# Patient Record
Sex: Female | Born: 1945
Health system: Southern US, Community
[De-identification: ages and names within clinical notes are randomized; demographics above are authoritative.]

## PROBLEM LIST (undated history)

## (undated) DIAGNOSIS — G43709 Chronic migraine without aura, not intractable, without status migrainosus: Secondary | ICD-10-CM

## (undated) DIAGNOSIS — F32A Depression, unspecified: Secondary | ICD-10-CM

## (undated) DIAGNOSIS — G629 Polyneuropathy, unspecified: Secondary | ICD-10-CM

## (undated) DIAGNOSIS — E079 Disorder of thyroid, unspecified: Secondary | ICD-10-CM

## (undated) DIAGNOSIS — M1990 Unspecified osteoarthritis, unspecified site: Secondary | ICD-10-CM

## (undated) DIAGNOSIS — M797 Fibromyalgia: Secondary | ICD-10-CM

## (undated) DIAGNOSIS — I10 Essential (primary) hypertension: Secondary | ICD-10-CM

## (undated) HISTORY — DX: Depression, unspecified: F32.A

## (undated) HISTORY — DX: Chronic migraine without aura, not intractable, without status migrainosus: G43.709

## (undated) HISTORY — DX: Disorder of thyroid, unspecified: E07.9

## (undated) HISTORY — PX: ROBOTIC ASSISTED LAP VAGINAL HYSTERECTOMY: SHX2362

## (undated) HISTORY — PX: APPENDECTOMY: SHX54

## (undated) HISTORY — DX: Unspecified osteoarthritis, unspecified site: M19.90

## (undated) HISTORY — PX: FRACTURE SURGERY: SHX138

## (undated) HISTORY — PX: KNEE SURGERY: SHX244

## (undated) HISTORY — PX: CHOLECYSTECTOMY: SHX55

## (undated) HISTORY — DX: Fibromyalgia: M79.7

## (undated) HISTORY — DX: Polyneuropathy, unspecified: G62.9

## (undated) HISTORY — PX: CERVICAL DISC SURGERY: SHX588

## (undated) HISTORY — DX: Essential (primary) hypertension: I10

---

## 2009-01-16 ENCOUNTER — Ambulatory Visit: Payer: Self-pay | Admitting: Endocrinology

## 2009-01-16 DIAGNOSIS — M5137 Other intervertebral disc degeneration, lumbosacral region: Secondary | ICD-10-CM

## 2009-01-16 DIAGNOSIS — R61 Generalized hyperhidrosis: Secondary | ICD-10-CM | POA: Insufficient documentation

## 2009-01-16 DIAGNOSIS — I10 Essential (primary) hypertension: Secondary | ICD-10-CM | POA: Insufficient documentation

## 2009-01-16 DIAGNOSIS — K219 Gastro-esophageal reflux disease without esophagitis: Secondary | ICD-10-CM | POA: Insufficient documentation

## 2009-01-16 DIAGNOSIS — F329 Major depressive disorder, single episode, unspecified: Secondary | ICD-10-CM

## 2009-01-16 DIAGNOSIS — F3289 Other specified depressive episodes: Secondary | ICD-10-CM

## 2009-01-16 DIAGNOSIS — F32A Depression, unspecified: Secondary | ICD-10-CM | POA: Insufficient documentation

## 2009-01-16 DIAGNOSIS — J309 Allergic rhinitis, unspecified: Secondary | ICD-10-CM

## 2009-01-16 DIAGNOSIS — R609 Edema, unspecified: Secondary | ICD-10-CM | POA: Insufficient documentation

## 2009-01-16 DIAGNOSIS — E876 Hypokalemia: Secondary | ICD-10-CM | POA: Insufficient documentation

## 2009-01-16 DIAGNOSIS — E039 Hypothyroidism, unspecified: Secondary | ICD-10-CM | POA: Insufficient documentation

## 2009-01-16 DIAGNOSIS — N959 Unspecified menopausal and perimenopausal disorder: Secondary | ICD-10-CM

## 2009-01-16 DIAGNOSIS — R413 Other amnesia: Secondary | ICD-10-CM | POA: Insufficient documentation

## 2009-01-16 DIAGNOSIS — M51379 Other intervertebral disc degeneration, lumbosacral region without mention of lumbar back pain or lower extremity pain: Secondary | ICD-10-CM

## 2009-01-16 HISTORY — DX: Other specified depressive episodes: F32.89

## 2009-01-16 HISTORY — DX: Other intervertebral disc degeneration, lumbosacral region without mention of lumbar back pain or lower extremity pain: M51.379

## 2009-01-16 HISTORY — DX: Generalized hyperhidrosis: R61

## 2009-01-16 HISTORY — DX: Edema, unspecified: R60.9

## 2009-01-16 HISTORY — DX: Hypothyroidism, unspecified: E03.9

## 2009-01-16 HISTORY — DX: Allergic rhinitis, unspecified: J30.9

## 2009-01-16 HISTORY — DX: Gastro-esophageal reflux disease without esophagitis: K21.9

## 2009-01-16 HISTORY — DX: Other intervertebral disc degeneration, lumbosacral region: M51.37

## 2009-01-16 HISTORY — DX: Hypokalemia: E87.6

## 2009-01-16 HISTORY — DX: Essential (primary) hypertension: I10

## 2009-01-16 HISTORY — DX: Other amnesia: R41.3

## 2009-01-16 HISTORY — DX: Unspecified menopausal and perimenopausal disorder: N95.9

## 2009-01-16 HISTORY — DX: Major depressive disorder, single episode, unspecified: F32.9

## 2009-01-16 LAB — CONVERTED CEMR LAB: TSH: 2.37 microintl units/mL (ref 0.35–5.50)

## 2009-01-21 ENCOUNTER — Encounter: Payer: Self-pay | Admitting: Endocrinology

## 2009-01-27 LAB — CONVERTED CEMR LAB
Metaneph Total, Ur: 248 ug/24hr (ref 224–832)
Normetanephrine, 24H Ur: 222 (ref 122–676)

## 2010-08-25 DIAGNOSIS — Z79899 Other long term (current) drug therapy: Secondary | ICD-10-CM

## 2010-08-25 DIAGNOSIS — G2581 Restless legs syndrome: Secondary | ICD-10-CM

## 2010-08-25 DIAGNOSIS — Z5181 Encounter for therapeutic drug level monitoring: Secondary | ICD-10-CM

## 2010-08-25 DIAGNOSIS — M797 Fibromyalgia: Secondary | ICD-10-CM | POA: Insufficient documentation

## 2010-08-25 HISTORY — DX: Restless legs syndrome: G25.81

## 2010-08-25 HISTORY — DX: Other long term (current) drug therapy: Z79.899

## 2010-08-25 HISTORY — DX: Encounter for therapeutic drug level monitoring: Z51.81

## 2010-10-09 DIAGNOSIS — M47817 Spondylosis without myelopathy or radiculopathy, lumbosacral region: Secondary | ICD-10-CM

## 2010-10-09 HISTORY — DX: Spondylosis without myelopathy or radiculopathy, lumbosacral region: M47.817

## 2010-11-20 DIAGNOSIS — M47814 Spondylosis without myelopathy or radiculopathy, thoracic region: Secondary | ICD-10-CM

## 2010-11-20 HISTORY — DX: Spondylosis without myelopathy or radiculopathy, thoracic region: M47.814

## 2012-02-23 DIAGNOSIS — Z79899 Other long term (current) drug therapy: Secondary | ICD-10-CM | POA: Diagnosis not present

## 2012-02-23 DIAGNOSIS — E039 Hypothyroidism, unspecified: Secondary | ICD-10-CM | POA: Diagnosis not present

## 2012-02-23 DIAGNOSIS — I10 Essential (primary) hypertension: Secondary | ICD-10-CM | POA: Diagnosis not present

## 2012-02-23 DIAGNOSIS — J309 Allergic rhinitis, unspecified: Secondary | ICD-10-CM | POA: Diagnosis not present

## 2012-02-23 DIAGNOSIS — E785 Hyperlipidemia, unspecified: Secondary | ICD-10-CM | POA: Diagnosis not present

## 2012-02-23 DIAGNOSIS — Z6830 Body mass index (BMI) 30.0-30.9, adult: Secondary | ICD-10-CM | POA: Diagnosis not present

## 2012-02-23 DIAGNOSIS — Z23 Encounter for immunization: Secondary | ICD-10-CM | POA: Diagnosis not present

## 2012-02-23 DIAGNOSIS — M1710 Unilateral primary osteoarthritis, unspecified knee: Secondary | ICD-10-CM | POA: Diagnosis not present

## 2012-03-08 DIAGNOSIS — IMO0001 Reserved for inherently not codable concepts without codable children: Secondary | ICD-10-CM | POA: Diagnosis not present

## 2012-03-08 DIAGNOSIS — M961 Postlaminectomy syndrome, not elsewhere classified: Secondary | ICD-10-CM | POA: Diagnosis not present

## 2012-03-08 DIAGNOSIS — M5137 Other intervertebral disc degeneration, lumbosacral region: Secondary | ICD-10-CM | POA: Diagnosis not present

## 2012-03-08 DIAGNOSIS — IMO0002 Reserved for concepts with insufficient information to code with codable children: Secondary | ICD-10-CM | POA: Diagnosis not present

## 2012-03-21 DIAGNOSIS — Z6830 Body mass index (BMI) 30.0-30.9, adult: Secondary | ICD-10-CM | POA: Diagnosis not present

## 2012-03-21 DIAGNOSIS — J019 Acute sinusitis, unspecified: Secondary | ICD-10-CM | POA: Diagnosis not present

## 2012-03-21 DIAGNOSIS — E871 Hypo-osmolality and hyponatremia: Secondary | ICD-10-CM | POA: Diagnosis not present

## 2012-03-21 DIAGNOSIS — J029 Acute pharyngitis, unspecified: Secondary | ICD-10-CM | POA: Diagnosis not present

## 2012-04-03 DIAGNOSIS — M1710 Unilateral primary osteoarthritis, unspecified knee: Secondary | ICD-10-CM | POA: Diagnosis not present

## 2012-04-03 DIAGNOSIS — IMO0002 Reserved for concepts with insufficient information to code with codable children: Secondary | ICD-10-CM | POA: Diagnosis not present

## 2012-04-03 DIAGNOSIS — J309 Allergic rhinitis, unspecified: Secondary | ICD-10-CM | POA: Diagnosis not present

## 2012-04-03 DIAGNOSIS — R609 Edema, unspecified: Secondary | ICD-10-CM | POA: Diagnosis not present

## 2012-04-03 DIAGNOSIS — Z6830 Body mass index (BMI) 30.0-30.9, adult: Secondary | ICD-10-CM | POA: Diagnosis not present

## 2012-04-20 DIAGNOSIS — M1710 Unilateral primary osteoarthritis, unspecified knee: Secondary | ICD-10-CM | POA: Diagnosis not present

## 2012-04-20 DIAGNOSIS — F411 Generalized anxiety disorder: Secondary | ICD-10-CM | POA: Diagnosis not present

## 2012-04-20 DIAGNOSIS — Z6831 Body mass index (BMI) 31.0-31.9, adult: Secondary | ICD-10-CM | POA: Diagnosis not present

## 2012-04-26 DIAGNOSIS — M2540 Effusion, unspecified joint: Secondary | ICD-10-CM | POA: Diagnosis not present

## 2012-04-26 DIAGNOSIS — S83289A Other tear of lateral meniscus, current injury, unspecified knee, initial encounter: Secondary | ICD-10-CM | POA: Diagnosis not present

## 2012-04-26 DIAGNOSIS — M1710 Unilateral primary osteoarthritis, unspecified knee: Secondary | ICD-10-CM | POA: Diagnosis not present

## 2012-04-26 DIAGNOSIS — IMO0002 Reserved for concepts with insufficient information to code with codable children: Secondary | ICD-10-CM | POA: Diagnosis not present

## 2012-05-10 DIAGNOSIS — J019 Acute sinusitis, unspecified: Secondary | ICD-10-CM | POA: Diagnosis not present

## 2012-05-10 DIAGNOSIS — F39 Unspecified mood [affective] disorder: Secondary | ICD-10-CM | POA: Diagnosis not present

## 2012-05-10 DIAGNOSIS — Z6831 Body mass index (BMI) 31.0-31.9, adult: Secondary | ICD-10-CM | POA: Diagnosis not present

## 2012-05-10 DIAGNOSIS — M1710 Unilateral primary osteoarthritis, unspecified knee: Secondary | ICD-10-CM | POA: Diagnosis not present

## 2012-05-10 DIAGNOSIS — M2550 Pain in unspecified joint: Secondary | ICD-10-CM | POA: Diagnosis not present

## 2012-05-24 DIAGNOSIS — Z01810 Encounter for preprocedural cardiovascular examination: Secondary | ICD-10-CM | POA: Diagnosis not present

## 2012-06-06 DIAGNOSIS — M25469 Effusion, unspecified knee: Secondary | ICD-10-CM | POA: Diagnosis not present

## 2012-06-06 DIAGNOSIS — R609 Edema, unspecified: Secondary | ICD-10-CM | POA: Diagnosis not present

## 2012-06-06 DIAGNOSIS — Z6831 Body mass index (BMI) 31.0-31.9, adult: Secondary | ICD-10-CM | POA: Diagnosis not present

## 2012-06-06 DIAGNOSIS — M1710 Unilateral primary osteoarthritis, unspecified knee: Secondary | ICD-10-CM | POA: Diagnosis not present

## 2012-06-06 DIAGNOSIS — M25569 Pain in unspecified knee: Secondary | ICD-10-CM | POA: Diagnosis not present

## 2012-06-06 DIAGNOSIS — IMO0002 Reserved for concepts with insufficient information to code with codable children: Secondary | ICD-10-CM | POA: Diagnosis not present

## 2012-06-13 DIAGNOSIS — M1710 Unilateral primary osteoarthritis, unspecified knee: Secondary | ICD-10-CM | POA: Diagnosis not present

## 2012-06-19 DIAGNOSIS — M25469 Effusion, unspecified knee: Secondary | ICD-10-CM | POA: Diagnosis not present

## 2012-06-19 DIAGNOSIS — IMO0002 Reserved for concepts with insufficient information to code with codable children: Secondary | ICD-10-CM | POA: Diagnosis not present

## 2012-06-19 DIAGNOSIS — M1710 Unilateral primary osteoarthritis, unspecified knee: Secondary | ICD-10-CM | POA: Diagnosis not present

## 2012-06-19 DIAGNOSIS — M25569 Pain in unspecified knee: Secondary | ICD-10-CM | POA: Diagnosis not present

## 2012-06-19 DIAGNOSIS — M7120 Synovial cyst of popliteal space [Baker], unspecified knee: Secondary | ICD-10-CM | POA: Diagnosis not present

## 2012-06-19 DIAGNOSIS — S83289A Other tear of lateral meniscus, current injury, unspecified knee, initial encounter: Secondary | ICD-10-CM | POA: Diagnosis not present

## 2012-06-20 DIAGNOSIS — IMO0001 Reserved for inherently not codable concepts without codable children: Secondary | ICD-10-CM | POA: Diagnosis not present

## 2012-06-20 DIAGNOSIS — M1710 Unilateral primary osteoarthritis, unspecified knee: Secondary | ICD-10-CM | POA: Diagnosis not present

## 2012-06-20 DIAGNOSIS — M79609 Pain in unspecified limb: Secondary | ICD-10-CM

## 2012-06-20 DIAGNOSIS — M51379 Other intervertebral disc degeneration, lumbosacral region without mention of lumbar back pain or lower extremity pain: Secondary | ICD-10-CM | POA: Diagnosis not present

## 2012-06-20 DIAGNOSIS — IMO0002 Reserved for concepts with insufficient information to code with codable children: Secondary | ICD-10-CM | POA: Diagnosis not present

## 2012-06-20 DIAGNOSIS — M5137 Other intervertebral disc degeneration, lumbosacral region: Secondary | ICD-10-CM | POA: Diagnosis not present

## 2012-06-20 HISTORY — DX: Pain in unspecified limb: M79.609

## 2012-07-10 DIAGNOSIS — Z01810 Encounter for preprocedural cardiovascular examination: Secondary | ICD-10-CM | POA: Diagnosis not present

## 2012-07-11 DIAGNOSIS — Z01818 Encounter for other preprocedural examination: Secondary | ICD-10-CM | POA: Diagnosis not present

## 2012-07-11 DIAGNOSIS — R238 Other skin changes: Secondary | ICD-10-CM | POA: Diagnosis not present

## 2012-07-21 DIAGNOSIS — E871 Hypo-osmolality and hyponatremia: Secondary | ICD-10-CM | POA: Diagnosis not present

## 2012-08-01 DIAGNOSIS — J209 Acute bronchitis, unspecified: Secondary | ICD-10-CM | POA: Diagnosis not present

## 2012-08-01 DIAGNOSIS — Z6833 Body mass index (BMI) 33.0-33.9, adult: Secondary | ICD-10-CM | POA: Diagnosis not present

## 2012-08-01 DIAGNOSIS — J029 Acute pharyngitis, unspecified: Secondary | ICD-10-CM | POA: Diagnosis not present

## 2012-08-01 DIAGNOSIS — J019 Acute sinusitis, unspecified: Secondary | ICD-10-CM | POA: Diagnosis not present

## 2012-08-01 DIAGNOSIS — R7989 Other specified abnormal findings of blood chemistry: Secondary | ICD-10-CM | POA: Diagnosis not present

## 2012-08-14 DIAGNOSIS — M1710 Unilateral primary osteoarthritis, unspecified knee: Secondary | ICD-10-CM | POA: Diagnosis not present

## 2012-08-14 DIAGNOSIS — Z01818 Encounter for other preprocedural examination: Secondary | ICD-10-CM | POA: Diagnosis not present

## 2012-08-18 DIAGNOSIS — M5137 Other intervertebral disc degeneration, lumbosacral region: Secondary | ICD-10-CM | POA: Diagnosis not present

## 2012-08-18 DIAGNOSIS — IMO0001 Reserved for inherently not codable concepts without codable children: Secondary | ICD-10-CM | POA: Diagnosis not present

## 2012-08-18 DIAGNOSIS — I10 Essential (primary) hypertension: Secondary | ICD-10-CM | POA: Diagnosis not present

## 2012-08-18 DIAGNOSIS — IMO0002 Reserved for concepts with insufficient information to code with codable children: Secondary | ICD-10-CM | POA: Diagnosis not present

## 2012-08-18 DIAGNOSIS — M961 Postlaminectomy syndrome, not elsewhere classified: Secondary | ICD-10-CM | POA: Diagnosis not present

## 2012-08-22 DIAGNOSIS — R799 Abnormal finding of blood chemistry, unspecified: Secondary | ICD-10-CM | POA: Diagnosis not present

## 2012-08-28 DIAGNOSIS — E039 Hypothyroidism, unspecified: Secondary | ICD-10-CM | POA: Diagnosis present

## 2012-08-28 DIAGNOSIS — Z90710 Acquired absence of both cervix and uterus: Secondary | ICD-10-CM | POA: Diagnosis not present

## 2012-08-28 DIAGNOSIS — G8929 Other chronic pain: Secondary | ICD-10-CM | POA: Diagnosis present

## 2012-08-28 DIAGNOSIS — M1710 Unilateral primary osteoarthritis, unspecified knee: Secondary | ICD-10-CM | POA: Diagnosis not present

## 2012-08-28 DIAGNOSIS — E663 Overweight: Secondary | ICD-10-CM | POA: Diagnosis present

## 2012-08-28 DIAGNOSIS — Z87891 Personal history of nicotine dependence: Secondary | ICD-10-CM | POA: Diagnosis not present

## 2012-08-28 DIAGNOSIS — Z8544 Personal history of malignant neoplasm of other female genital organs: Secondary | ICD-10-CM | POA: Diagnosis not present

## 2012-08-28 DIAGNOSIS — Z885 Allergy status to narcotic agent status: Secondary | ICD-10-CM | POA: Diagnosis not present

## 2012-08-28 DIAGNOSIS — IMO0002 Reserved for concepts with insufficient information to code with codable children: Secondary | ICD-10-CM | POA: Diagnosis not present

## 2012-08-28 DIAGNOSIS — Z8042 Family history of malignant neoplasm of prostate: Secondary | ICD-10-CM | POA: Diagnosis not present

## 2012-08-28 DIAGNOSIS — Z981 Arthrodesis status: Secondary | ICD-10-CM | POA: Diagnosis not present

## 2012-08-28 DIAGNOSIS — Z791 Long term (current) use of non-steroidal anti-inflammatories (NSAID): Secondary | ICD-10-CM | POA: Diagnosis not present

## 2012-08-28 DIAGNOSIS — Z23 Encounter for immunization: Secondary | ICD-10-CM | POA: Diagnosis not present

## 2012-08-28 DIAGNOSIS — G2581 Restless legs syndrome: Secondary | ICD-10-CM | POA: Diagnosis present

## 2012-08-28 DIAGNOSIS — Z6833 Body mass index (BMI) 33.0-33.9, adult: Secondary | ICD-10-CM | POA: Diagnosis not present

## 2012-08-28 DIAGNOSIS — Z86010 Personal history of colonic polyps: Secondary | ICD-10-CM | POA: Diagnosis not present

## 2012-08-28 DIAGNOSIS — I10 Essential (primary) hypertension: Secondary | ICD-10-CM | POA: Diagnosis present

## 2012-08-28 DIAGNOSIS — IMO0001 Reserved for inherently not codable concepts without codable children: Secondary | ICD-10-CM | POA: Diagnosis present

## 2012-08-31 DIAGNOSIS — Z86010 Personal history of colonic polyps: Secondary | ICD-10-CM | POA: Diagnosis not present

## 2012-08-31 DIAGNOSIS — G2581 Restless legs syndrome: Secondary | ICD-10-CM | POA: Diagnosis not present

## 2012-08-31 DIAGNOSIS — Z981 Arthrodesis status: Secondary | ICD-10-CM | POA: Diagnosis not present

## 2012-08-31 DIAGNOSIS — Z23 Encounter for immunization: Secondary | ICD-10-CM | POA: Diagnosis not present

## 2012-08-31 DIAGNOSIS — Z885 Allergy status to narcotic agent status: Secondary | ICD-10-CM | POA: Diagnosis not present

## 2012-08-31 DIAGNOSIS — Z8544 Personal history of malignant neoplasm of other female genital organs: Secondary | ICD-10-CM | POA: Diagnosis not present

## 2012-08-31 DIAGNOSIS — Z8042 Family history of malignant neoplasm of prostate: Secondary | ICD-10-CM | POA: Diagnosis not present

## 2012-08-31 DIAGNOSIS — IMO0001 Reserved for inherently not codable concepts without codable children: Secondary | ICD-10-CM | POA: Diagnosis not present

## 2012-08-31 DIAGNOSIS — E663 Overweight: Secondary | ICD-10-CM | POA: Diagnosis not present

## 2012-08-31 DIAGNOSIS — Z90710 Acquired absence of both cervix and uterus: Secondary | ICD-10-CM | POA: Diagnosis not present

## 2012-08-31 DIAGNOSIS — Z791 Long term (current) use of non-steroidal anti-inflammatories (NSAID): Secondary | ICD-10-CM | POA: Diagnosis not present

## 2012-08-31 DIAGNOSIS — Z87891 Personal history of nicotine dependence: Secondary | ICD-10-CM | POA: Diagnosis not present

## 2012-08-31 DIAGNOSIS — I10 Essential (primary) hypertension: Secondary | ICD-10-CM | POA: Diagnosis not present

## 2012-08-31 DIAGNOSIS — IMO0002 Reserved for concepts with insufficient information to code with codable children: Secondary | ICD-10-CM | POA: Diagnosis not present

## 2012-08-31 DIAGNOSIS — E039 Hypothyroidism, unspecified: Secondary | ICD-10-CM | POA: Diagnosis not present

## 2012-08-31 DIAGNOSIS — Z6833 Body mass index (BMI) 33.0-33.9, adult: Secondary | ICD-10-CM | POA: Diagnosis not present

## 2012-08-31 DIAGNOSIS — G8929 Other chronic pain: Secondary | ICD-10-CM | POA: Diagnosis not present

## 2012-09-01 DIAGNOSIS — IMO0001 Reserved for inherently not codable concepts without codable children: Secondary | ICD-10-CM | POA: Diagnosis not present

## 2012-09-01 DIAGNOSIS — Z7901 Long term (current) use of anticoagulants: Secondary | ICD-10-CM | POA: Diagnosis not present

## 2012-09-01 DIAGNOSIS — Z96659 Presence of unspecified artificial knee joint: Secondary | ICD-10-CM | POA: Diagnosis not present

## 2012-09-01 DIAGNOSIS — Z471 Aftercare following joint replacement surgery: Secondary | ICD-10-CM | POA: Diagnosis not present

## 2012-09-01 DIAGNOSIS — Z5181 Encounter for therapeutic drug level monitoring: Secondary | ICD-10-CM | POA: Diagnosis not present

## 2012-09-01 DIAGNOSIS — I10 Essential (primary) hypertension: Secondary | ICD-10-CM | POA: Diagnosis not present

## 2012-09-02 DIAGNOSIS — Z96659 Presence of unspecified artificial knee joint: Secondary | ICD-10-CM | POA: Diagnosis not present

## 2012-09-02 DIAGNOSIS — I10 Essential (primary) hypertension: Secondary | ICD-10-CM | POA: Diagnosis not present

## 2012-09-02 DIAGNOSIS — Z7901 Long term (current) use of anticoagulants: Secondary | ICD-10-CM | POA: Diagnosis not present

## 2012-09-02 DIAGNOSIS — Z5181 Encounter for therapeutic drug level monitoring: Secondary | ICD-10-CM | POA: Diagnosis not present

## 2012-09-02 DIAGNOSIS — Z471 Aftercare following joint replacement surgery: Secondary | ICD-10-CM | POA: Diagnosis not present

## 2012-09-02 DIAGNOSIS — IMO0001 Reserved for inherently not codable concepts without codable children: Secondary | ICD-10-CM | POA: Diagnosis not present

## 2012-09-03 DIAGNOSIS — Z96659 Presence of unspecified artificial knee joint: Secondary | ICD-10-CM | POA: Diagnosis not present

## 2012-09-03 DIAGNOSIS — I10 Essential (primary) hypertension: Secondary | ICD-10-CM | POA: Diagnosis not present

## 2012-09-03 DIAGNOSIS — Z5181 Encounter for therapeutic drug level monitoring: Secondary | ICD-10-CM | POA: Diagnosis not present

## 2012-09-03 DIAGNOSIS — IMO0001 Reserved for inherently not codable concepts without codable children: Secondary | ICD-10-CM | POA: Diagnosis not present

## 2012-09-03 DIAGNOSIS — Z471 Aftercare following joint replacement surgery: Secondary | ICD-10-CM | POA: Diagnosis not present

## 2012-09-03 DIAGNOSIS — Z7901 Long term (current) use of anticoagulants: Secondary | ICD-10-CM | POA: Diagnosis not present

## 2012-09-04 DIAGNOSIS — Z96659 Presence of unspecified artificial knee joint: Secondary | ICD-10-CM | POA: Diagnosis not present

## 2012-09-04 DIAGNOSIS — I10 Essential (primary) hypertension: Secondary | ICD-10-CM | POA: Diagnosis not present

## 2012-09-04 DIAGNOSIS — Z471 Aftercare following joint replacement surgery: Secondary | ICD-10-CM | POA: Diagnosis not present

## 2012-09-04 DIAGNOSIS — Z5181 Encounter for therapeutic drug level monitoring: Secondary | ICD-10-CM | POA: Diagnosis not present

## 2012-09-04 DIAGNOSIS — Z7901 Long term (current) use of anticoagulants: Secondary | ICD-10-CM | POA: Diagnosis not present

## 2012-09-04 DIAGNOSIS — IMO0001 Reserved for inherently not codable concepts without codable children: Secondary | ICD-10-CM | POA: Diagnosis not present

## 2012-09-05 DIAGNOSIS — Z471 Aftercare following joint replacement surgery: Secondary | ICD-10-CM | POA: Diagnosis not present

## 2012-09-05 DIAGNOSIS — Z96659 Presence of unspecified artificial knee joint: Secondary | ICD-10-CM | POA: Diagnosis not present

## 2012-09-05 DIAGNOSIS — Z7901 Long term (current) use of anticoagulants: Secondary | ICD-10-CM | POA: Diagnosis not present

## 2012-09-05 DIAGNOSIS — I10 Essential (primary) hypertension: Secondary | ICD-10-CM | POA: Diagnosis not present

## 2012-09-05 DIAGNOSIS — Z5181 Encounter for therapeutic drug level monitoring: Secondary | ICD-10-CM | POA: Diagnosis not present

## 2012-09-05 DIAGNOSIS — IMO0001 Reserved for inherently not codable concepts without codable children: Secondary | ICD-10-CM | POA: Diagnosis not present

## 2012-09-06 DIAGNOSIS — Z96659 Presence of unspecified artificial knee joint: Secondary | ICD-10-CM | POA: Diagnosis not present

## 2012-09-06 DIAGNOSIS — Z471 Aftercare following joint replacement surgery: Secondary | ICD-10-CM | POA: Diagnosis not present

## 2012-09-06 DIAGNOSIS — IMO0001 Reserved for inherently not codable concepts without codable children: Secondary | ICD-10-CM | POA: Diagnosis not present

## 2012-09-06 DIAGNOSIS — Z7901 Long term (current) use of anticoagulants: Secondary | ICD-10-CM | POA: Diagnosis not present

## 2012-09-06 DIAGNOSIS — I10 Essential (primary) hypertension: Secondary | ICD-10-CM | POA: Diagnosis not present

## 2012-09-06 DIAGNOSIS — Z5181 Encounter for therapeutic drug level monitoring: Secondary | ICD-10-CM | POA: Diagnosis not present

## 2012-09-07 DIAGNOSIS — Z5181 Encounter for therapeutic drug level monitoring: Secondary | ICD-10-CM | POA: Diagnosis not present

## 2012-09-07 DIAGNOSIS — Z471 Aftercare following joint replacement surgery: Secondary | ICD-10-CM | POA: Diagnosis not present

## 2012-09-07 DIAGNOSIS — I10 Essential (primary) hypertension: Secondary | ICD-10-CM | POA: Diagnosis not present

## 2012-09-07 DIAGNOSIS — Z7901 Long term (current) use of anticoagulants: Secondary | ICD-10-CM | POA: Diagnosis not present

## 2012-09-07 DIAGNOSIS — IMO0001 Reserved for inherently not codable concepts without codable children: Secondary | ICD-10-CM | POA: Diagnosis not present

## 2012-09-07 DIAGNOSIS — Z96659 Presence of unspecified artificial knee joint: Secondary | ICD-10-CM | POA: Diagnosis not present

## 2012-09-08 DIAGNOSIS — Z7901 Long term (current) use of anticoagulants: Secondary | ICD-10-CM | POA: Diagnosis not present

## 2012-09-08 DIAGNOSIS — Z5181 Encounter for therapeutic drug level monitoring: Secondary | ICD-10-CM | POA: Diagnosis not present

## 2012-09-08 DIAGNOSIS — Z96659 Presence of unspecified artificial knee joint: Secondary | ICD-10-CM | POA: Diagnosis not present

## 2012-09-08 DIAGNOSIS — IMO0001 Reserved for inherently not codable concepts without codable children: Secondary | ICD-10-CM | POA: Diagnosis not present

## 2012-09-08 DIAGNOSIS — I10 Essential (primary) hypertension: Secondary | ICD-10-CM | POA: Diagnosis not present

## 2012-09-08 DIAGNOSIS — Z471 Aftercare following joint replacement surgery: Secondary | ICD-10-CM | POA: Diagnosis not present

## 2012-09-09 DIAGNOSIS — M1990 Unspecified osteoarthritis, unspecified site: Secondary | ICD-10-CM | POA: Diagnosis not present

## 2012-09-09 DIAGNOSIS — R5382 Chronic fatigue, unspecified: Secondary | ICD-10-CM | POA: Diagnosis not present

## 2012-09-09 DIAGNOSIS — G8929 Other chronic pain: Secondary | ICD-10-CM | POA: Diagnosis present

## 2012-09-09 DIAGNOSIS — M79609 Pain in unspecified limb: Secondary | ICD-10-CM | POA: Diagnosis not present

## 2012-09-09 DIAGNOSIS — M25569 Pain in unspecified knee: Secondary | ICD-10-CM | POA: Diagnosis not present

## 2012-09-09 DIAGNOSIS — IMO0001 Reserved for inherently not codable concepts without codable children: Secondary | ICD-10-CM | POA: Diagnosis not present

## 2012-09-09 DIAGNOSIS — Z5189 Encounter for other specified aftercare: Secondary | ICD-10-CM | POA: Diagnosis not present

## 2012-09-09 DIAGNOSIS — Z79899 Other long term (current) drug therapy: Secondary | ICD-10-CM | POA: Diagnosis not present

## 2012-09-09 DIAGNOSIS — R52 Pain, unspecified: Secondary | ICD-10-CM | POA: Diagnosis not present

## 2012-09-09 DIAGNOSIS — I10 Essential (primary) hypertension: Secondary | ICD-10-CM | POA: Diagnosis not present

## 2012-09-09 DIAGNOSIS — R269 Unspecified abnormalities of gait and mobility: Secondary | ICD-10-CM | POA: Diagnosis not present

## 2012-09-09 DIAGNOSIS — I499 Cardiac arrhythmia, unspecified: Secondary | ICD-10-CM | POA: Diagnosis present

## 2012-09-09 DIAGNOSIS — Z885 Allergy status to narcotic agent status: Secondary | ICD-10-CM | POA: Diagnosis not present

## 2012-09-09 DIAGNOSIS — G8918 Other acute postprocedural pain: Secondary | ICD-10-CM | POA: Diagnosis not present

## 2012-09-09 DIAGNOSIS — I749 Embolism and thrombosis of unspecified artery: Secondary | ICD-10-CM | POA: Diagnosis not present

## 2012-09-09 DIAGNOSIS — D696 Thrombocytopenia, unspecified: Secondary | ICD-10-CM | POA: Diagnosis not present

## 2012-09-09 DIAGNOSIS — E039 Hypothyroidism, unspecified: Secondary | ICD-10-CM | POA: Diagnosis present

## 2012-09-09 DIAGNOSIS — R262 Difficulty in walking, not elsewhere classified: Secondary | ICD-10-CM | POA: Diagnosis not present

## 2012-09-09 DIAGNOSIS — M1710 Unilateral primary osteoarthritis, unspecified knee: Secondary | ICD-10-CM | POA: Diagnosis present

## 2012-09-09 DIAGNOSIS — Z981 Arthrodesis status: Secondary | ICD-10-CM | POA: Diagnosis not present

## 2012-09-09 DIAGNOSIS — Z7901 Long term (current) use of anticoagulants: Secondary | ICD-10-CM | POA: Diagnosis not present

## 2012-09-09 DIAGNOSIS — Z90710 Acquired absence of both cervix and uterus: Secondary | ICD-10-CM | POA: Diagnosis not present

## 2012-09-09 DIAGNOSIS — M545 Low back pain: Secondary | ICD-10-CM | POA: Diagnosis not present

## 2012-09-09 DIAGNOSIS — I824Z9 Acute embolism and thrombosis of unspecified deep veins of unspecified distal lower extremity: Secondary | ICD-10-CM | POA: Diagnosis not present

## 2012-09-09 DIAGNOSIS — Z96659 Presence of unspecified artificial knee joint: Secondary | ICD-10-CM | POA: Diagnosis not present

## 2012-09-09 DIAGNOSIS — I82409 Acute embolism and thrombosis of unspecified deep veins of unspecified lower extremity: Secondary | ICD-10-CM | POA: Diagnosis not present

## 2012-09-09 DIAGNOSIS — M6281 Muscle weakness (generalized): Secondary | ICD-10-CM | POA: Diagnosis not present

## 2012-09-12 DIAGNOSIS — I10 Essential (primary) hypertension: Secondary | ICD-10-CM | POA: Diagnosis not present

## 2012-09-12 DIAGNOSIS — R5382 Chronic fatigue, unspecified: Secondary | ICD-10-CM | POA: Diagnosis not present

## 2012-09-12 DIAGNOSIS — F329 Major depressive disorder, single episode, unspecified: Secondary | ICD-10-CM | POA: Diagnosis not present

## 2012-09-12 DIAGNOSIS — M79609 Pain in unspecified limb: Secondary | ICD-10-CM | POA: Diagnosis not present

## 2012-09-12 DIAGNOSIS — M6281 Muscle weakness (generalized): Secondary | ICD-10-CM | POA: Diagnosis not present

## 2012-09-12 DIAGNOSIS — I499 Cardiac arrhythmia, unspecified: Secondary | ICD-10-CM | POA: Diagnosis not present

## 2012-09-12 DIAGNOSIS — D696 Thrombocytopenia, unspecified: Secondary | ICD-10-CM | POA: Diagnosis not present

## 2012-09-12 DIAGNOSIS — G8929 Other chronic pain: Secondary | ICD-10-CM | POA: Diagnosis not present

## 2012-09-12 DIAGNOSIS — R262 Difficulty in walking, not elsewhere classified: Secondary | ICD-10-CM | POA: Diagnosis not present

## 2012-09-12 DIAGNOSIS — IMO0001 Reserved for inherently not codable concepts without codable children: Secondary | ICD-10-CM | POA: Diagnosis not present

## 2012-09-12 DIAGNOSIS — R269 Unspecified abnormalities of gait and mobility: Secondary | ICD-10-CM | POA: Diagnosis not present

## 2012-09-12 DIAGNOSIS — M545 Low back pain: Secondary | ICD-10-CM | POA: Diagnosis not present

## 2012-09-12 DIAGNOSIS — Z96659 Presence of unspecified artificial knee joint: Secondary | ICD-10-CM | POA: Diagnosis not present

## 2012-09-12 DIAGNOSIS — E782 Mixed hyperlipidemia: Secondary | ICD-10-CM | POA: Diagnosis not present

## 2012-09-12 DIAGNOSIS — E039 Hypothyroidism, unspecified: Secondary | ICD-10-CM | POA: Diagnosis not present

## 2012-09-12 DIAGNOSIS — Z7901 Long term (current) use of anticoagulants: Secondary | ICD-10-CM | POA: Diagnosis not present

## 2012-09-12 DIAGNOSIS — I824Z9 Acute embolism and thrombosis of unspecified deep veins of unspecified distal lower extremity: Secondary | ICD-10-CM | POA: Diagnosis not present

## 2012-09-12 DIAGNOSIS — M25569 Pain in unspecified knee: Secondary | ICD-10-CM | POA: Diagnosis not present

## 2012-09-12 DIAGNOSIS — Z79899 Other long term (current) drug therapy: Secondary | ICD-10-CM | POA: Diagnosis not present

## 2012-09-12 DIAGNOSIS — M1990 Unspecified osteoarthritis, unspecified site: Secondary | ICD-10-CM | POA: Diagnosis not present

## 2012-09-12 DIAGNOSIS — Z5189 Encounter for other specified aftercare: Secondary | ICD-10-CM | POA: Diagnosis not present

## 2012-09-12 DIAGNOSIS — I749 Embolism and thrombosis of unspecified artery: Secondary | ICD-10-CM | POA: Diagnosis not present

## 2012-09-14 DIAGNOSIS — E782 Mixed hyperlipidemia: Secondary | ICD-10-CM | POA: Diagnosis not present

## 2012-09-14 DIAGNOSIS — I10 Essential (primary) hypertension: Secondary | ICD-10-CM | POA: Diagnosis not present

## 2012-09-14 DIAGNOSIS — F329 Major depressive disorder, single episode, unspecified: Secondary | ICD-10-CM | POA: Diagnosis not present

## 2012-09-21 DIAGNOSIS — IMO0001 Reserved for inherently not codable concepts without codable children: Secondary | ICD-10-CM | POA: Diagnosis not present

## 2012-09-21 DIAGNOSIS — Z471 Aftercare following joint replacement surgery: Secondary | ICD-10-CM | POA: Diagnosis not present

## 2012-09-21 DIAGNOSIS — Z5181 Encounter for therapeutic drug level monitoring: Secondary | ICD-10-CM | POA: Diagnosis not present

## 2012-09-21 DIAGNOSIS — Z96659 Presence of unspecified artificial knee joint: Secondary | ICD-10-CM | POA: Diagnosis not present

## 2012-09-21 DIAGNOSIS — I10 Essential (primary) hypertension: Secondary | ICD-10-CM | POA: Diagnosis not present

## 2012-09-21 DIAGNOSIS — Z7901 Long term (current) use of anticoagulants: Secondary | ICD-10-CM | POA: Diagnosis not present

## 2012-09-22 DIAGNOSIS — I10 Essential (primary) hypertension: Secondary | ICD-10-CM | POA: Diagnosis not present

## 2012-09-22 DIAGNOSIS — Z7901 Long term (current) use of anticoagulants: Secondary | ICD-10-CM | POA: Diagnosis not present

## 2012-09-22 DIAGNOSIS — IMO0001 Reserved for inherently not codable concepts without codable children: Secondary | ICD-10-CM | POA: Diagnosis not present

## 2012-09-22 DIAGNOSIS — Z471 Aftercare following joint replacement surgery: Secondary | ICD-10-CM | POA: Diagnosis not present

## 2012-09-22 DIAGNOSIS — Z96659 Presence of unspecified artificial knee joint: Secondary | ICD-10-CM | POA: Diagnosis not present

## 2012-09-22 DIAGNOSIS — Z5181 Encounter for therapeutic drug level monitoring: Secondary | ICD-10-CM | POA: Diagnosis not present

## 2012-09-24 DIAGNOSIS — I10 Essential (primary) hypertension: Secondary | ICD-10-CM | POA: Diagnosis not present

## 2012-09-24 DIAGNOSIS — Z96659 Presence of unspecified artificial knee joint: Secondary | ICD-10-CM | POA: Diagnosis not present

## 2012-09-24 DIAGNOSIS — IMO0001 Reserved for inherently not codable concepts without codable children: Secondary | ICD-10-CM | POA: Diagnosis not present

## 2012-09-24 DIAGNOSIS — Z5181 Encounter for therapeutic drug level monitoring: Secondary | ICD-10-CM | POA: Diagnosis not present

## 2012-09-24 DIAGNOSIS — Z7901 Long term (current) use of anticoagulants: Secondary | ICD-10-CM | POA: Diagnosis not present

## 2012-09-24 DIAGNOSIS — Z471 Aftercare following joint replacement surgery: Secondary | ICD-10-CM | POA: Diagnosis not present

## 2012-09-26 DIAGNOSIS — Z7901 Long term (current) use of anticoagulants: Secondary | ICD-10-CM | POA: Diagnosis not present

## 2012-09-26 DIAGNOSIS — Z471 Aftercare following joint replacement surgery: Secondary | ICD-10-CM | POA: Diagnosis not present

## 2012-09-26 DIAGNOSIS — Z5181 Encounter for therapeutic drug level monitoring: Secondary | ICD-10-CM | POA: Diagnosis not present

## 2012-09-26 DIAGNOSIS — Z96659 Presence of unspecified artificial knee joint: Secondary | ICD-10-CM | POA: Diagnosis not present

## 2012-09-26 DIAGNOSIS — I10 Essential (primary) hypertension: Secondary | ICD-10-CM | POA: Diagnosis not present

## 2012-09-26 DIAGNOSIS — IMO0001 Reserved for inherently not codable concepts without codable children: Secondary | ICD-10-CM | POA: Diagnosis not present

## 2012-09-27 DIAGNOSIS — I10 Essential (primary) hypertension: Secondary | ICD-10-CM | POA: Diagnosis not present

## 2012-09-27 DIAGNOSIS — Z471 Aftercare following joint replacement surgery: Secondary | ICD-10-CM | POA: Diagnosis not present

## 2012-09-27 DIAGNOSIS — Z5181 Encounter for therapeutic drug level monitoring: Secondary | ICD-10-CM | POA: Diagnosis not present

## 2012-09-27 DIAGNOSIS — IMO0001 Reserved for inherently not codable concepts without codable children: Secondary | ICD-10-CM | POA: Diagnosis not present

## 2012-09-27 DIAGNOSIS — Z7901 Long term (current) use of anticoagulants: Secondary | ICD-10-CM | POA: Diagnosis not present

## 2012-09-27 DIAGNOSIS — Z96659 Presence of unspecified artificial knee joint: Secondary | ICD-10-CM | POA: Diagnosis not present

## 2012-10-02 DIAGNOSIS — R269 Unspecified abnormalities of gait and mobility: Secondary | ICD-10-CM | POA: Diagnosis not present

## 2012-10-02 DIAGNOSIS — M6281 Muscle weakness (generalized): Secondary | ICD-10-CM | POA: Diagnosis not present

## 2012-10-02 DIAGNOSIS — M1710 Unilateral primary osteoarthritis, unspecified knee: Secondary | ICD-10-CM | POA: Diagnosis not present

## 2012-10-03 DIAGNOSIS — D473 Essential (hemorrhagic) thrombocythemia: Secondary | ICD-10-CM | POA: Diagnosis not present

## 2012-10-03 DIAGNOSIS — I10 Essential (primary) hypertension: Secondary | ICD-10-CM | POA: Diagnosis not present

## 2012-10-03 DIAGNOSIS — Z7901 Long term (current) use of anticoagulants: Secondary | ICD-10-CM | POA: Diagnosis not present

## 2012-10-05 DIAGNOSIS — M1710 Unilateral primary osteoarthritis, unspecified knee: Secondary | ICD-10-CM | POA: Diagnosis not present

## 2012-10-11 DIAGNOSIS — R791 Abnormal coagulation profile: Secondary | ICD-10-CM | POA: Diagnosis not present

## 2012-10-11 DIAGNOSIS — I82409 Acute embolism and thrombosis of unspecified deep veins of unspecified lower extremity: Secondary | ICD-10-CM | POA: Diagnosis not present

## 2012-10-11 DIAGNOSIS — D473 Essential (hemorrhagic) thrombocythemia: Secondary | ICD-10-CM | POA: Diagnosis not present

## 2012-10-17 DIAGNOSIS — Z1231 Encounter for screening mammogram for malignant neoplasm of breast: Secondary | ICD-10-CM | POA: Diagnosis not present

## 2012-10-19 DIAGNOSIS — R791 Abnormal coagulation profile: Secondary | ICD-10-CM | POA: Diagnosis not present

## 2012-10-25 DIAGNOSIS — M1710 Unilateral primary osteoarthritis, unspecified knee: Secondary | ICD-10-CM | POA: Diagnosis not present

## 2012-10-25 DIAGNOSIS — M6281 Muscle weakness (generalized): Secondary | ICD-10-CM | POA: Diagnosis not present

## 2012-10-25 DIAGNOSIS — R269 Unspecified abnormalities of gait and mobility: Secondary | ICD-10-CM | POA: Diagnosis not present

## 2012-10-30 DIAGNOSIS — M1710 Unilateral primary osteoarthritis, unspecified knee: Secondary | ICD-10-CM | POA: Diagnosis not present

## 2012-10-30 DIAGNOSIS — R269 Unspecified abnormalities of gait and mobility: Secondary | ICD-10-CM | POA: Diagnosis not present

## 2012-10-30 DIAGNOSIS — M6281 Muscle weakness (generalized): Secondary | ICD-10-CM | POA: Diagnosis not present

## 2012-11-13 DIAGNOSIS — IMO0001 Reserved for inherently not codable concepts without codable children: Secondary | ICD-10-CM | POA: Diagnosis not present

## 2012-11-13 DIAGNOSIS — IMO0002 Reserved for concepts with insufficient information to code with codable children: Secondary | ICD-10-CM | POA: Diagnosis not present

## 2012-11-13 DIAGNOSIS — M961 Postlaminectomy syndrome, not elsewhere classified: Secondary | ICD-10-CM | POA: Diagnosis not present

## 2012-11-13 DIAGNOSIS — M5137 Other intervertebral disc degeneration, lumbosacral region: Secondary | ICD-10-CM | POA: Diagnosis not present

## 2012-11-13 DIAGNOSIS — M51379 Other intervertebral disc degeneration, lumbosacral region without mention of lumbar back pain or lower extremity pain: Secondary | ICD-10-CM | POA: Diagnosis not present

## 2012-11-21 DIAGNOSIS — M1710 Unilateral primary osteoarthritis, unspecified knee: Secondary | ICD-10-CM | POA: Diagnosis not present

## 2012-11-21 DIAGNOSIS — M6281 Muscle weakness (generalized): Secondary | ICD-10-CM | POA: Diagnosis not present

## 2012-11-21 DIAGNOSIS — R269 Unspecified abnormalities of gait and mobility: Secondary | ICD-10-CM | POA: Diagnosis not present

## 2012-11-23 DIAGNOSIS — E538 Deficiency of other specified B group vitamins: Secondary | ICD-10-CM | POA: Diagnosis not present

## 2012-11-23 DIAGNOSIS — Z0000 Encounter for general adult medical examination without abnormal findings: Secondary | ICD-10-CM | POA: Diagnosis not present

## 2012-11-23 DIAGNOSIS — I10 Essential (primary) hypertension: Secondary | ICD-10-CM | POA: Diagnosis not present

## 2012-11-23 DIAGNOSIS — Z79899 Other long term (current) drug therapy: Secondary | ICD-10-CM | POA: Diagnosis not present

## 2012-11-23 DIAGNOSIS — R791 Abnormal coagulation profile: Secondary | ICD-10-CM | POA: Diagnosis not present

## 2012-11-23 DIAGNOSIS — E559 Vitamin D deficiency, unspecified: Secondary | ICD-10-CM | POA: Diagnosis not present

## 2012-11-23 DIAGNOSIS — I82409 Acute embolism and thrombosis of unspecified deep veins of unspecified lower extremity: Secondary | ICD-10-CM | POA: Diagnosis not present

## 2012-11-24 DIAGNOSIS — R269 Unspecified abnormalities of gait and mobility: Secondary | ICD-10-CM | POA: Diagnosis not present

## 2012-11-24 DIAGNOSIS — M6281 Muscle weakness (generalized): Secondary | ICD-10-CM | POA: Diagnosis not present

## 2012-11-24 DIAGNOSIS — M1710 Unilateral primary osteoarthritis, unspecified knee: Secondary | ICD-10-CM | POA: Diagnosis not present

## 2012-11-27 DIAGNOSIS — M1710 Unilateral primary osteoarthritis, unspecified knee: Secondary | ICD-10-CM | POA: Diagnosis not present

## 2012-12-04 DIAGNOSIS — H68009 Unspecified Eustachian salpingitis, unspecified ear: Secondary | ICD-10-CM | POA: Diagnosis not present

## 2012-12-04 DIAGNOSIS — M722 Plantar fascial fibromatosis: Secondary | ICD-10-CM | POA: Diagnosis not present

## 2012-12-04 DIAGNOSIS — R5381 Other malaise: Secondary | ICD-10-CM | POA: Diagnosis not present

## 2012-12-04 DIAGNOSIS — J309 Allergic rhinitis, unspecified: Secondary | ICD-10-CM | POA: Diagnosis not present

## 2012-12-04 DIAGNOSIS — R110 Nausea: Secondary | ICD-10-CM | POA: Diagnosis not present

## 2012-12-07 DIAGNOSIS — M2010 Hallux valgus (acquired), unspecified foot: Secondary | ICD-10-CM | POA: Diagnosis not present

## 2012-12-07 DIAGNOSIS — M722 Plantar fascial fibromatosis: Secondary | ICD-10-CM | POA: Diagnosis not present

## 2013-01-10 DIAGNOSIS — R1310 Dysphagia, unspecified: Secondary | ICD-10-CM | POA: Diagnosis not present

## 2013-01-10 DIAGNOSIS — Z86010 Personal history of colonic polyps: Secondary | ICD-10-CM | POA: Diagnosis not present

## 2013-01-10 DIAGNOSIS — K5900 Constipation, unspecified: Secondary | ICD-10-CM | POA: Diagnosis not present

## 2013-01-10 DIAGNOSIS — K219 Gastro-esophageal reflux disease without esophagitis: Secondary | ICD-10-CM | POA: Diagnosis not present

## 2013-01-23 DIAGNOSIS — K449 Diaphragmatic hernia without obstruction or gangrene: Secondary | ICD-10-CM | POA: Diagnosis not present

## 2013-01-23 DIAGNOSIS — IMO0001 Reserved for inherently not codable concepts without codable children: Secondary | ICD-10-CM | POA: Diagnosis not present

## 2013-01-23 DIAGNOSIS — Z860100 Personal history of colon polyps, unspecified: Secondary | ICD-10-CM | POA: Diagnosis not present

## 2013-01-23 DIAGNOSIS — D126 Benign neoplasm of colon, unspecified: Secondary | ICD-10-CM | POA: Diagnosis not present

## 2013-01-23 DIAGNOSIS — Z8542 Personal history of malignant neoplasm of other parts of uterus: Secondary | ICD-10-CM | POA: Diagnosis not present

## 2013-01-23 DIAGNOSIS — K648 Other hemorrhoids: Secondary | ICD-10-CM | POA: Diagnosis not present

## 2013-01-23 DIAGNOSIS — I10 Essential (primary) hypertension: Secondary | ICD-10-CM | POA: Diagnosis not present

## 2013-01-23 DIAGNOSIS — Z1211 Encounter for screening for malignant neoplasm of colon: Secondary | ICD-10-CM | POA: Diagnosis not present

## 2013-01-23 DIAGNOSIS — K3189 Other diseases of stomach and duodenum: Secondary | ICD-10-CM | POA: Diagnosis not present

## 2013-01-23 DIAGNOSIS — R1310 Dysphagia, unspecified: Secondary | ICD-10-CM | POA: Diagnosis not present

## 2013-01-23 DIAGNOSIS — E079 Disorder of thyroid, unspecified: Secondary | ICD-10-CM | POA: Diagnosis not present

## 2013-02-01 DIAGNOSIS — J309 Allergic rhinitis, unspecified: Secondary | ICD-10-CM | POA: Diagnosis not present

## 2013-02-01 DIAGNOSIS — Z23 Encounter for immunization: Secondary | ICD-10-CM | POA: Diagnosis not present

## 2013-02-01 DIAGNOSIS — Z9181 History of falling: Secondary | ICD-10-CM | POA: Diagnosis not present

## 2013-02-01 DIAGNOSIS — M79609 Pain in unspecified limb: Secondary | ICD-10-CM | POA: Diagnosis not present

## 2013-02-15 DIAGNOSIS — Z9889 Other specified postprocedural states: Secondary | ICD-10-CM | POA: Diagnosis not present

## 2013-02-15 DIAGNOSIS — R112 Nausea with vomiting, unspecified: Secondary | ICD-10-CM | POA: Diagnosis not present

## 2013-02-15 DIAGNOSIS — N951 Menopausal and female climacteric states: Secondary | ICD-10-CM | POA: Diagnosis not present

## 2013-02-15 DIAGNOSIS — K449 Diaphragmatic hernia without obstruction or gangrene: Secondary | ICD-10-CM | POA: Diagnosis not present

## 2013-02-16 DIAGNOSIS — IMO0001 Reserved for inherently not codable concepts without codable children: Secondary | ICD-10-CM | POA: Diagnosis not present

## 2013-02-16 DIAGNOSIS — M961 Postlaminectomy syndrome, not elsewhere classified: Secondary | ICD-10-CM | POA: Diagnosis not present

## 2013-02-16 DIAGNOSIS — IMO0002 Reserved for concepts with insufficient information to code with codable children: Secondary | ICD-10-CM | POA: Diagnosis not present

## 2013-02-16 DIAGNOSIS — M546 Pain in thoracic spine: Secondary | ICD-10-CM | POA: Diagnosis not present

## 2013-02-16 DIAGNOSIS — M5137 Other intervertebral disc degeneration, lumbosacral region: Secondary | ICD-10-CM | POA: Diagnosis not present

## 2013-02-16 DIAGNOSIS — M533 Sacrococcygeal disorders, not elsewhere classified: Secondary | ICD-10-CM | POA: Diagnosis not present

## 2013-02-26 DIAGNOSIS — R1310 Dysphagia, unspecified: Secondary | ICD-10-CM | POA: Diagnosis not present

## 2013-02-26 DIAGNOSIS — K219 Gastro-esophageal reflux disease without esophagitis: Secondary | ICD-10-CM | POA: Diagnosis not present

## 2013-02-26 DIAGNOSIS — Z86010 Personal history of colonic polyps: Secondary | ICD-10-CM | POA: Diagnosis not present

## 2013-02-26 DIAGNOSIS — K5900 Constipation, unspecified: Secondary | ICD-10-CM | POA: Diagnosis not present

## 2013-03-05 DIAGNOSIS — K222 Esophageal obstruction: Secondary | ICD-10-CM | POA: Diagnosis not present

## 2013-03-05 DIAGNOSIS — K449 Diaphragmatic hernia without obstruction or gangrene: Secondary | ICD-10-CM | POA: Diagnosis not present

## 2013-03-05 DIAGNOSIS — R1310 Dysphagia, unspecified: Secondary | ICD-10-CM | POA: Diagnosis not present

## 2013-03-13 DIAGNOSIS — K5900 Constipation, unspecified: Secondary | ICD-10-CM | POA: Diagnosis not present

## 2013-03-13 DIAGNOSIS — R110 Nausea: Secondary | ICD-10-CM | POA: Diagnosis not present

## 2013-03-27 DIAGNOSIS — S20219A Contusion of unspecified front wall of thorax, initial encounter: Secondary | ICD-10-CM | POA: Diagnosis not present

## 2013-03-27 DIAGNOSIS — R071 Chest pain on breathing: Secondary | ICD-10-CM | POA: Diagnosis not present

## 2013-03-27 DIAGNOSIS — W010XXA Fall on same level from slipping, tripping and stumbling without subsequent striking against object, initial encounter: Secondary | ICD-10-CM | POA: Diagnosis not present

## 2013-03-27 DIAGNOSIS — S298XXA Other specified injuries of thorax, initial encounter: Secondary | ICD-10-CM | POA: Diagnosis not present

## 2013-03-28 DIAGNOSIS — Z86010 Personal history of colonic polyps: Secondary | ICD-10-CM | POA: Diagnosis not present

## 2013-03-28 DIAGNOSIS — R1310 Dysphagia, unspecified: Secondary | ICD-10-CM | POA: Diagnosis not present

## 2013-03-28 DIAGNOSIS — K5900 Constipation, unspecified: Secondary | ICD-10-CM | POA: Diagnosis not present

## 2013-03-28 DIAGNOSIS — K219 Gastro-esophageal reflux disease without esophagitis: Secondary | ICD-10-CM | POA: Diagnosis not present

## 2013-04-02 DIAGNOSIS — J019 Acute sinusitis, unspecified: Secondary | ICD-10-CM | POA: Diagnosis not present

## 2013-04-02 DIAGNOSIS — K219 Gastro-esophageal reflux disease without esophagitis: Secondary | ICD-10-CM | POA: Diagnosis not present

## 2013-04-02 DIAGNOSIS — J309 Allergic rhinitis, unspecified: Secondary | ICD-10-CM | POA: Diagnosis not present

## 2013-04-02 DIAGNOSIS — I10 Essential (primary) hypertension: Secondary | ICD-10-CM | POA: Diagnosis not present

## 2013-05-07 DIAGNOSIS — M1710 Unilateral primary osteoarthritis, unspecified knee: Secondary | ICD-10-CM | POA: Diagnosis not present

## 2013-05-07 DIAGNOSIS — N760 Acute vaginitis: Secondary | ICD-10-CM | POA: Diagnosis not present

## 2013-05-07 DIAGNOSIS — IMO0002 Reserved for concepts with insufficient information to code with codable children: Secondary | ICD-10-CM | POA: Diagnosis not present

## 2013-05-18 DIAGNOSIS — M546 Pain in thoracic spine: Secondary | ICD-10-CM | POA: Diagnosis not present

## 2013-05-18 DIAGNOSIS — IMO0001 Reserved for inherently not codable concepts without codable children: Secondary | ICD-10-CM | POA: Diagnosis not present

## 2013-05-18 DIAGNOSIS — M533 Sacrococcygeal disorders, not elsewhere classified: Secondary | ICD-10-CM | POA: Diagnosis not present

## 2013-05-18 DIAGNOSIS — M5137 Other intervertebral disc degeneration, lumbosacral region: Secondary | ICD-10-CM | POA: Diagnosis not present

## 2013-05-18 DIAGNOSIS — IMO0002 Reserved for concepts with insufficient information to code with codable children: Secondary | ICD-10-CM | POA: Diagnosis not present

## 2013-05-18 DIAGNOSIS — M51379 Other intervertebral disc degeneration, lumbosacral region without mention of lumbar back pain or lower extremity pain: Secondary | ICD-10-CM | POA: Diagnosis not present

## 2013-07-05 DIAGNOSIS — E039 Hypothyroidism, unspecified: Secondary | ICD-10-CM | POA: Diagnosis not present

## 2013-07-05 DIAGNOSIS — E038 Other specified hypothyroidism: Secondary | ICD-10-CM | POA: Diagnosis not present

## 2013-07-05 DIAGNOSIS — Z79899 Other long term (current) drug therapy: Secondary | ICD-10-CM | POA: Diagnosis not present

## 2013-07-05 DIAGNOSIS — I10 Essential (primary) hypertension: Secondary | ICD-10-CM | POA: Diagnosis not present

## 2013-07-05 DIAGNOSIS — E782 Mixed hyperlipidemia: Secondary | ICD-10-CM | POA: Diagnosis not present

## 2013-07-05 DIAGNOSIS — E559 Vitamin D deficiency, unspecified: Secondary | ICD-10-CM | POA: Diagnosis not present

## 2013-07-05 DIAGNOSIS — J309 Allergic rhinitis, unspecified: Secondary | ICD-10-CM | POA: Diagnosis not present

## 2013-07-09 DIAGNOSIS — M5137 Other intervertebral disc degeneration, lumbosacral region: Secondary | ICD-10-CM | POA: Diagnosis not present

## 2013-07-09 DIAGNOSIS — IMO0001 Reserved for inherently not codable concepts without codable children: Secondary | ICD-10-CM | POA: Diagnosis not present

## 2013-07-09 DIAGNOSIS — M129 Arthropathy, unspecified: Secondary | ICD-10-CM | POA: Diagnosis not present

## 2013-07-09 DIAGNOSIS — M533 Sacrococcygeal disorders, not elsewhere classified: Secondary | ICD-10-CM | POA: Diagnosis not present

## 2013-07-09 DIAGNOSIS — M546 Pain in thoracic spine: Secondary | ICD-10-CM | POA: Diagnosis not present

## 2013-07-16 DIAGNOSIS — M1710 Unilateral primary osteoarthritis, unspecified knee: Secondary | ICD-10-CM | POA: Diagnosis not present

## 2013-07-16 DIAGNOSIS — IMO0002 Reserved for concepts with insufficient information to code with codable children: Secondary | ICD-10-CM | POA: Diagnosis not present

## 2013-07-16 DIAGNOSIS — N23 Unspecified renal colic: Secondary | ICD-10-CM | POA: Diagnosis not present

## 2013-07-16 DIAGNOSIS — N949 Unspecified condition associated with female genital organs and menstrual cycle: Secondary | ICD-10-CM | POA: Diagnosis not present

## 2013-07-16 DIAGNOSIS — N343 Urethral syndrome, unspecified: Secondary | ICD-10-CM | POA: Diagnosis not present

## 2013-07-25 DIAGNOSIS — N23 Unspecified renal colic: Secondary | ICD-10-CM | POA: Diagnosis not present

## 2013-07-25 DIAGNOSIS — N949 Unspecified condition associated with female genital organs and menstrual cycle: Secondary | ICD-10-CM | POA: Diagnosis not present

## 2013-07-30 DIAGNOSIS — M797 Fibromyalgia: Secondary | ICD-10-CM | POA: Insufficient documentation

## 2013-07-30 DIAGNOSIS — M533 Sacrococcygeal disorders, not elsewhere classified: Secondary | ICD-10-CM | POA: Diagnosis not present

## 2013-07-30 DIAGNOSIS — M546 Pain in thoracic spine: Secondary | ICD-10-CM

## 2013-07-30 DIAGNOSIS — M961 Postlaminectomy syndrome, not elsewhere classified: Secondary | ICD-10-CM | POA: Insufficient documentation

## 2013-07-30 DIAGNOSIS — M1990 Unspecified osteoarthritis, unspecified site: Secondary | ICD-10-CM | POA: Insufficient documentation

## 2013-07-30 DIAGNOSIS — M0580 Other rheumatoid arthritis with rheumatoid factor of unspecified site: Secondary | ICD-10-CM | POA: Insufficient documentation

## 2013-07-30 DIAGNOSIS — M5137 Other intervertebral disc degeneration, lumbosacral region: Secondary | ICD-10-CM | POA: Diagnosis not present

## 2013-07-30 DIAGNOSIS — C55 Malignant neoplasm of uterus, part unspecified: Secondary | ICD-10-CM

## 2013-07-30 DIAGNOSIS — G894 Chronic pain syndrome: Secondary | ICD-10-CM | POA: Insufficient documentation

## 2013-07-30 DIAGNOSIS — M129 Arthropathy, unspecified: Secondary | ICD-10-CM | POA: Diagnosis not present

## 2013-07-30 DIAGNOSIS — S0300XA Dislocation of jaw, unspecified side, initial encounter: Secondary | ICD-10-CM | POA: Insufficient documentation

## 2013-07-30 DIAGNOSIS — IMO0002 Reserved for concepts with insufficient information to code with codable children: Secondary | ICD-10-CM | POA: Diagnosis not present

## 2013-07-30 HISTORY — DX: Malignant neoplasm of uterus, part unspecified: C55

## 2013-07-30 HISTORY — DX: Chronic pain syndrome: G89.4

## 2013-07-30 HISTORY — DX: Dislocation of jaw, unspecified side, initial encounter: S03.00XA

## 2013-07-30 HISTORY — DX: Pain in thoracic spine: M54.6

## 2013-08-28 DIAGNOSIS — Z09 Encounter for follow-up examination after completed treatment for conditions other than malignant neoplasm: Secondary | ICD-10-CM | POA: Diagnosis not present

## 2013-08-28 DIAGNOSIS — D473 Essential (hemorrhagic) thrombocythemia: Secondary | ICD-10-CM | POA: Diagnosis not present

## 2013-08-29 DIAGNOSIS — M19079 Primary osteoarthritis, unspecified ankle and foot: Secondary | ICD-10-CM | POA: Diagnosis not present

## 2013-08-29 DIAGNOSIS — I10 Essential (primary) hypertension: Secondary | ICD-10-CM | POA: Diagnosis not present

## 2013-08-29 DIAGNOSIS — R609 Edema, unspecified: Secondary | ICD-10-CM | POA: Diagnosis not present

## 2013-08-29 DIAGNOSIS — Z1331 Encounter for screening for depression: Secondary | ICD-10-CM | POA: Diagnosis not present

## 2013-08-29 DIAGNOSIS — E039 Hypothyroidism, unspecified: Secondary | ICD-10-CM | POA: Diagnosis not present

## 2013-09-06 DIAGNOSIS — M6240 Contracture of muscle, unspecified site: Secondary | ICD-10-CM | POA: Diagnosis not present

## 2013-09-06 DIAGNOSIS — M7750 Other enthesopathy of unspecified foot: Secondary | ICD-10-CM | POA: Diagnosis not present

## 2013-09-12 DIAGNOSIS — M546 Pain in thoracic spine: Secondary | ICD-10-CM | POA: Diagnosis not present

## 2013-09-12 DIAGNOSIS — IMO0001 Reserved for inherently not codable concepts without codable children: Secondary | ICD-10-CM | POA: Diagnosis not present

## 2013-09-12 DIAGNOSIS — D473 Essential (hemorrhagic) thrombocythemia: Secondary | ICD-10-CM

## 2013-09-12 DIAGNOSIS — M961 Postlaminectomy syndrome, not elsewhere classified: Secondary | ICD-10-CM | POA: Diagnosis not present

## 2013-09-12 HISTORY — DX: Essential (hemorrhagic) thrombocythemia: D47.3

## 2013-09-27 DIAGNOSIS — M7750 Other enthesopathy of unspecified foot: Secondary | ICD-10-CM | POA: Diagnosis not present

## 2013-09-27 DIAGNOSIS — M6240 Contracture of muscle, unspecified site: Secondary | ICD-10-CM | POA: Diagnosis not present

## 2013-10-11 DIAGNOSIS — M533 Sacrococcygeal disorders, not elsewhere classified: Secondary | ICD-10-CM | POA: Diagnosis not present

## 2013-10-11 DIAGNOSIS — M47817 Spondylosis without myelopathy or radiculopathy, lumbosacral region: Secondary | ICD-10-CM | POA: Diagnosis not present

## 2013-10-11 DIAGNOSIS — M5137 Other intervertebral disc degeneration, lumbosacral region: Secondary | ICD-10-CM | POA: Diagnosis not present

## 2013-10-11 DIAGNOSIS — IMO0001 Reserved for inherently not codable concepts without codable children: Secondary | ICD-10-CM | POA: Diagnosis not present

## 2013-10-23 DIAGNOSIS — M7750 Other enthesopathy of unspecified foot: Secondary | ICD-10-CM | POA: Diagnosis not present

## 2013-10-23 DIAGNOSIS — M6240 Contracture of muscle, unspecified site: Secondary | ICD-10-CM | POA: Diagnosis not present

## 2013-11-01 DIAGNOSIS — Z0000 Encounter for general adult medical examination without abnormal findings: Secondary | ICD-10-CM | POA: Diagnosis not present

## 2013-11-01 DIAGNOSIS — IMO0002 Reserved for concepts with insufficient information to code with codable children: Secondary | ICD-10-CM | POA: Diagnosis not present

## 2013-11-01 DIAGNOSIS — E039 Hypothyroidism, unspecified: Secondary | ICD-10-CM | POA: Diagnosis not present

## 2013-11-01 DIAGNOSIS — J309 Allergic rhinitis, unspecified: Secondary | ICD-10-CM | POA: Diagnosis not present

## 2013-11-01 DIAGNOSIS — Z1212 Encounter for screening for malignant neoplasm of rectum: Secondary | ICD-10-CM | POA: Diagnosis not present

## 2013-11-01 DIAGNOSIS — M1710 Unilateral primary osteoarthritis, unspecified knee: Secondary | ICD-10-CM | POA: Diagnosis not present

## 2013-11-05 DIAGNOSIS — M961 Postlaminectomy syndrome, not elsewhere classified: Secondary | ICD-10-CM | POA: Diagnosis not present

## 2013-11-21 DIAGNOSIS — M546 Pain in thoracic spine: Secondary | ICD-10-CM | POA: Diagnosis not present

## 2013-11-22 DIAGNOSIS — Z1231 Encounter for screening mammogram for malignant neoplasm of breast: Secondary | ICD-10-CM | POA: Diagnosis not present

## 2013-12-04 DIAGNOSIS — B370 Candidal stomatitis: Secondary | ICD-10-CM | POA: Diagnosis not present

## 2013-12-04 DIAGNOSIS — J029 Acute pharyngitis, unspecified: Secondary | ICD-10-CM | POA: Diagnosis not present

## 2013-12-17 DIAGNOSIS — K219 Gastro-esophageal reflux disease without esophagitis: Secondary | ICD-10-CM | POA: Diagnosis not present

## 2013-12-17 DIAGNOSIS — K5900 Constipation, unspecified: Secondary | ICD-10-CM | POA: Diagnosis not present

## 2013-12-17 DIAGNOSIS — R1310 Dysphagia, unspecified: Secondary | ICD-10-CM | POA: Diagnosis not present

## 2013-12-17 DIAGNOSIS — K222 Esophageal obstruction: Secondary | ICD-10-CM | POA: Diagnosis not present

## 2013-12-20 DIAGNOSIS — IMO0001 Reserved for inherently not codable concepts without codable children: Secondary | ICD-10-CM | POA: Diagnosis not present

## 2013-12-20 DIAGNOSIS — M5137 Other intervertebral disc degeneration, lumbosacral region: Secondary | ICD-10-CM | POA: Diagnosis not present

## 2013-12-20 DIAGNOSIS — M961 Postlaminectomy syndrome, not elsewhere classified: Secondary | ICD-10-CM | POA: Diagnosis not present

## 2014-01-23 DIAGNOSIS — R110 Nausea: Secondary | ICD-10-CM | POA: Diagnosis not present

## 2014-01-23 DIAGNOSIS — Z6833 Body mass index (BMI) 33.0-33.9, adult: Secondary | ICD-10-CM | POA: Diagnosis not present

## 2014-01-23 DIAGNOSIS — R509 Fever, unspecified: Secondary | ICD-10-CM | POA: Diagnosis not present

## 2014-01-23 DIAGNOSIS — B349 Viral infection, unspecified: Secondary | ICD-10-CM | POA: Diagnosis not present

## 2014-02-01 DIAGNOSIS — M47814 Spondylosis without myelopathy or radiculopathy, thoracic region: Secondary | ICD-10-CM | POA: Diagnosis not present

## 2014-02-01 DIAGNOSIS — M129 Arthropathy, unspecified: Secondary | ICD-10-CM | POA: Diagnosis not present

## 2014-02-01 DIAGNOSIS — M533 Sacrococcygeal disorders, not elsewhere classified: Secondary | ICD-10-CM | POA: Diagnosis not present

## 2014-02-01 DIAGNOSIS — M797 Fibromyalgia: Secondary | ICD-10-CM | POA: Diagnosis not present

## 2014-02-04 DIAGNOSIS — Z6833 Body mass index (BMI) 33.0-33.9, adult: Secondary | ICD-10-CM | POA: Diagnosis not present

## 2014-02-04 DIAGNOSIS — J019 Acute sinusitis, unspecified: Secondary | ICD-10-CM | POA: Diagnosis not present

## 2014-02-04 DIAGNOSIS — J029 Acute pharyngitis, unspecified: Secondary | ICD-10-CM | POA: Diagnosis not present

## 2014-02-04 DIAGNOSIS — F419 Anxiety disorder, unspecified: Secondary | ICD-10-CM | POA: Diagnosis not present

## 2014-02-04 DIAGNOSIS — Z1389 Encounter for screening for other disorder: Secondary | ICD-10-CM | POA: Diagnosis not present

## 2014-02-04 DIAGNOSIS — Z9181 History of falling: Secondary | ICD-10-CM | POA: Diagnosis not present

## 2014-02-14 DIAGNOSIS — Z6833 Body mass index (BMI) 33.0-33.9, adult: Secondary | ICD-10-CM | POA: Diagnosis not present

## 2014-02-14 DIAGNOSIS — M1990 Unspecified osteoarthritis, unspecified site: Secondary | ICD-10-CM | POA: Diagnosis not present

## 2014-04-02 DIAGNOSIS — E039 Hypothyroidism, unspecified: Secondary | ICD-10-CM | POA: Diagnosis not present

## 2014-04-02 DIAGNOSIS — M179 Osteoarthritis of knee, unspecified: Secondary | ICD-10-CM | POA: Diagnosis not present

## 2014-04-04 DIAGNOSIS — M47817 Spondylosis without myelopathy or radiculopathy, lumbosacral region: Secondary | ICD-10-CM | POA: Diagnosis not present

## 2014-04-04 DIAGNOSIS — M797 Fibromyalgia: Secondary | ICD-10-CM | POA: Diagnosis not present

## 2014-04-04 DIAGNOSIS — M47814 Spondylosis without myelopathy or radiculopathy, thoracic region: Secondary | ICD-10-CM | POA: Diagnosis not present

## 2014-04-04 DIAGNOSIS — M5137 Other intervertebral disc degeneration, lumbosacral region: Secondary | ICD-10-CM | POA: Diagnosis not present

## 2014-04-17 DIAGNOSIS — E039 Hypothyroidism, unspecified: Secondary | ICD-10-CM | POA: Diagnosis not present

## 2014-04-17 DIAGNOSIS — I10 Essential (primary) hypertension: Secondary | ICD-10-CM | POA: Diagnosis not present

## 2014-04-17 DIAGNOSIS — R112 Nausea with vomiting, unspecified: Secondary | ICD-10-CM | POA: Diagnosis not present

## 2014-04-17 DIAGNOSIS — Z7982 Long term (current) use of aspirin: Secondary | ICD-10-CM | POA: Diagnosis not present

## 2014-04-17 DIAGNOSIS — G43909 Migraine, unspecified, not intractable, without status migrainosus: Secondary | ICD-10-CM | POA: Diagnosis not present

## 2014-04-17 DIAGNOSIS — E780 Pure hypercholesterolemia: Secondary | ICD-10-CM | POA: Diagnosis not present

## 2014-06-03 DIAGNOSIS — M533 Sacrococcygeal disorders, not elsewhere classified: Secondary | ICD-10-CM | POA: Diagnosis not present

## 2014-06-03 DIAGNOSIS — M5137 Other intervertebral disc degeneration, lumbosacral region: Secondary | ICD-10-CM | POA: Diagnosis not present

## 2014-06-03 DIAGNOSIS — M797 Fibromyalgia: Secondary | ICD-10-CM | POA: Diagnosis not present

## 2014-06-03 DIAGNOSIS — M4724 Other spondylosis with radiculopathy, thoracic region: Secondary | ICD-10-CM | POA: Diagnosis not present

## 2014-06-04 DIAGNOSIS — E039 Hypothyroidism, unspecified: Secondary | ICD-10-CM | POA: Diagnosis not present

## 2014-06-04 DIAGNOSIS — K219 Gastro-esophageal reflux disease without esophagitis: Secondary | ICD-10-CM | POA: Diagnosis not present

## 2014-06-04 DIAGNOSIS — E782 Mixed hyperlipidemia: Secondary | ICD-10-CM | POA: Diagnosis not present

## 2014-06-04 DIAGNOSIS — J309 Allergic rhinitis, unspecified: Secondary | ICD-10-CM | POA: Diagnosis not present

## 2014-06-04 DIAGNOSIS — Z79899 Other long term (current) drug therapy: Secondary | ICD-10-CM | POA: Diagnosis not present

## 2014-06-04 DIAGNOSIS — E559 Vitamin D deficiency, unspecified: Secondary | ICD-10-CM | POA: Diagnosis not present

## 2014-06-04 DIAGNOSIS — Z6831 Body mass index (BMI) 31.0-31.9, adult: Secondary | ICD-10-CM | POA: Diagnosis not present

## 2014-06-04 DIAGNOSIS — M170 Bilateral primary osteoarthritis of knee: Secondary | ICD-10-CM | POA: Diagnosis not present

## 2014-06-27 DIAGNOSIS — E039 Hypothyroidism, unspecified: Secondary | ICD-10-CM | POA: Diagnosis not present

## 2014-06-27 DIAGNOSIS — M1990 Unspecified osteoarthritis, unspecified site: Secondary | ICD-10-CM | POA: Diagnosis not present

## 2014-06-27 DIAGNOSIS — F329 Major depressive disorder, single episode, unspecified: Secondary | ICD-10-CM | POA: Diagnosis not present

## 2014-06-27 DIAGNOSIS — Z6831 Body mass index (BMI) 31.0-31.9, adult: Secondary | ICD-10-CM | POA: Diagnosis not present

## 2014-07-04 DIAGNOSIS — M4724 Other spondylosis with radiculopathy, thoracic region: Secondary | ICD-10-CM | POA: Diagnosis not present

## 2014-09-02 DIAGNOSIS — D473 Essential (hemorrhagic) thrombocythemia: Secondary | ICD-10-CM | POA: Diagnosis not present

## 2014-09-05 DIAGNOSIS — M5137 Other intervertebral disc degeneration, lumbosacral region: Secondary | ICD-10-CM | POA: Diagnosis not present

## 2014-09-05 DIAGNOSIS — M961 Postlaminectomy syndrome, not elsewhere classified: Secondary | ICD-10-CM | POA: Diagnosis not present

## 2014-09-05 DIAGNOSIS — Z5181 Encounter for therapeutic drug level monitoring: Secondary | ICD-10-CM | POA: Diagnosis not present

## 2014-09-05 DIAGNOSIS — M4724 Other spondylosis with radiculopathy, thoracic region: Secondary | ICD-10-CM | POA: Diagnosis not present

## 2014-09-05 DIAGNOSIS — M533 Sacrococcygeal disorders, not elsewhere classified: Secondary | ICD-10-CM | POA: Diagnosis not present

## 2014-09-13 DIAGNOSIS — M25569 Pain in unspecified knee: Secondary | ICD-10-CM | POA: Diagnosis not present

## 2014-09-13 DIAGNOSIS — J029 Acute pharyngitis, unspecified: Secondary | ICD-10-CM | POA: Diagnosis not present

## 2014-09-13 DIAGNOSIS — Z6832 Body mass index (BMI) 32.0-32.9, adult: Secondary | ICD-10-CM | POA: Diagnosis not present

## 2014-09-17 DIAGNOSIS — M7989 Other specified soft tissue disorders: Secondary | ICD-10-CM | POA: Diagnosis not present

## 2014-09-17 DIAGNOSIS — S82142A Displaced bicondylar fracture of left tibia, initial encounter for closed fracture: Secondary | ICD-10-CM | POA: Diagnosis not present

## 2014-09-17 DIAGNOSIS — M79605 Pain in left leg: Secondary | ICD-10-CM | POA: Diagnosis not present

## 2014-09-17 DIAGNOSIS — Z9181 History of falling: Secondary | ICD-10-CM | POA: Diagnosis not present

## 2014-09-17 DIAGNOSIS — K120 Recurrent oral aphthae: Secondary | ICD-10-CM | POA: Diagnosis not present

## 2014-09-17 DIAGNOSIS — Z86718 Personal history of other venous thrombosis and embolism: Secondary | ICD-10-CM | POA: Diagnosis not present

## 2014-09-17 DIAGNOSIS — K121 Other forms of stomatitis: Secondary | ICD-10-CM | POA: Diagnosis not present

## 2014-10-03 DIAGNOSIS — Z471 Aftercare following joint replacement surgery: Secondary | ICD-10-CM | POA: Diagnosis not present

## 2014-10-03 DIAGNOSIS — Z96652 Presence of left artificial knee joint: Secondary | ICD-10-CM | POA: Diagnosis not present

## 2014-10-03 DIAGNOSIS — M1711 Unilateral primary osteoarthritis, right knee: Secondary | ICD-10-CM | POA: Diagnosis not present

## 2014-10-03 DIAGNOSIS — S82202A Unspecified fracture of shaft of left tibia, initial encounter for closed fracture: Secondary | ICD-10-CM | POA: Diagnosis not present

## 2014-10-06 DIAGNOSIS — Z96659 Presence of unspecified artificial knee joint: Secondary | ICD-10-CM | POA: Insufficient documentation

## 2014-10-06 DIAGNOSIS — S82202A Unspecified fracture of shaft of left tibia, initial encounter for closed fracture: Secondary | ICD-10-CM | POA: Insufficient documentation

## 2014-10-06 DIAGNOSIS — M1711 Unilateral primary osteoarthritis, right knee: Secondary | ICD-10-CM

## 2014-10-06 HISTORY — DX: Unilateral primary osteoarthritis, right knee: M17.11

## 2014-11-05 DIAGNOSIS — M961 Postlaminectomy syndrome, not elsewhere classified: Secondary | ICD-10-CM | POA: Diagnosis not present

## 2014-11-05 DIAGNOSIS — M5137 Other intervertebral disc degeneration, lumbosacral region: Secondary | ICD-10-CM | POA: Diagnosis not present

## 2014-11-05 DIAGNOSIS — M546 Pain in thoracic spine: Secondary | ICD-10-CM | POA: Diagnosis not present

## 2014-11-05 DIAGNOSIS — M797 Fibromyalgia: Secondary | ICD-10-CM | POA: Diagnosis not present

## 2014-11-12 DIAGNOSIS — E039 Hypothyroidism, unspecified: Secondary | ICD-10-CM | POA: Diagnosis not present

## 2014-11-12 DIAGNOSIS — Z9181 History of falling: Secondary | ICD-10-CM | POA: Diagnosis not present

## 2014-11-12 DIAGNOSIS — I10 Essential (primary) hypertension: Secondary | ICD-10-CM | POA: Diagnosis not present

## 2014-11-12 DIAGNOSIS — M179 Osteoarthritis of knee, unspecified: Secondary | ICD-10-CM | POA: Diagnosis not present

## 2014-11-12 DIAGNOSIS — Z79899 Other long term (current) drug therapy: Secondary | ICD-10-CM | POA: Diagnosis not present

## 2014-11-12 DIAGNOSIS — E559 Vitamin D deficiency, unspecified: Secondary | ICD-10-CM | POA: Diagnosis not present

## 2014-11-12 DIAGNOSIS — Z6832 Body mass index (BMI) 32.0-32.9, adult: Secondary | ICD-10-CM | POA: Diagnosis not present

## 2014-12-26 DIAGNOSIS — Z6832 Body mass index (BMI) 32.0-32.9, adult: Secondary | ICD-10-CM | POA: Diagnosis not present

## 2014-12-26 DIAGNOSIS — M179 Osteoarthritis of knee, unspecified: Secondary | ICD-10-CM | POA: Diagnosis not present

## 2014-12-26 DIAGNOSIS — R202 Paresthesia of skin: Secondary | ICD-10-CM | POA: Diagnosis not present

## 2014-12-26 DIAGNOSIS — E871 Hypo-osmolality and hyponatremia: Secondary | ICD-10-CM | POA: Diagnosis not present

## 2015-01-02 DIAGNOSIS — M4727 Other spondylosis with radiculopathy, lumbosacral region: Secondary | ICD-10-CM | POA: Diagnosis not present

## 2015-01-02 DIAGNOSIS — M5137 Other intervertebral disc degeneration, lumbosacral region: Secondary | ICD-10-CM | POA: Diagnosis not present

## 2015-01-02 DIAGNOSIS — M1990 Unspecified osteoarthritis, unspecified site: Secondary | ICD-10-CM | POA: Diagnosis not present

## 2015-01-02 DIAGNOSIS — M797 Fibromyalgia: Secondary | ICD-10-CM | POA: Diagnosis not present

## 2015-01-30 DIAGNOSIS — R921 Mammographic calcification found on diagnostic imaging of breast: Secondary | ICD-10-CM | POA: Diagnosis not present

## 2015-01-30 DIAGNOSIS — Z1231 Encounter for screening mammogram for malignant neoplasm of breast: Secondary | ICD-10-CM | POA: Diagnosis not present

## 2015-02-06 DIAGNOSIS — R921 Mammographic calcification found on diagnostic imaging of breast: Secondary | ICD-10-CM | POA: Diagnosis not present

## 2015-02-06 DIAGNOSIS — R928 Other abnormal and inconclusive findings on diagnostic imaging of breast: Secondary | ICD-10-CM | POA: Diagnosis not present

## 2015-03-06 DIAGNOSIS — M1711 Unilateral primary osteoarthritis, right knee: Secondary | ICD-10-CM | POA: Diagnosis not present

## 2015-03-06 DIAGNOSIS — M961 Postlaminectomy syndrome, not elsewhere classified: Secondary | ICD-10-CM | POA: Diagnosis not present

## 2015-03-06 DIAGNOSIS — M546 Pain in thoracic spine: Secondary | ICD-10-CM | POA: Diagnosis not present

## 2015-03-06 DIAGNOSIS — M797 Fibromyalgia: Secondary | ICD-10-CM | POA: Diagnosis not present

## 2015-03-06 DIAGNOSIS — M5137 Other intervertebral disc degeneration, lumbosacral region: Secondary | ICD-10-CM | POA: Diagnosis not present

## 2015-04-10 DIAGNOSIS — E559 Vitamin D deficiency, unspecified: Secondary | ICD-10-CM | POA: Diagnosis not present

## 2015-04-10 DIAGNOSIS — J029 Acute pharyngitis, unspecified: Secondary | ICD-10-CM | POA: Diagnosis not present

## 2015-04-10 DIAGNOSIS — Z79899 Other long term (current) drug therapy: Secondary | ICD-10-CM | POA: Diagnosis not present

## 2015-04-10 DIAGNOSIS — I10 Essential (primary) hypertension: Secondary | ICD-10-CM | POA: Diagnosis not present

## 2015-04-10 DIAGNOSIS — R600 Localized edema: Secondary | ICD-10-CM | POA: Diagnosis not present

## 2015-04-10 DIAGNOSIS — B999 Unspecified infectious disease: Secondary | ICD-10-CM | POA: Diagnosis not present

## 2015-04-10 DIAGNOSIS — E039 Hypothyroidism, unspecified: Secondary | ICD-10-CM | POA: Diagnosis not present

## 2015-04-10 DIAGNOSIS — Z6835 Body mass index (BMI) 35.0-35.9, adult: Secondary | ICD-10-CM | POA: Diagnosis not present

## 2015-04-17 DIAGNOSIS — Z6832 Body mass index (BMI) 32.0-32.9, adult: Secondary | ICD-10-CM | POA: Diagnosis not present

## 2015-04-17 DIAGNOSIS — E782 Mixed hyperlipidemia: Secondary | ICD-10-CM | POA: Diagnosis not present

## 2015-04-17 DIAGNOSIS — Z23 Encounter for immunization: Secondary | ICD-10-CM | POA: Diagnosis not present

## 2015-04-17 DIAGNOSIS — E559 Vitamin D deficiency, unspecified: Secondary | ICD-10-CM | POA: Diagnosis not present

## 2015-04-17 DIAGNOSIS — M2550 Pain in unspecified joint: Secondary | ICD-10-CM | POA: Diagnosis not present

## 2015-04-17 DIAGNOSIS — E039 Hypothyroidism, unspecified: Secondary | ICD-10-CM | POA: Diagnosis not present

## 2015-04-17 DIAGNOSIS — R21 Rash and other nonspecific skin eruption: Secondary | ICD-10-CM | POA: Diagnosis not present

## 2015-04-17 DIAGNOSIS — G250 Essential tremor: Secondary | ICD-10-CM | POA: Diagnosis not present

## 2015-05-06 DIAGNOSIS — M546 Pain in thoracic spine: Secondary | ICD-10-CM | POA: Diagnosis not present

## 2015-05-06 DIAGNOSIS — M961 Postlaminectomy syndrome, not elsewhere classified: Secondary | ICD-10-CM | POA: Diagnosis not present

## 2015-05-06 DIAGNOSIS — M5137 Other intervertebral disc degeneration, lumbosacral region: Secondary | ICD-10-CM | POA: Diagnosis not present

## 2015-05-06 DIAGNOSIS — M797 Fibromyalgia: Secondary | ICD-10-CM | POA: Diagnosis not present

## 2015-06-11 DIAGNOSIS — R1310 Dysphagia, unspecified: Secondary | ICD-10-CM | POA: Diagnosis not present

## 2015-06-11 DIAGNOSIS — K219 Gastro-esophageal reflux disease without esophagitis: Secondary | ICD-10-CM | POA: Diagnosis not present

## 2015-06-11 DIAGNOSIS — K5900 Constipation, unspecified: Secondary | ICD-10-CM | POA: Diagnosis not present

## 2015-06-19 DIAGNOSIS — E039 Hypothyroidism, unspecified: Secondary | ICD-10-CM | POA: Diagnosis not present

## 2015-06-19 DIAGNOSIS — Z9049 Acquired absence of other specified parts of digestive tract: Secondary | ICD-10-CM | POA: Diagnosis not present

## 2015-06-19 DIAGNOSIS — K219 Gastro-esophageal reflux disease without esophagitis: Secondary | ICD-10-CM | POA: Diagnosis not present

## 2015-06-19 DIAGNOSIS — Z79899 Other long term (current) drug therapy: Secondary | ICD-10-CM | POA: Diagnosis not present

## 2015-06-19 DIAGNOSIS — Z86010 Personal history of colonic polyps: Secondary | ICD-10-CM | POA: Diagnosis not present

## 2015-06-19 DIAGNOSIS — K209 Esophagitis, unspecified: Secondary | ICD-10-CM | POA: Diagnosis not present

## 2015-06-19 DIAGNOSIS — Z8542 Personal history of malignant neoplasm of other parts of uterus: Secondary | ICD-10-CM | POA: Diagnosis not present

## 2015-06-19 DIAGNOSIS — I10 Essential (primary) hypertension: Secondary | ICD-10-CM | POA: Diagnosis not present

## 2015-06-19 DIAGNOSIS — R1310 Dysphagia, unspecified: Secondary | ICD-10-CM | POA: Diagnosis not present

## 2015-07-03 DIAGNOSIS — M797 Fibromyalgia: Secondary | ICD-10-CM | POA: Diagnosis not present

## 2015-07-03 DIAGNOSIS — M961 Postlaminectomy syndrome, not elsewhere classified: Secondary | ICD-10-CM | POA: Diagnosis not present

## 2015-07-03 DIAGNOSIS — M5137 Other intervertebral disc degeneration, lumbosacral region: Secondary | ICD-10-CM | POA: Diagnosis not present

## 2015-07-03 DIAGNOSIS — M546 Pain in thoracic spine: Secondary | ICD-10-CM | POA: Diagnosis not present

## 2015-09-04 DIAGNOSIS — M1711 Unilateral primary osteoarthritis, right knee: Secondary | ICD-10-CM | POA: Diagnosis not present

## 2015-09-04 DIAGNOSIS — Z5181 Encounter for therapeutic drug level monitoring: Secondary | ICD-10-CM | POA: Diagnosis not present

## 2015-09-04 DIAGNOSIS — M961 Postlaminectomy syndrome, not elsewhere classified: Secondary | ICD-10-CM | POA: Diagnosis not present

## 2015-09-04 DIAGNOSIS — M5137 Other intervertebral disc degeneration, lumbosacral region: Secondary | ICD-10-CM | POA: Diagnosis not present

## 2015-09-04 DIAGNOSIS — M4727 Other spondylosis with radiculopathy, lumbosacral region: Secondary | ICD-10-CM | POA: Diagnosis not present

## 2015-09-08 DIAGNOSIS — M1711 Unilateral primary osteoarthritis, right knee: Secondary | ICD-10-CM | POA: Diagnosis not present

## 2015-09-08 DIAGNOSIS — D473 Essential (hemorrhagic) thrombocythemia: Secondary | ICD-10-CM | POA: Diagnosis not present

## 2015-09-08 DIAGNOSIS — E782 Mixed hyperlipidemia: Secondary | ICD-10-CM | POA: Diagnosis not present

## 2015-09-08 DIAGNOSIS — I10 Essential (primary) hypertension: Secondary | ICD-10-CM | POA: Diagnosis not present

## 2015-09-08 DIAGNOSIS — E039 Hypothyroidism, unspecified: Secondary | ICD-10-CM | POA: Diagnosis not present

## 2015-09-08 DIAGNOSIS — Z6834 Body mass index (BMI) 34.0-34.9, adult: Secondary | ICD-10-CM | POA: Diagnosis not present

## 2015-09-08 DIAGNOSIS — Z79899 Other long term (current) drug therapy: Secondary | ICD-10-CM | POA: Diagnosis not present

## 2015-10-02 DIAGNOSIS — I10 Essential (primary) hypertension: Secondary | ICD-10-CM | POA: Diagnosis not present

## 2015-10-02 DIAGNOSIS — E669 Obesity, unspecified: Secondary | ICD-10-CM | POA: Diagnosis not present

## 2015-10-02 DIAGNOSIS — R5381 Other malaise: Secondary | ICD-10-CM | POA: Diagnosis not present

## 2015-10-02 DIAGNOSIS — R635 Abnormal weight gain: Secondary | ICD-10-CM | POA: Diagnosis not present

## 2015-10-02 DIAGNOSIS — Z6834 Body mass index (BMI) 34.0-34.9, adult: Secondary | ICD-10-CM | POA: Diagnosis not present

## 2015-10-02 DIAGNOSIS — E782 Mixed hyperlipidemia: Secondary | ICD-10-CM | POA: Diagnosis not present

## 2015-10-02 DIAGNOSIS — R0602 Shortness of breath: Secondary | ICD-10-CM | POA: Diagnosis not present

## 2015-10-30 DIAGNOSIS — M5137 Other intervertebral disc degeneration, lumbosacral region: Secondary | ICD-10-CM | POA: Diagnosis not present

## 2015-10-30 DIAGNOSIS — M961 Postlaminectomy syndrome, not elsewhere classified: Secondary | ICD-10-CM | POA: Diagnosis not present

## 2015-10-30 DIAGNOSIS — M4727 Other spondylosis with radiculopathy, lumbosacral region: Secondary | ICD-10-CM | POA: Diagnosis not present

## 2015-10-30 DIAGNOSIS — M797 Fibromyalgia: Secondary | ICD-10-CM | POA: Diagnosis not present

## 2015-11-03 DIAGNOSIS — I10 Essential (primary) hypertension: Secondary | ICD-10-CM | POA: Diagnosis not present

## 2015-11-03 DIAGNOSIS — R635 Abnormal weight gain: Secondary | ICD-10-CM | POA: Diagnosis not present

## 2015-11-03 DIAGNOSIS — Z6833 Body mass index (BMI) 33.0-33.9, adult: Secondary | ICD-10-CM | POA: Diagnosis not present

## 2015-12-04 DIAGNOSIS — R635 Abnormal weight gain: Secondary | ICD-10-CM | POA: Diagnosis not present

## 2015-12-04 DIAGNOSIS — Z6831 Body mass index (BMI) 31.0-31.9, adult: Secondary | ICD-10-CM | POA: Diagnosis not present

## 2015-12-10 DIAGNOSIS — Z6831 Body mass index (BMI) 31.0-31.9, adult: Secondary | ICD-10-CM | POA: Diagnosis not present

## 2015-12-10 DIAGNOSIS — E669 Obesity, unspecified: Secondary | ICD-10-CM | POA: Diagnosis not present

## 2015-12-10 DIAGNOSIS — W19XXXA Unspecified fall, initial encounter: Secondary | ICD-10-CM | POA: Diagnosis not present

## 2016-01-09 DIAGNOSIS — M961 Postlaminectomy syndrome, not elsewhere classified: Secondary | ICD-10-CM | POA: Diagnosis not present

## 2016-01-09 DIAGNOSIS — M4727 Other spondylosis with radiculopathy, lumbosacral region: Secondary | ICD-10-CM | POA: Diagnosis not present

## 2016-01-09 DIAGNOSIS — M546 Pain in thoracic spine: Secondary | ICD-10-CM | POA: Diagnosis not present

## 2016-01-09 DIAGNOSIS — M797 Fibromyalgia: Secondary | ICD-10-CM | POA: Diagnosis not present

## 2016-01-09 DIAGNOSIS — M5137 Other intervertebral disc degeneration, lumbosacral region: Secondary | ICD-10-CM | POA: Diagnosis not present

## 2016-02-16 DIAGNOSIS — Z0000 Encounter for general adult medical examination without abnormal findings: Secondary | ICD-10-CM | POA: Diagnosis not present

## 2016-02-16 DIAGNOSIS — E559 Vitamin D deficiency, unspecified: Secondary | ICD-10-CM | POA: Diagnosis not present

## 2016-02-16 DIAGNOSIS — Z79899 Other long term (current) drug therapy: Secondary | ICD-10-CM | POA: Diagnosis not present

## 2016-02-16 DIAGNOSIS — E039 Hypothyroidism, unspecified: Secondary | ICD-10-CM | POA: Diagnosis not present

## 2016-02-17 DIAGNOSIS — Z23 Encounter for immunization: Secondary | ICD-10-CM | POA: Diagnosis not present

## 2016-02-17 DIAGNOSIS — Z6831 Body mass index (BMI) 31.0-31.9, adult: Secondary | ICD-10-CM | POA: Diagnosis not present

## 2016-02-17 DIAGNOSIS — R635 Abnormal weight gain: Secondary | ICD-10-CM | POA: Diagnosis not present

## 2016-02-18 DIAGNOSIS — K219 Gastro-esophageal reflux disease without esophagitis: Secondary | ICD-10-CM | POA: Diagnosis not present

## 2016-02-18 DIAGNOSIS — R600 Localized edema: Secondary | ICD-10-CM | POA: Diagnosis not present

## 2016-02-18 DIAGNOSIS — E559 Vitamin D deficiency, unspecified: Secondary | ICD-10-CM | POA: Diagnosis not present

## 2016-02-18 DIAGNOSIS — Z6831 Body mass index (BMI) 31.0-31.9, adult: Secondary | ICD-10-CM | POA: Diagnosis not present

## 2016-02-18 DIAGNOSIS — J309 Allergic rhinitis, unspecified: Secondary | ICD-10-CM | POA: Diagnosis not present

## 2016-02-18 DIAGNOSIS — F418 Other specified anxiety disorders: Secondary | ICD-10-CM | POA: Diagnosis not present

## 2016-02-18 DIAGNOSIS — Z79899 Other long term (current) drug therapy: Secondary | ICD-10-CM | POA: Diagnosis not present

## 2016-02-18 DIAGNOSIS — H8109 Meniere's disease, unspecified ear: Secondary | ICD-10-CM | POA: Diagnosis not present

## 2016-02-18 DIAGNOSIS — Z0000 Encounter for general adult medical examination without abnormal findings: Secondary | ICD-10-CM | POA: Diagnosis not present

## 2016-02-18 DIAGNOSIS — I10 Essential (primary) hypertension: Secondary | ICD-10-CM | POA: Diagnosis not present

## 2016-02-18 DIAGNOSIS — Z1231 Encounter for screening mammogram for malignant neoplasm of breast: Secondary | ICD-10-CM | POA: Diagnosis not present

## 2016-02-26 DIAGNOSIS — Z1231 Encounter for screening mammogram for malignant neoplasm of breast: Secondary | ICD-10-CM | POA: Diagnosis not present

## 2016-03-02 DIAGNOSIS — H6692 Otitis media, unspecified, left ear: Secondary | ICD-10-CM | POA: Diagnosis not present

## 2016-03-02 DIAGNOSIS — I10 Essential (primary) hypertension: Secondary | ICD-10-CM | POA: Diagnosis not present

## 2016-03-02 DIAGNOSIS — J019 Acute sinusitis, unspecified: Secondary | ICD-10-CM | POA: Diagnosis not present

## 2016-03-02 DIAGNOSIS — M1711 Unilateral primary osteoarthritis, right knee: Secondary | ICD-10-CM | POA: Diagnosis not present

## 2016-03-02 DIAGNOSIS — Z6831 Body mass index (BMI) 31.0-31.9, adult: Secondary | ICD-10-CM | POA: Diagnosis not present

## 2016-03-08 DIAGNOSIS — Z6830 Body mass index (BMI) 30.0-30.9, adult: Secondary | ICD-10-CM | POA: Diagnosis not present

## 2016-03-08 DIAGNOSIS — M8589 Other specified disorders of bone density and structure, multiple sites: Secondary | ICD-10-CM | POA: Diagnosis not present

## 2016-03-08 DIAGNOSIS — E669 Obesity, unspecified: Secondary | ICD-10-CM | POA: Diagnosis not present

## 2016-03-08 DIAGNOSIS — M7062 Trochanteric bursitis, left hip: Secondary | ICD-10-CM | POA: Diagnosis not present

## 2016-03-08 DIAGNOSIS — E871 Hypo-osmolality and hyponatremia: Secondary | ICD-10-CM | POA: Diagnosis not present

## 2016-03-09 DIAGNOSIS — M1711 Unilateral primary osteoarthritis, right knee: Secondary | ICD-10-CM | POA: Diagnosis not present

## 2016-03-09 DIAGNOSIS — M4727 Other spondylosis with radiculopathy, lumbosacral region: Secondary | ICD-10-CM | POA: Diagnosis not present

## 2016-03-09 DIAGNOSIS — M961 Postlaminectomy syndrome, not elsewhere classified: Secondary | ICD-10-CM | POA: Diagnosis not present

## 2016-03-09 DIAGNOSIS — M5137 Other intervertebral disc degeneration, lumbosacral region: Secondary | ICD-10-CM | POA: Diagnosis not present

## 2016-03-09 DIAGNOSIS — M797 Fibromyalgia: Secondary | ICD-10-CM | POA: Diagnosis not present

## 2016-03-16 DIAGNOSIS — R928 Other abnormal and inconclusive findings on diagnostic imaging of breast: Secondary | ICD-10-CM | POA: Diagnosis not present

## 2016-03-16 DIAGNOSIS — N6322 Unspecified lump in the left breast, upper inner quadrant: Secondary | ICD-10-CM | POA: Diagnosis not present

## 2016-03-16 DIAGNOSIS — N6320 Unspecified lump in the left breast, unspecified quadrant: Secondary | ICD-10-CM | POA: Diagnosis not present

## 2016-03-17 DIAGNOSIS — Z6830 Body mass index (BMI) 30.0-30.9, adult: Secondary | ICD-10-CM | POA: Diagnosis not present

## 2016-03-17 DIAGNOSIS — R635 Abnormal weight gain: Secondary | ICD-10-CM | POA: Diagnosis not present

## 2016-03-29 DIAGNOSIS — Z6830 Body mass index (BMI) 30.0-30.9, adult: Secondary | ICD-10-CM | POA: Diagnosis not present

## 2016-03-29 DIAGNOSIS — K140 Glossitis: Secondary | ICD-10-CM | POA: Diagnosis not present

## 2016-03-29 DIAGNOSIS — D473 Essential (hemorrhagic) thrombocythemia: Secondary | ICD-10-CM | POA: Diagnosis not present

## 2016-03-29 DIAGNOSIS — K121 Other forms of stomatitis: Secondary | ICD-10-CM | POA: Diagnosis not present

## 2016-05-10 DIAGNOSIS — M533 Sacrococcygeal disorders, not elsewhere classified: Secondary | ICD-10-CM

## 2016-05-10 DIAGNOSIS — M5137 Other intervertebral disc degeneration, lumbosacral region: Secondary | ICD-10-CM | POA: Diagnosis not present

## 2016-05-10 DIAGNOSIS — M797 Fibromyalgia: Secondary | ICD-10-CM | POA: Diagnosis not present

## 2016-05-10 DIAGNOSIS — M47814 Spondylosis without myelopathy or radiculopathy, thoracic region: Secondary | ICD-10-CM | POA: Diagnosis not present

## 2016-05-10 DIAGNOSIS — M4727 Other spondylosis with radiculopathy, lumbosacral region: Secondary | ICD-10-CM | POA: Diagnosis not present

## 2016-05-10 DIAGNOSIS — M961 Postlaminectomy syndrome, not elsewhere classified: Secondary | ICD-10-CM | POA: Diagnosis not present

## 2016-05-10 HISTORY — DX: Sacrococcygeal disorders, not elsewhere classified: M53.3

## 2016-05-31 DIAGNOSIS — E669 Obesity, unspecified: Secondary | ICD-10-CM | POA: Diagnosis not present

## 2016-05-31 DIAGNOSIS — H6692 Otitis media, unspecified, left ear: Secondary | ICD-10-CM | POA: Diagnosis not present

## 2016-05-31 DIAGNOSIS — Z6833 Body mass index (BMI) 33.0-33.9, adult: Secondary | ICD-10-CM | POA: Diagnosis not present

## 2016-05-31 DIAGNOSIS — R6889 Other general symptoms and signs: Secondary | ICD-10-CM | POA: Diagnosis not present

## 2016-06-30 DIAGNOSIS — K5900 Constipation, unspecified: Secondary | ICD-10-CM | POA: Diagnosis not present

## 2016-06-30 DIAGNOSIS — R1310 Dysphagia, unspecified: Secondary | ICD-10-CM | POA: Diagnosis not present

## 2016-06-30 DIAGNOSIS — K219 Gastro-esophageal reflux disease without esophagitis: Secondary | ICD-10-CM | POA: Diagnosis not present

## 2016-07-01 DIAGNOSIS — R1310 Dysphagia, unspecified: Secondary | ICD-10-CM | POA: Diagnosis not present

## 2016-07-01 DIAGNOSIS — M170 Bilateral primary osteoarthritis of knee: Secondary | ICD-10-CM | POA: Diagnosis not present

## 2016-07-01 DIAGNOSIS — E669 Obesity, unspecified: Secondary | ICD-10-CM | POA: Diagnosis not present

## 2016-07-01 DIAGNOSIS — J45909 Unspecified asthma, uncomplicated: Secondary | ICD-10-CM | POA: Diagnosis not present

## 2016-07-01 DIAGNOSIS — Z6833 Body mass index (BMI) 33.0-33.9, adult: Secondary | ICD-10-CM | POA: Diagnosis not present

## 2016-07-01 DIAGNOSIS — J309 Allergic rhinitis, unspecified: Secondary | ICD-10-CM | POA: Diagnosis not present

## 2016-07-05 DIAGNOSIS — M961 Postlaminectomy syndrome, not elsewhere classified: Secondary | ICD-10-CM | POA: Diagnosis not present

## 2016-07-05 DIAGNOSIS — M797 Fibromyalgia: Secondary | ICD-10-CM | POA: Diagnosis not present

## 2016-07-05 DIAGNOSIS — M533 Sacrococcygeal disorders, not elsewhere classified: Secondary | ICD-10-CM | POA: Diagnosis not present

## 2016-07-05 DIAGNOSIS — M5137 Other intervertebral disc degeneration, lumbosacral region: Secondary | ICD-10-CM | POA: Diagnosis not present

## 2016-07-19 DIAGNOSIS — M159 Polyosteoarthritis, unspecified: Secondary | ICD-10-CM | POA: Diagnosis not present

## 2016-07-19 DIAGNOSIS — J309 Allergic rhinitis, unspecified: Secondary | ICD-10-CM | POA: Diagnosis not present

## 2016-07-19 DIAGNOSIS — Z6834 Body mass index (BMI) 34.0-34.9, adult: Secondary | ICD-10-CM | POA: Diagnosis not present

## 2016-07-19 DIAGNOSIS — R1310 Dysphagia, unspecified: Secondary | ICD-10-CM | POA: Diagnosis not present

## 2016-09-01 DIAGNOSIS — E669 Obesity, unspecified: Secondary | ICD-10-CM | POA: Diagnosis not present

## 2016-09-01 DIAGNOSIS — J309 Allergic rhinitis, unspecified: Secondary | ICD-10-CM | POA: Diagnosis not present

## 2016-09-01 DIAGNOSIS — Z6834 Body mass index (BMI) 34.0-34.9, adult: Secondary | ICD-10-CM | POA: Diagnosis not present

## 2016-09-01 DIAGNOSIS — K219 Gastro-esophageal reflux disease without esophagitis: Secondary | ICD-10-CM | POA: Diagnosis not present

## 2016-09-01 DIAGNOSIS — E039 Hypothyroidism, unspecified: Secondary | ICD-10-CM | POA: Diagnosis not present

## 2016-09-01 DIAGNOSIS — Z79899 Other long term (current) drug therapy: Secondary | ICD-10-CM | POA: Diagnosis not present

## 2016-09-01 DIAGNOSIS — E559 Vitamin D deficiency, unspecified: Secondary | ICD-10-CM | POA: Diagnosis not present

## 2016-09-01 DIAGNOSIS — I10 Essential (primary) hypertension: Secondary | ICD-10-CM | POA: Diagnosis not present

## 2016-09-01 DIAGNOSIS — D473 Essential (hemorrhagic) thrombocythemia: Secondary | ICD-10-CM | POA: Diagnosis not present

## 2016-09-01 DIAGNOSIS — R600 Localized edema: Secondary | ICD-10-CM | POA: Diagnosis not present

## 2016-09-02 DIAGNOSIS — M797 Fibromyalgia: Secondary | ICD-10-CM | POA: Diagnosis not present

## 2016-09-02 DIAGNOSIS — M961 Postlaminectomy syndrome, not elsewhere classified: Secondary | ICD-10-CM | POA: Diagnosis not present

## 2016-09-02 DIAGNOSIS — M546 Pain in thoracic spine: Secondary | ICD-10-CM | POA: Diagnosis not present

## 2016-09-02 DIAGNOSIS — Z5181 Encounter for therapeutic drug level monitoring: Secondary | ICD-10-CM | POA: Diagnosis not present

## 2016-09-02 DIAGNOSIS — M5416 Radiculopathy, lumbar region: Secondary | ICD-10-CM | POA: Diagnosis not present

## 2016-09-02 DIAGNOSIS — M533 Sacrococcygeal disorders, not elsewhere classified: Secondary | ICD-10-CM | POA: Diagnosis not present

## 2016-09-02 DIAGNOSIS — G894 Chronic pain syndrome: Secondary | ICD-10-CM | POA: Diagnosis not present

## 2016-09-02 DIAGNOSIS — M5137 Other intervertebral disc degeneration, lumbosacral region: Secondary | ICD-10-CM | POA: Diagnosis not present

## 2016-09-09 DIAGNOSIS — D473 Essential (hemorrhagic) thrombocythemia: Secondary | ICD-10-CM | POA: Diagnosis not present

## 2016-10-19 DIAGNOSIS — F331 Major depressive disorder, recurrent, moderate: Secondary | ICD-10-CM | POA: Diagnosis not present

## 2016-11-02 DIAGNOSIS — F331 Major depressive disorder, recurrent, moderate: Secondary | ICD-10-CM | POA: Diagnosis not present

## 2016-11-04 DIAGNOSIS — M533 Sacrococcygeal disorders, not elsewhere classified: Secondary | ICD-10-CM | POA: Diagnosis not present

## 2016-11-04 DIAGNOSIS — M5137 Other intervertebral disc degeneration, lumbosacral region: Secondary | ICD-10-CM | POA: Diagnosis not present

## 2016-11-04 DIAGNOSIS — M961 Postlaminectomy syndrome, not elsewhere classified: Secondary | ICD-10-CM | POA: Diagnosis not present

## 2016-11-04 DIAGNOSIS — M47814 Spondylosis without myelopathy or radiculopathy, thoracic region: Secondary | ICD-10-CM | POA: Diagnosis not present

## 2016-11-04 DIAGNOSIS — M797 Fibromyalgia: Secondary | ICD-10-CM | POA: Diagnosis not present

## 2016-11-19 DIAGNOSIS — F330 Major depressive disorder, recurrent, mild: Secondary | ICD-10-CM | POA: Diagnosis not present

## 2016-11-29 DIAGNOSIS — K5900 Constipation, unspecified: Secondary | ICD-10-CM | POA: Diagnosis not present

## 2016-11-29 DIAGNOSIS — K219 Gastro-esophageal reflux disease without esophagitis: Secondary | ICD-10-CM | POA: Diagnosis not present

## 2016-11-29 DIAGNOSIS — R1310 Dysphagia, unspecified: Secondary | ICD-10-CM | POA: Diagnosis not present

## 2016-11-30 DIAGNOSIS — Z6834 Body mass index (BMI) 34.0-34.9, adult: Secondary | ICD-10-CM | POA: Diagnosis not present

## 2016-11-30 DIAGNOSIS — B372 Candidiasis of skin and nail: Secondary | ICD-10-CM | POA: Diagnosis not present

## 2016-11-30 DIAGNOSIS — E669 Obesity, unspecified: Secondary | ICD-10-CM | POA: Diagnosis not present

## 2016-12-02 DIAGNOSIS — F3341 Major depressive disorder, recurrent, in partial remission: Secondary | ICD-10-CM | POA: Diagnosis not present

## 2016-12-02 DIAGNOSIS — R1310 Dysphagia, unspecified: Secondary | ICD-10-CM | POA: Diagnosis not present

## 2016-12-24 DIAGNOSIS — K2210 Ulcer of esophagus without bleeding: Secondary | ICD-10-CM | POA: Diagnosis not present

## 2016-12-24 DIAGNOSIS — R1310 Dysphagia, unspecified: Secondary | ICD-10-CM | POA: Diagnosis not present

## 2016-12-24 DIAGNOSIS — Z79899 Other long term (current) drug therapy: Secondary | ICD-10-CM | POA: Diagnosis not present

## 2016-12-24 DIAGNOSIS — Z8542 Personal history of malignant neoplasm of other parts of uterus: Secondary | ICD-10-CM | POA: Diagnosis not present

## 2016-12-24 DIAGNOSIS — K210 Gastro-esophageal reflux disease with esophagitis: Secondary | ICD-10-CM | POA: Diagnosis not present

## 2016-12-24 DIAGNOSIS — I10 Essential (primary) hypertension: Secondary | ICD-10-CM | POA: Diagnosis not present

## 2016-12-24 DIAGNOSIS — R110 Nausea: Secondary | ICD-10-CM | POA: Diagnosis not present

## 2016-12-24 DIAGNOSIS — K449 Diaphragmatic hernia without obstruction or gangrene: Secondary | ICD-10-CM | POA: Diagnosis not present

## 2016-12-24 DIAGNOSIS — Z9049 Acquired absence of other specified parts of digestive tract: Secondary | ICD-10-CM | POA: Diagnosis not present

## 2016-12-24 DIAGNOSIS — Z800 Family history of malignant neoplasm of digestive organs: Secondary | ICD-10-CM | POA: Diagnosis not present

## 2016-12-24 DIAGNOSIS — K222 Esophageal obstruction: Secondary | ICD-10-CM | POA: Diagnosis not present

## 2016-12-24 DIAGNOSIS — K219 Gastro-esophageal reflux disease without esophagitis: Secondary | ICD-10-CM | POA: Diagnosis not present

## 2016-12-30 DIAGNOSIS — M961 Postlaminectomy syndrome, not elsewhere classified: Secondary | ICD-10-CM | POA: Diagnosis not present

## 2016-12-30 DIAGNOSIS — M797 Fibromyalgia: Secondary | ICD-10-CM | POA: Diagnosis not present

## 2016-12-30 DIAGNOSIS — M47814 Spondylosis without myelopathy or radiculopathy, thoracic region: Secondary | ICD-10-CM | POA: Diagnosis not present

## 2016-12-30 DIAGNOSIS — M4727 Other spondylosis with radiculopathy, lumbosacral region: Secondary | ICD-10-CM | POA: Diagnosis not present

## 2017-01-19 DIAGNOSIS — R002 Palpitations: Secondary | ICD-10-CM | POA: Diagnosis not present

## 2017-01-19 DIAGNOSIS — N390 Urinary tract infection, site not specified: Secondary | ICD-10-CM | POA: Diagnosis not present

## 2017-01-19 DIAGNOSIS — N343 Urethral syndrome, unspecified: Secondary | ICD-10-CM | POA: Diagnosis not present

## 2017-01-19 DIAGNOSIS — E669 Obesity, unspecified: Secondary | ICD-10-CM | POA: Diagnosis not present

## 2017-01-19 DIAGNOSIS — Z6832 Body mass index (BMI) 32.0-32.9, adult: Secondary | ICD-10-CM | POA: Diagnosis not present

## 2017-01-19 DIAGNOSIS — E782 Mixed hyperlipidemia: Secondary | ICD-10-CM | POA: Diagnosis not present

## 2017-01-19 DIAGNOSIS — F329 Major depressive disorder, single episode, unspecified: Secondary | ICD-10-CM | POA: Diagnosis not present

## 2017-01-28 ENCOUNTER — Ambulatory Visit (INDEPENDENT_AMBULATORY_CARE_PROVIDER_SITE_OTHER): Payer: Medicare Other | Admitting: Cardiology

## 2017-01-28 ENCOUNTER — Encounter: Payer: Self-pay | Admitting: Cardiology

## 2017-01-28 VITALS — BP 130/78 | HR 112 | Ht 67.0 in | Wt 199.8 lb

## 2017-01-28 DIAGNOSIS — R002 Palpitations: Secondary | ICD-10-CM | POA: Diagnosis not present

## 2017-01-28 DIAGNOSIS — R079 Chest pain, unspecified: Secondary | ICD-10-CM

## 2017-01-28 DIAGNOSIS — I10 Essential (primary) hypertension: Secondary | ICD-10-CM

## 2017-01-28 DIAGNOSIS — R0602 Shortness of breath: Secondary | ICD-10-CM

## 2017-01-28 HISTORY — DX: Shortness of breath: R06.02

## 2017-01-28 HISTORY — DX: Palpitations: R00.2

## 2017-01-28 HISTORY — DX: Essential (primary) hypertension: I10

## 2017-01-28 NOTE — Progress Notes (Signed)
Cardiology Office Note:    Date:  01/28/2017   ID:  Lauren Roth, DOB Aug 05, 1945, MRN [id number]  PCP:  Nicholos Johns, MD  Cardiologist:  Jenean Lindau, MD   Referring MD: Nicholos Johns, MD    ASSESSMENT:    1. Shortness of breath on exertion   2. Chest pain, unspecified type   3. Essential hypertension   4. Palpitations    PLAN:    In order of problems listed above:  1. I discussed my findings with her extensively.Her symptoms of palpitations are of concern. She mentions to me that her TSH done at primary care office was normal. I also discussed with her that she needs exercise Lexiscan sestamibi and she is agreeable to assess her chest pain symptoms. Echocardiogram will be done to assess murmur heard on auscultation. The patient will have one week monitoring to assess palpitations. 2. Patient will be seen in follow-up appointment in 6 months or earlier if the patient has any concerns. 3. She knows to go to the nearest emergency room for any concerning symptoms.   Medication Adjustments/Labs and Tests Ordered: Current medicines are reviewed at length with the patient today.  Concerns regarding medicines are outlined above.  Orders Placed This Encounter  Procedures  . Cardiac event monitor  . MYOCARDIAL PERFUSION IMAGING  . EKG 12-Lead  . ECHOCARDIOGRAM COMPLETE   No orders of the defined types were placed in this encounter.    History of Present Illness:    Lauren Roth is a 71 y.o. female who is being seen today for the evaluation of shortness of breath on exertion and at the request of Nicholos Johns, MD. Patient is a pleasant 71 year old female. She has past medical history of essential hypertension. She tells me that she is concerned about palpitations happening on and off. They happen  almost every other day. No orthopnea or PND. No syncope.she also complains of dyspnea on exertion. She has some chest discomfort this is not related to exertion. At the time of my  evaluation she is alert awake oriented and in no distress. She leads a sedentary lifestyle because of arthritis issues involving her knee  History reviewed. No pertinent past medical history.  Past Surgical History:  Procedure Laterality Date  . APPENDECTOMY    . CERVICAL DISC SURGERY    . CHOLECYSTECTOMY    . FRACTURE SURGERY Left    Left arm has a metal plate    Current Medications: Current Meds  Medication Sig  . amitriptyline (ELAVIL) 100 MG tablet Take 1 tablet by mouth daily.  . ARIPiprazole (ABILIFY) 5 MG tablet Take 1 tablet by mouth daily.  Marland Kitchen aspirin EC 81 MG tablet Take 81 mg by mouth daily.  Marland Kitchen buPROPion (WELLBUTRIN XL) 300 MG 24 hr tablet Take 1 tablet by mouth daily.  . cloNIDine (CATAPRES) 0.1 MG tablet Take 1 tablet by mouth 2 (two) times daily.  . diclofenac sodium (VOLTAREN) 1 % GEL Apply 1 % topically as needed.  Marland Kitchen estrogens, conjugated, (PREMARIN) 0.625 MG tablet Take 1 tablet by mouth daily.  . fluconazole (DIFLUCAN) 150 MG tablet   . furosemide (LASIX) 20 MG tablet Take 1 tablet by mouth daily.  Marland Kitchen levothyroxine (SYNTHROID, LEVOTHROID) 25 MCG tablet Take 1 tablet by mouth daily.  Marland Kitchen lisinopril-hydrochlorothiazide (PRINZIDE,ZESTORETIC) 20-12.5 MG tablet Take 1 tablet by mouth daily.  Marland Kitchen loratadine (CLARITIN) 10 MG tablet Take 10 mg by mouth daily.  . metoCLOPramide (REGLAN) 10 MG tablet Take 1 tablet by mouth daily.  Marland Kitchen  morphine (MS CONTIN) 30 MG 12 hr tablet Take 30 mg by mouth every 8 (eight) hours as needed.  . nitroGLYCERIN (NITROSTAT) 0.4 MG SL tablet Place 0.4 mg under the tongue as needed.  Marland Kitchen omeprazole (PRILOSEC) 20 MG capsule Take 1 capsule by mouth daily.  . Oxycodone HCl 10 MG TABS Take 10 mg by mouth 4 (four) times daily as needed.  . potassium chloride (K-DUR) 10 MEQ tablet Take 1 tablet by mouth daily.  Marland Kitchen rOPINIRole (REQUIP) 1 MG tablet Take 1 tablet by mouth daily.  Marland Kitchen tiZANidine (ZANAFLEX) 2 MG tablet Take 2 mg by mouth every 8 (eight) hours as needed.      Allergies:   Cottonseed oil and Codeine sulfate   Social History   Social History  . Marital status: Married    Spouse name: N/A  . Number of children: N/A  . Years of education: N/A   Social History Main Topics  . Smoking status: Former Research scientist (life sciences)  . Smokeless tobacco: Never Used     Comment: at age 68  . Alcohol use No  . Drug use: No  . Sexual activity: Not Asked   Other Topics Concern  . None   Social History Narrative  . None     Family History: The patient's family history includes Deep vein thrombosis in her father; Heart disease in her brother, brother, and father; Heart failure in her father; Pulmonary embolism in her brother.  ROS:   Please see the history of present illness.    All other systems reviewed and are negative.  EKGs/Labs/Other Studies Reviewed:    The following studies were reviewed today: I reviewed office records from primary care physician and discussed with her at length.   Recent Labs: No results found for requested labs within last 8760 hours.  Recent Lipid Panel No results found for: CHOL, TRIG, HDL, CHOLHDL, VLDL, LDLCALC, LDLDIRECT  Physical Exam:    VS:  BP 130/78   Pulse (!) 112   Ht 5\' 7"  (1.702 m)   Wt 199 lb 12.8 oz (90.6 kg)   SpO2 99%   BMI 31.29 kg/m     Wt Readings from Last 3 Encounters:  01/28/17 199 lb 12.8 oz (90.6 kg)  01/16/09 (!) 230 lb (104.3 kg)     GEN: Patient is in no acute distress HEENT: Normal NECK: No JVD; No carotid bruits LYMPHATICS: No lymphadenopathy CARDIAC: S1 S2 regular, 2/6 systolic murmur at the apex. RESPIRATORY:  Clear to auscultation without rales, wheezing or rhonchi  ABDOMEN: Soft, non-tender, non-distended MUSCULOSKELETAL:  No edema; No deformity  SKIN: Warm and dry NEUROLOGIC:  Alert and oriented x 3 PSYCHIATRIC:  Normal affect    Signed, Jenean Lindau, MD  01/28/2017 3:56 PM    [facility]

## 2017-01-28 NOTE — Patient Instructions (Signed)
Medication Instructions:  Your physician recommends that you continue on your current medications as directed. Please refer to the Current Medication list given to you today.   Labwork: None  Testing/Procedures: Your physician has requested that you have an echocardiogram. Echocardiography is a painless test that uses sound waves to create images of your heart. It provides your doctor with information about the size and shape of your heart and how well your heart's chambers and valves are working. This procedure takes approximately one hour. There are no restrictions for this procedure.  Your physician has requested that you have a lexiscan myoview. For further information please visit [email or web address]. Please follow instruction sheet, as given.  Your physician has recommended that you wear an event monitor. Event monitors are medical devices that record the heart's electrical activity. Doctors most often [place] these monitors to diagnose arrhythmias. Arrhythmias are problems with the speed or rhythm of the heartbeat. The monitor is a small, portable device. You can wear one while you do your normal daily activities. This is usually used to diagnose what is causing palpitations/syncope (passing out).  You had an EKG today  Follow-Up: 6 months  Any Other Special Instructions Will Be Listed Below (If Applicable).     If you need a refill on your cardiac medications before your next appointment, please call your pharmacy.

## 2017-01-31 DIAGNOSIS — K210 Gastro-esophageal reflux disease with esophagitis: Secondary | ICD-10-CM | POA: Diagnosis not present

## 2017-01-31 DIAGNOSIS — R110 Nausea: Secondary | ICD-10-CM | POA: Diagnosis not present

## 2017-01-31 DIAGNOSIS — K3184 Gastroparesis: Secondary | ICD-10-CM | POA: Diagnosis not present

## 2017-02-07 DIAGNOSIS — K3184 Gastroparesis: Secondary | ICD-10-CM | POA: Diagnosis not present

## 2017-02-07 DIAGNOSIS — R1013 Epigastric pain: Secondary | ICD-10-CM | POA: Diagnosis not present

## 2017-02-14 DIAGNOSIS — F330 Major depressive disorder, recurrent, mild: Secondary | ICD-10-CM | POA: Diagnosis not present

## 2017-02-15 ENCOUNTER — Telehealth (HOSPITAL_COMMUNITY): Payer: Self-pay | Admitting: *Deleted

## 2017-02-15 NOTE — Telephone Encounter (Signed)
Patient given detailed instructions per Myocardial Perfusion Study Information Sheet for the test on 02/21/17 at 0945. Patient notified to arrive 15 minutes early and that it is imperative to arrive on time for appointment to keep from having the test rescheduled.  If you need to cancel or reschedule your appointment, please call the office within 24 hours of your appointment. . Patient verbalized understanding.Lauren Roth, [name]

## 2017-02-18 DIAGNOSIS — E785 Hyperlipidemia, unspecified: Secondary | ICD-10-CM | POA: Diagnosis not present

## 2017-02-18 DIAGNOSIS — N959 Unspecified menopausal and perimenopausal disorder: Secondary | ICD-10-CM | POA: Diagnosis not present

## 2017-02-18 DIAGNOSIS — Z1231 Encounter for screening mammogram for malignant neoplasm of breast: Secondary | ICD-10-CM | POA: Diagnosis not present

## 2017-02-18 DIAGNOSIS — Z9181 History of falling: Secondary | ICD-10-CM | POA: Diagnosis not present

## 2017-02-18 DIAGNOSIS — Z0000 Encounter for general adult medical examination without abnormal findings: Secondary | ICD-10-CM | POA: Diagnosis not present

## 2017-02-18 DIAGNOSIS — Z6833 Body mass index (BMI) 33.0-33.9, adult: Secondary | ICD-10-CM | POA: Diagnosis not present

## 2017-02-18 DIAGNOSIS — Z23 Encounter for immunization: Secondary | ICD-10-CM | POA: Diagnosis not present

## 2017-02-21 ENCOUNTER — Ambulatory Visit (HOSPITAL_COMMUNITY): Payer: Medicare Other | Attending: Cardiology

## 2017-02-21 ENCOUNTER — Other Ambulatory Visit: Payer: Self-pay | Admitting: Cardiology

## 2017-02-21 ENCOUNTER — Ambulatory Visit: Payer: Medicare Other

## 2017-02-21 ENCOUNTER — Ambulatory Visit (HOSPITAL_BASED_OUTPATIENT_CLINIC_OR_DEPARTMENT_OTHER): Payer: Medicare Other

## 2017-02-21 ENCOUNTER — Other Ambulatory Visit: Payer: Self-pay

## 2017-02-21 DIAGNOSIS — R011 Cardiac murmur, unspecified: Secondary | ICD-10-CM | POA: Diagnosis not present

## 2017-02-21 DIAGNOSIS — R079 Chest pain, unspecified: Secondary | ICD-10-CM

## 2017-02-21 DIAGNOSIS — R002 Palpitations: Secondary | ICD-10-CM

## 2017-02-21 DIAGNOSIS — I272 Pulmonary hypertension, unspecified: Secondary | ICD-10-CM | POA: Insufficient documentation

## 2017-02-21 DIAGNOSIS — R0600 Dyspnea, unspecified: Secondary | ICD-10-CM | POA: Diagnosis not present

## 2017-02-21 DIAGNOSIS — I082 Rheumatic disorders of both aortic and tricuspid valves: Secondary | ICD-10-CM | POA: Insufficient documentation

## 2017-02-21 LAB — MYOCARDIAL PERFUSION IMAGING
CHL CUP NUCLEAR SDS: 2
CHL CUP NUCLEAR SRS: 2
CSEPPHR: 90 {beats}/min
LV dias vol: 94 mL (ref 46–106)
LV sys vol: 33 mL
RATE: 0.31
Rest HR: 72 {beats}/min
SSS: 4
TID: 0.93

## 2017-02-21 LAB — ECHOCARDIOGRAM COMPLETE
Height: 67 in
Weight: 3184 oz

## 2017-02-21 MED ORDER — REGADENOSON 0.4 MG/5ML IV SOLN
0.4000 mg | Freq: Once | INTRAVENOUS | Status: AC
  Administered 2017-02-21: 0.4 mg via INTRAVENOUS

## 2017-02-21 MED ORDER — TECHNETIUM TC 99M TETROFOSMIN IV KIT
30.3000 | PACK | Freq: Once | INTRAVENOUS | Status: AC | PRN
  Administered 2017-02-21: 30.3 via INTRAVENOUS
  Filled 2017-02-21: qty 31

## 2017-02-21 MED ORDER — TECHNETIUM TC 99M TETROFOSMIN IV KIT
8.5000 | PACK | Freq: Once | INTRAVENOUS | Status: AC | PRN
  Administered 2017-02-21: 8.5 via INTRAVENOUS
  Filled 2017-02-21: qty 9

## 2017-02-25 DIAGNOSIS — M797 Fibromyalgia: Secondary | ICD-10-CM | POA: Diagnosis not present

## 2017-02-25 DIAGNOSIS — M47814 Spondylosis without myelopathy or radiculopathy, thoracic region: Secondary | ICD-10-CM | POA: Diagnosis not present

## 2017-02-25 DIAGNOSIS — M533 Sacrococcygeal disorders, not elsewhere classified: Secondary | ICD-10-CM | POA: Diagnosis not present

## 2017-02-25 DIAGNOSIS — M4727 Other spondylosis with radiculopathy, lumbosacral region: Secondary | ICD-10-CM | POA: Diagnosis not present

## 2017-02-25 DIAGNOSIS — M961 Postlaminectomy syndrome, not elsewhere classified: Secondary | ICD-10-CM | POA: Diagnosis not present

## 2017-03-11 DIAGNOSIS — N959 Unspecified menopausal and perimenopausal disorder: Secondary | ICD-10-CM | POA: Diagnosis not present

## 2017-03-11 DIAGNOSIS — M8589 Other specified disorders of bone density and structure, multiple sites: Secondary | ICD-10-CM | POA: Diagnosis not present

## 2017-03-11 DIAGNOSIS — Z1231 Encounter for screening mammogram for malignant neoplasm of breast: Secondary | ICD-10-CM | POA: Diagnosis not present

## 2017-03-16 DIAGNOSIS — F3341 Major depressive disorder, recurrent, in partial remission: Secondary | ICD-10-CM | POA: Diagnosis not present

## 2017-03-31 DIAGNOSIS — I10 Essential (primary) hypertension: Secondary | ICD-10-CM | POA: Diagnosis not present

## 2017-03-31 DIAGNOSIS — E039 Hypothyroidism, unspecified: Secondary | ICD-10-CM | POA: Diagnosis not present

## 2017-03-31 DIAGNOSIS — E669 Obesity, unspecified: Secondary | ICD-10-CM | POA: Diagnosis not present

## 2017-03-31 DIAGNOSIS — D473 Essential (hemorrhagic) thrombocythemia: Secondary | ICD-10-CM | POA: Diagnosis not present

## 2017-03-31 DIAGNOSIS — J309 Allergic rhinitis, unspecified: Secondary | ICD-10-CM | POA: Diagnosis not present

## 2017-03-31 DIAGNOSIS — M1711 Unilateral primary osteoarthritis, right knee: Secondary | ICD-10-CM | POA: Diagnosis not present

## 2017-03-31 DIAGNOSIS — E871 Hypo-osmolality and hyponatremia: Secondary | ICD-10-CM | POA: Diagnosis not present

## 2017-03-31 DIAGNOSIS — E559 Vitamin D deficiency, unspecified: Secondary | ICD-10-CM | POA: Diagnosis not present

## 2017-03-31 DIAGNOSIS — Z6832 Body mass index (BMI) 32.0-32.9, adult: Secondary | ICD-10-CM | POA: Diagnosis not present

## 2017-04-29 DIAGNOSIS — M4727 Other spondylosis with radiculopathy, lumbosacral region: Secondary | ICD-10-CM | POA: Diagnosis not present

## 2017-04-29 DIAGNOSIS — M797 Fibromyalgia: Secondary | ICD-10-CM | POA: Diagnosis not present

## 2017-04-29 DIAGNOSIS — M47814 Spondylosis without myelopathy or radiculopathy, thoracic region: Secondary | ICD-10-CM | POA: Diagnosis not present

## 2017-04-29 DIAGNOSIS — M961 Postlaminectomy syndrome, not elsewhere classified: Secondary | ICD-10-CM | POA: Diagnosis not present

## 2017-04-29 DIAGNOSIS — M5137 Other intervertebral disc degeneration, lumbosacral region: Secondary | ICD-10-CM | POA: Diagnosis not present

## 2017-05-03 DIAGNOSIS — E669 Obesity, unspecified: Secondary | ICD-10-CM | POA: Diagnosis not present

## 2017-05-03 DIAGNOSIS — B372 Candidiasis of skin and nail: Secondary | ICD-10-CM | POA: Diagnosis not present

## 2017-05-12 DIAGNOSIS — F3341 Major depressive disorder, recurrent, in partial remission: Secondary | ICD-10-CM | POA: Diagnosis not present

## 2017-06-21 DIAGNOSIS — E669 Obesity, unspecified: Secondary | ICD-10-CM | POA: Diagnosis not present

## 2017-06-21 DIAGNOSIS — F329 Major depressive disorder, single episode, unspecified: Secondary | ICD-10-CM | POA: Diagnosis not present

## 2017-06-21 DIAGNOSIS — Z6833 Body mass index (BMI) 33.0-33.9, adult: Secondary | ICD-10-CM | POA: Diagnosis not present

## 2017-06-21 DIAGNOSIS — R252 Cramp and spasm: Secondary | ICD-10-CM | POA: Diagnosis not present

## 2017-06-21 DIAGNOSIS — N343 Urethral syndrome, unspecified: Secondary | ICD-10-CM | POA: Diagnosis not present

## 2017-06-27 DIAGNOSIS — M5414 Radiculopathy, thoracic region: Secondary | ICD-10-CM | POA: Diagnosis not present

## 2017-06-27 DIAGNOSIS — M797 Fibromyalgia: Secondary | ICD-10-CM | POA: Diagnosis not present

## 2017-06-27 DIAGNOSIS — M961 Postlaminectomy syndrome, not elsewhere classified: Secondary | ICD-10-CM | POA: Diagnosis not present

## 2017-06-27 DIAGNOSIS — G894 Chronic pain syndrome: Secondary | ICD-10-CM | POA: Diagnosis not present

## 2017-06-27 DIAGNOSIS — M5416 Radiculopathy, lumbar region: Secondary | ICD-10-CM | POA: Diagnosis not present

## 2017-06-27 DIAGNOSIS — M533 Sacrococcygeal disorders, not elsewhere classified: Secondary | ICD-10-CM | POA: Diagnosis not present

## 2017-06-27 DIAGNOSIS — M47814 Spondylosis without myelopathy or radiculopathy, thoracic region: Secondary | ICD-10-CM | POA: Diagnosis not present

## 2017-07-05 DIAGNOSIS — R600 Localized edema: Secondary | ICD-10-CM | POA: Diagnosis not present

## 2017-07-05 DIAGNOSIS — E782 Mixed hyperlipidemia: Secondary | ICD-10-CM | POA: Diagnosis not present

## 2017-07-05 DIAGNOSIS — E039 Hypothyroidism, unspecified: Secondary | ICD-10-CM | POA: Diagnosis not present

## 2017-07-05 DIAGNOSIS — R739 Hyperglycemia, unspecified: Secondary | ICD-10-CM | POA: Diagnosis not present

## 2017-07-05 DIAGNOSIS — E559 Vitamin D deficiency, unspecified: Secondary | ICD-10-CM | POA: Diagnosis not present

## 2017-07-05 DIAGNOSIS — F329 Major depressive disorder, single episode, unspecified: Secondary | ICD-10-CM | POA: Diagnosis not present

## 2017-07-05 DIAGNOSIS — J309 Allergic rhinitis, unspecified: Secondary | ICD-10-CM | POA: Diagnosis not present

## 2017-07-05 DIAGNOSIS — Z6833 Body mass index (BMI) 33.0-33.9, adult: Secondary | ICD-10-CM | POA: Diagnosis not present

## 2017-07-05 DIAGNOSIS — R002 Palpitations: Secondary | ICD-10-CM | POA: Diagnosis not present

## 2017-07-05 DIAGNOSIS — E669 Obesity, unspecified: Secondary | ICD-10-CM | POA: Diagnosis not present

## 2017-07-05 DIAGNOSIS — Z79899 Other long term (current) drug therapy: Secondary | ICD-10-CM | POA: Diagnosis not present

## 2017-07-05 DIAGNOSIS — D473 Essential (hemorrhagic) thrombocythemia: Secondary | ICD-10-CM | POA: Diagnosis not present

## 2017-07-05 DIAGNOSIS — I10 Essential (primary) hypertension: Secondary | ICD-10-CM | POA: Diagnosis not present

## 2017-07-13 DIAGNOSIS — F3342 Major depressive disorder, recurrent, in full remission: Secondary | ICD-10-CM | POA: Diagnosis not present

## 2017-08-22 DIAGNOSIS — M961 Postlaminectomy syndrome, not elsewhere classified: Secondary | ICD-10-CM | POA: Diagnosis not present

## 2017-08-22 DIAGNOSIS — M47814 Spondylosis without myelopathy or radiculopathy, thoracic region: Secondary | ICD-10-CM | POA: Diagnosis not present

## 2017-08-22 DIAGNOSIS — M4727 Other spondylosis with radiculopathy, lumbosacral region: Secondary | ICD-10-CM | POA: Diagnosis not present

## 2017-08-22 DIAGNOSIS — M797 Fibromyalgia: Secondary | ICD-10-CM | POA: Diagnosis not present

## 2017-08-22 DIAGNOSIS — M533 Sacrococcygeal disorders, not elsewhere classified: Secondary | ICD-10-CM | POA: Diagnosis not present

## 2017-09-05 DIAGNOSIS — M2550 Pain in unspecified joint: Secondary | ICD-10-CM | POA: Diagnosis not present

## 2017-09-05 DIAGNOSIS — Z6834 Body mass index (BMI) 34.0-34.9, adult: Secondary | ICD-10-CM | POA: Diagnosis not present

## 2017-09-09 DIAGNOSIS — Z7982 Long term (current) use of aspirin: Secondary | ICD-10-CM | POA: Diagnosis not present

## 2017-09-09 DIAGNOSIS — D473 Essential (hemorrhagic) thrombocythemia: Secondary | ICD-10-CM | POA: Diagnosis not present

## 2017-10-05 DIAGNOSIS — I10 Essential (primary) hypertension: Secondary | ICD-10-CM | POA: Diagnosis not present

## 2017-10-05 DIAGNOSIS — Z6834 Body mass index (BMI) 34.0-34.9, adult: Secondary | ICD-10-CM | POA: Diagnosis not present

## 2017-10-05 DIAGNOSIS — M1711 Unilateral primary osteoarthritis, right knee: Secondary | ICD-10-CM | POA: Diagnosis not present

## 2017-10-05 DIAGNOSIS — E669 Obesity, unspecified: Secondary | ICD-10-CM | POA: Diagnosis not present

## 2017-10-05 DIAGNOSIS — E782 Mixed hyperlipidemia: Secondary | ICD-10-CM | POA: Diagnosis not present

## 2017-10-10 DIAGNOSIS — F3342 Major depressive disorder, recurrent, in full remission: Secondary | ICD-10-CM | POA: Diagnosis not present

## 2017-10-18 DIAGNOSIS — J029 Acute pharyngitis, unspecified: Secondary | ICD-10-CM | POA: Diagnosis not present

## 2017-10-18 DIAGNOSIS — Z6834 Body mass index (BMI) 34.0-34.9, adult: Secondary | ICD-10-CM | POA: Diagnosis not present

## 2017-10-18 DIAGNOSIS — E669 Obesity, unspecified: Secondary | ICD-10-CM | POA: Diagnosis not present

## 2017-10-18 DIAGNOSIS — J209 Acute bronchitis, unspecified: Secondary | ICD-10-CM | POA: Diagnosis not present

## 2017-10-18 DIAGNOSIS — N390 Urinary tract infection, site not specified: Secondary | ICD-10-CM | POA: Diagnosis not present

## 2017-10-19 DIAGNOSIS — N390 Urinary tract infection, site not specified: Secondary | ICD-10-CM | POA: Diagnosis not present

## 2017-10-31 DIAGNOSIS — M5137 Other intervertebral disc degeneration, lumbosacral region: Secondary | ICD-10-CM | POA: Diagnosis not present

## 2017-10-31 DIAGNOSIS — M47814 Spondylosis without myelopathy or radiculopathy, thoracic region: Secondary | ICD-10-CM | POA: Diagnosis not present

## 2017-10-31 DIAGNOSIS — M961 Postlaminectomy syndrome, not elsewhere classified: Secondary | ICD-10-CM | POA: Diagnosis not present

## 2017-10-31 DIAGNOSIS — M797 Fibromyalgia: Secondary | ICD-10-CM | POA: Diagnosis not present

## 2017-11-22 DIAGNOSIS — Z6834 Body mass index (BMI) 34.0-34.9, adult: Secondary | ICD-10-CM | POA: Diagnosis not present

## 2017-11-22 DIAGNOSIS — R42 Dizziness and giddiness: Secondary | ICD-10-CM | POA: Diagnosis not present

## 2017-11-22 DIAGNOSIS — J309 Allergic rhinitis, unspecified: Secondary | ICD-10-CM | POA: Diagnosis not present

## 2017-11-22 DIAGNOSIS — L989 Disorder of the skin and subcutaneous tissue, unspecified: Secondary | ICD-10-CM | POA: Diagnosis not present

## 2017-11-25 ENCOUNTER — Ambulatory Visit (INDEPENDENT_AMBULATORY_CARE_PROVIDER_SITE_OTHER): Payer: Medicare Other | Admitting: Cardiology

## 2017-11-25 ENCOUNTER — Encounter: Payer: Self-pay | Admitting: Cardiology

## 2017-11-25 VITALS — BP 118/68 | HR 81 | Ht 67.0 in | Wt 200.0 lb

## 2017-11-25 DIAGNOSIS — I10 Essential (primary) hypertension: Secondary | ICD-10-CM

## 2017-11-25 DIAGNOSIS — D473 Essential (hemorrhagic) thrombocythemia: Secondary | ICD-10-CM | POA: Insufficient documentation

## 2017-11-25 DIAGNOSIS — R0602 Shortness of breath: Secondary | ICD-10-CM | POA: Diagnosis not present

## 2017-11-25 DIAGNOSIS — Z01810 Encounter for preprocedural cardiovascular examination: Secondary | ICD-10-CM | POA: Diagnosis not present

## 2017-11-25 HISTORY — DX: Encounter for preprocedural cardiovascular examination: Z01.810

## 2017-11-25 HISTORY — DX: Essential (hemorrhagic) thrombocythemia: D47.3

## 2017-11-25 NOTE — Progress Notes (Signed)
Cardiology Office Note:    Date:  11/25/2017   ID:  Lauren Roth, DOB 1946/01/10, MRN [id number]  PCP:  Lauren Johns, MD  Cardiologist:  Jenean Lindau, MD   Referring MD: Lauren Johns, MD    ASSESSMENT:    1. Pre-operative cardiovascular examination   2. Shortness of breath on exertion   3. Essential hypertension   4. Essential thrombocytosis (HCC)    PLAN:    In order of problems listed above:  1. I discussed my findings with the patient at extensive length.  Her blood pressure is stable.  Diet was discussed for obesity and risks of obesity explained. 2. Echocardiogram will be done to assess her mitral regurgitation and valvular heart disease issues. 3. In view of her sedentary lifestyle and multiple risk factors for coronary artery disease we will do a Lexiscan sestamibi.  If these are negative and she is not at high risk for coronary events during the aforementioned surgery.  Meticulous hemodynamic monitoring will further reduce the risk of coronary events.   Medication Adjustments/Labs and Tests Ordered: Current medicines are reviewed at length with the patient today.  Concerns regarding medicines are outlined above.  No orders of the defined types were placed in this encounter.  No orders of the defined types were placed in this encounter.    No chief complaint on file.    History of Present Illness:    Lauren Roth is a 72 y.o. female.  Patient is a pleasant lady.  She has past medical history of essential hypertension, obesity and mild to moderate aortic regurgitation.  She leads a sedentary lifestyle and is contemplating knee surgery.  She wants to be evaluated from a cardiovascular standpoint with respect to these issues.  Again she denies any chest pain orthopnea or PND.  At the time of my evaluation, the patient is alert awake oriented and in no distress.  History reviewed. No pertinent past medical history.  Past Surgical History:  Procedure Laterality Date   . APPENDECTOMY    . CERVICAL DISC SURGERY    . CHOLECYSTECTOMY    . FRACTURE SURGERY Left    Left arm has a metal plate    Current Medications: Current Meds  Medication Sig  . amitriptyline (ELAVIL) 100 MG tablet Take 1 tablet by mouth daily.  . ARIPiprazole (ABILIFY) 5 MG tablet Take 1 tablet by mouth daily.  Marland Kitchen aspirin EC 81 MG tablet Take 81 mg by mouth daily.  Marland Kitchen buPROPion (WELLBUTRIN XL) 300 MG 24 hr tablet Take 1 tablet by mouth daily.  . cloNIDine (CATAPRES) 0.1 MG tablet Take 1 tablet by mouth 2 (two) times daily.  . diclofenac sodium (VOLTAREN) 1 % GEL Apply 1 % topically as needed.  Marland Kitchen estrogens, conjugated, (PREMARIN) 0.625 MG tablet Take 1 tablet by mouth daily.  . fluconazole (DIFLUCAN) 150 MG tablet   . furosemide (LASIX) 20 MG tablet Take 1 tablet by mouth daily.  Marland Kitchen levothyroxine (SYNTHROID, LEVOTHROID) 25 MCG tablet Take 1 tablet by mouth daily.  Marland Kitchen lisinopril-hydrochlorothiazide (PRINZIDE,ZESTORETIC) 20-12.5 MG tablet Take 1 tablet by mouth daily.  Marland Kitchen loratadine (CLARITIN) 10 MG tablet Take 10 mg by mouth daily.  . metoCLOPramide (REGLAN) 10 MG tablet Take 1 tablet by mouth daily.  Marland Kitchen morphine (MS CONTIN) 30 MG 12 hr tablet Take 30 mg by mouth every 8 (eight) hours as needed.  . nitroGLYCERIN (NITROSTAT) 0.4 MG SL tablet Place 0.4 mg under the tongue as needed.  Marland Kitchen omeprazole (PRILOSEC) 20 MG  capsule Take 1 capsule by mouth daily.  . Oxycodone HCl 10 MG TABS Take 10 mg by mouth 4 (four) times daily as needed.  . potassium chloride (K-DUR) 10 MEQ tablet Take 1 tablet by mouth daily.  Marland Kitchen rOPINIRole (REQUIP) 1 MG tablet Take 1 tablet by mouth daily.  Marland Kitchen tiZANidine (ZANAFLEX) 2 MG tablet Take 2 mg by mouth every 8 (eight) hours as needed.     Allergies:   Cottonseed oil and Codeine sulfate   Social History   Socioeconomic History  . Marital status: Married    Spouse name: Not on file  . Number of children: Not on file  . Years of education: Not on file  . Highest  education level: Not on file  Occupational History  . Not on file  Social Needs  . Financial resource strain: Not on file  . Food insecurity:    Worry: Not on file    Inability: Not on file  . Transportation needs:    Medical: Not on file    Non-medical: Not on file  Tobacco Use  . Smoking status: Former Research scientist (life sciences)  . Smokeless tobacco: Never Used  . Tobacco comment: at age 14  Substance and Sexual Activity  . Alcohol use: No  . Drug use: No  . Sexual activity: Not on file  Lifestyle  . Physical activity:    Days per week: Not on file    Minutes per session: Not on file  . Stress: Not on file  Relationships  . Social connections:    Talks on phone: Not on file    Gets together: Not on file    Attends religious service: Not on file    Active member of club or organization: Not on file    Attends meetings of clubs or organizations: Not on file    Relationship status: Not on file  Other Topics Concern  . Not on file  Social History Narrative  . Not on file     Family History: The patient's family history includes Deep vein thrombosis in her father; Heart disease in her brother, brother, and father; Heart failure in her father; Pulmonary embolism in her brother.  ROS:   Please see the history of present illness.    All other systems reviewed and are negative.  EKGs/Labs/Other Studies Reviewed:    The following studies were reviewed today: I discussed my findings with the patient at extensive length.   Recent Labs: No results found for requested labs within last 8760 hours.  Recent Lipid Panel No results found for: CHOL, TRIG, HDL, CHOLHDL, VLDL, LDLCALC, LDLDIRECT  Physical Exam:    VS:  BP 118/68 (BP Location: Right Arm, Patient Position: Sitting, Cuff Size: Normal)   Pulse 81   Ht 5\' 7"  (1.702 m)   Wt 200 lb (90.7 kg)   SpO2 98%   BMI 31.32 kg/m     Wt Readings from Last 3 Encounters:  11/25/17 200 lb (90.7 kg)  02/21/17 199 lb (90.3 kg)  01/28/17 199 lb  12.8 oz (90.6 kg)     GEN: Patient is in no acute distress HEENT: Normal NECK: No JVD; No carotid bruits LYMPHATICS: No lymphadenopathy CARDIAC: Hear sounds regular, 2/6 systolic murmur at the apex. RESPIRATORY:  Clear to auscultation without rales, wheezing or rhonchi  ABDOMEN: Soft, non-tender, non-distended MUSCULOSKELETAL:  No edema; No deformity  SKIN: Warm and dry NEUROLOGIC:  Alert and oriented x 3 PSYCHIATRIC:  Normal affect   Signed, Reita Cliche Rylie Knierim,  MD  11/25/2017 2:29 PM    [facility]

## 2017-11-25 NOTE — Patient Instructions (Signed)
Medication Instructions:  Your physician recommends that you continue on your current medications as directed. Please refer to the Current Medication list given to you today.  Labwork: none  Testing/Procedures: Your physician has requested that you have an echocardiogram. Echocardiography is a painless test that uses sound waves to create images of your heart. It provides your doctor with information about the size and shape of your heart and how well your heart's chambers and valves are working. This procedure takes approximately one hour. There are no restrictions for this procedure.  Your physician has requested that you have a lexiscan myoview. For further information please visit [email or web address]. Please follow instruction sheet, as given.  [facility] [address] [place], [place] [address] [phone]  Lexiscan testing instructions:  Please present to [facility] 15 minutes earlier than your appointment time to allow for registration.  You will be called with an appointment date and time by our office once it has been scheduled; please allow at least 48 hours for [place] to contact you.  No food or drink after midnight prior to your test (except for small sips of water with your medications).  Hold the following medications the morning of your test: furosemide  Bring a medication list or all your medications with you the morning of the testing.  No caffeine, decaffeinated or chocolate products 12 hours prior to the testing.  Please be aware that the test can take up to 3-4 hours.  Should you have any problem with the appointment date or time, please call [phone].   Please call the office with any further questions or concerns.    Follow-Up: Your physician recommends that you schedule a follow-up appointment in: 6 months  Any Other Special Instructions Will Be Listed Below (If Applicable).     If you need a refill on your cardiac  medications before your next appointment, please call your pharmacy.   [place], RN, BSN

## 2017-11-28 DIAGNOSIS — R799 Abnormal finding of blood chemistry, unspecified: Secondary | ICD-10-CM | POA: Diagnosis not present

## 2017-11-28 DIAGNOSIS — M1711 Unilateral primary osteoarthritis, right knee: Secondary | ICD-10-CM | POA: Diagnosis not present

## 2017-11-28 DIAGNOSIS — G8929 Other chronic pain: Secondary | ICD-10-CM | POA: Diagnosis not present

## 2017-11-28 DIAGNOSIS — I10 Essential (primary) hypertension: Secondary | ICD-10-CM | POA: Diagnosis not present

## 2017-11-28 DIAGNOSIS — M21061 Valgus deformity, not elsewhere classified, right knee: Secondary | ICD-10-CM | POA: Diagnosis not present

## 2017-12-05 DIAGNOSIS — R079 Chest pain, unspecified: Secondary | ICD-10-CM | POA: Diagnosis not present

## 2017-12-05 DIAGNOSIS — Z01810 Encounter for preprocedural cardiovascular examination: Secondary | ICD-10-CM | POA: Diagnosis not present

## 2017-12-12 DIAGNOSIS — Z01818 Encounter for other preprocedural examination: Secondary | ICD-10-CM | POA: Diagnosis not present

## 2017-12-12 DIAGNOSIS — Z6834 Body mass index (BMI) 34.0-34.9, adult: Secondary | ICD-10-CM | POA: Diagnosis not present

## 2017-12-12 DIAGNOSIS — Z79899 Other long term (current) drug therapy: Secondary | ICD-10-CM | POA: Diagnosis not present

## 2017-12-12 DIAGNOSIS — E669 Obesity, unspecified: Secondary | ICD-10-CM | POA: Diagnosis not present

## 2017-12-21 DIAGNOSIS — Z01818 Encounter for other preprocedural examination: Secondary | ICD-10-CM | POA: Diagnosis not present

## 2017-12-21 DIAGNOSIS — M1711 Unilateral primary osteoarthritis, right knee: Secondary | ICD-10-CM | POA: Diagnosis not present

## 2017-12-21 DIAGNOSIS — Z885 Allergy status to narcotic agent status: Secondary | ICD-10-CM | POA: Diagnosis not present

## 2017-12-22 DIAGNOSIS — Z01818 Encounter for other preprocedural examination: Secondary | ICD-10-CM | POA: Diagnosis not present

## 2017-12-22 DIAGNOSIS — M1711 Unilateral primary osteoarthritis, right knee: Secondary | ICD-10-CM | POA: Diagnosis not present

## 2017-12-26 DIAGNOSIS — M47814 Spondylosis without myelopathy or radiculopathy, thoracic region: Secondary | ICD-10-CM | POA: Diagnosis not present

## 2017-12-26 DIAGNOSIS — M797 Fibromyalgia: Secondary | ICD-10-CM | POA: Diagnosis not present

## 2017-12-26 DIAGNOSIS — M961 Postlaminectomy syndrome, not elsewhere classified: Secondary | ICD-10-CM | POA: Diagnosis not present

## 2017-12-26 DIAGNOSIS — M533 Sacrococcygeal disorders, not elsewhere classified: Secondary | ICD-10-CM | POA: Diagnosis not present

## 2017-12-28 DIAGNOSIS — T25121A Burn of first degree of right foot, initial encounter: Secondary | ICD-10-CM | POA: Diagnosis not present

## 2018-01-02 DIAGNOSIS — T310 Burns involving less than 10% of body surface: Secondary | ICD-10-CM | POA: Diagnosis not present

## 2018-01-02 DIAGNOSIS — E669 Obesity, unspecified: Secondary | ICD-10-CM | POA: Diagnosis not present

## 2018-01-02 DIAGNOSIS — Z23 Encounter for immunization: Secondary | ICD-10-CM | POA: Diagnosis not present

## 2018-01-02 DIAGNOSIS — T300 Burn of unspecified body region, unspecified degree: Secondary | ICD-10-CM | POA: Diagnosis not present

## 2018-01-02 DIAGNOSIS — Z6834 Body mass index (BMI) 34.0-34.9, adult: Secondary | ICD-10-CM | POA: Diagnosis not present

## 2018-01-02 DIAGNOSIS — Z09 Encounter for follow-up examination after completed treatment for conditions other than malignant neoplasm: Secondary | ICD-10-CM | POA: Diagnosis not present

## 2018-01-09 DIAGNOSIS — Z1339 Encounter for screening examination for other mental health and behavioral disorders: Secondary | ICD-10-CM | POA: Diagnosis not present

## 2018-01-09 DIAGNOSIS — E782 Mixed hyperlipidemia: Secondary | ICD-10-CM | POA: Diagnosis not present

## 2018-01-09 DIAGNOSIS — Z6834 Body mass index (BMI) 34.0-34.9, adult: Secondary | ICD-10-CM | POA: Diagnosis not present

## 2018-01-09 DIAGNOSIS — T300 Burn of unspecified body region, unspecified degree: Secondary | ICD-10-CM | POA: Diagnosis not present

## 2018-01-13 ENCOUNTER — Other Ambulatory Visit: Payer: Medicare Other

## 2018-01-18 DIAGNOSIS — F3341 Major depressive disorder, recurrent, in partial remission: Secondary | ICD-10-CM | POA: Diagnosis not present

## 2018-01-19 DIAGNOSIS — T300 Burn of unspecified body region, unspecified degree: Secondary | ICD-10-CM | POA: Diagnosis not present

## 2018-01-19 DIAGNOSIS — Z6834 Body mass index (BMI) 34.0-34.9, adult: Secondary | ICD-10-CM | POA: Diagnosis not present

## 2018-01-19 DIAGNOSIS — Z23 Encounter for immunization: Secondary | ICD-10-CM | POA: Diagnosis not present

## 2018-02-20 DIAGNOSIS — D2239 Melanocytic nevi of other parts of face: Secondary | ICD-10-CM | POA: Diagnosis not present

## 2018-02-20 DIAGNOSIS — L820 Inflamed seborrheic keratosis: Secondary | ICD-10-CM | POA: Diagnosis not present

## 2018-02-20 DIAGNOSIS — L239 Allergic contact dermatitis, unspecified cause: Secondary | ICD-10-CM | POA: Diagnosis not present

## 2018-03-01 DIAGNOSIS — H269 Unspecified cataract: Secondary | ICD-10-CM | POA: Diagnosis not present

## 2018-03-01 DIAGNOSIS — Z01818 Encounter for other preprocedural examination: Secondary | ICD-10-CM | POA: Diagnosis not present

## 2018-03-01 DIAGNOSIS — Z6834 Body mass index (BMI) 34.0-34.9, adult: Secondary | ICD-10-CM | POA: Diagnosis not present

## 2018-03-01 DIAGNOSIS — E669 Obesity, unspecified: Secondary | ICD-10-CM | POA: Diagnosis not present

## 2018-03-06 ENCOUNTER — Other Ambulatory Visit: Payer: Self-pay

## 2018-03-07 DIAGNOSIS — M961 Postlaminectomy syndrome, not elsewhere classified: Secondary | ICD-10-CM | POA: Diagnosis not present

## 2018-03-07 DIAGNOSIS — M47814 Spondylosis without myelopathy or radiculopathy, thoracic region: Secondary | ICD-10-CM | POA: Diagnosis not present

## 2018-03-07 DIAGNOSIS — M533 Sacrococcygeal disorders, not elsewhere classified: Secondary | ICD-10-CM | POA: Diagnosis not present

## 2018-03-07 DIAGNOSIS — M797 Fibromyalgia: Secondary | ICD-10-CM | POA: Diagnosis not present

## 2018-03-20 DIAGNOSIS — M47814 Spondylosis without myelopathy or radiculopathy, thoracic region: Secondary | ICD-10-CM | POA: Diagnosis not present

## 2018-03-20 DIAGNOSIS — M5124 Other intervertebral disc displacement, thoracic region: Secondary | ICD-10-CM | POA: Diagnosis not present

## 2018-03-20 DIAGNOSIS — M899 Disorder of bone, unspecified: Secondary | ICD-10-CM | POA: Diagnosis not present

## 2018-03-21 DIAGNOSIS — J019 Acute sinusitis, unspecified: Secondary | ICD-10-CM | POA: Diagnosis not present

## 2018-03-21 DIAGNOSIS — Z6834 Body mass index (BMI) 34.0-34.9, adult: Secondary | ICD-10-CM | POA: Diagnosis not present

## 2018-03-21 DIAGNOSIS — E669 Obesity, unspecified: Secondary | ICD-10-CM | POA: Diagnosis not present

## 2018-03-21 DIAGNOSIS — G894 Chronic pain syndrome: Secondary | ICD-10-CM | POA: Diagnosis not present

## 2018-04-05 DIAGNOSIS — M47814 Spondylosis without myelopathy or radiculopathy, thoracic region: Secondary | ICD-10-CM | POA: Diagnosis not present

## 2018-04-06 DIAGNOSIS — Z6834 Body mass index (BMI) 34.0-34.9, adult: Secondary | ICD-10-CM | POA: Diagnosis not present

## 2018-04-06 DIAGNOSIS — I10 Essential (primary) hypertension: Secondary | ICD-10-CM | POA: Diagnosis not present

## 2018-04-06 DIAGNOSIS — Z9181 History of falling: Secondary | ICD-10-CM | POA: Diagnosis not present

## 2018-04-06 DIAGNOSIS — M1711 Unilateral primary osteoarthritis, right knee: Secondary | ICD-10-CM | POA: Diagnosis not present

## 2018-04-10 DIAGNOSIS — F3341 Major depressive disorder, recurrent, in partial remission: Secondary | ICD-10-CM | POA: Diagnosis not present

## 2018-04-28 DIAGNOSIS — H2511 Age-related nuclear cataract, right eye: Secondary | ICD-10-CM | POA: Diagnosis not present

## 2018-05-02 DIAGNOSIS — M47814 Spondylosis without myelopathy or radiculopathy, thoracic region: Secondary | ICD-10-CM | POA: Diagnosis not present

## 2018-05-02 DIAGNOSIS — M1990 Unspecified osteoarthritis, unspecified site: Secondary | ICD-10-CM | POA: Diagnosis not present

## 2018-05-02 DIAGNOSIS — M533 Sacrococcygeal disorders, not elsewhere classified: Secondary | ICD-10-CM | POA: Diagnosis not present

## 2018-05-02 DIAGNOSIS — M4727 Other spondylosis with radiculopathy, lumbosacral region: Secondary | ICD-10-CM | POA: Diagnosis not present

## 2018-05-02 DIAGNOSIS — M961 Postlaminectomy syndrome, not elsewhere classified: Secondary | ICD-10-CM | POA: Diagnosis not present

## 2018-05-08 DIAGNOSIS — E782 Mixed hyperlipidemia: Secondary | ICD-10-CM | POA: Diagnosis not present

## 2018-05-08 DIAGNOSIS — Z6834 Body mass index (BMI) 34.0-34.9, adult: Secondary | ICD-10-CM | POA: Diagnosis not present

## 2018-05-08 DIAGNOSIS — Z131 Encounter for screening for diabetes mellitus: Secondary | ICD-10-CM | POA: Diagnosis not present

## 2018-05-08 DIAGNOSIS — I10 Essential (primary) hypertension: Secondary | ICD-10-CM | POA: Diagnosis not present

## 2018-05-08 DIAGNOSIS — M2550 Pain in unspecified joint: Secondary | ICD-10-CM | POA: Diagnosis not present

## 2018-05-08 DIAGNOSIS — Z79899 Other long term (current) drug therapy: Secondary | ICD-10-CM | POA: Diagnosis not present

## 2018-05-15 DIAGNOSIS — H25811 Combined forms of age-related cataract, right eye: Secondary | ICD-10-CM | POA: Diagnosis not present

## 2018-05-15 DIAGNOSIS — H2511 Age-related nuclear cataract, right eye: Secondary | ICD-10-CM | POA: Diagnosis not present

## 2018-05-22 DIAGNOSIS — H2512 Age-related nuclear cataract, left eye: Secondary | ICD-10-CM | POA: Diagnosis not present

## 2018-05-22 DIAGNOSIS — H25812 Combined forms of age-related cataract, left eye: Secondary | ICD-10-CM | POA: Diagnosis not present

## 2018-06-21 DIAGNOSIS — Z23 Encounter for immunization: Secondary | ICD-10-CM | POA: Diagnosis not present

## 2018-06-21 DIAGNOSIS — Z1211 Encounter for screening for malignant neoplasm of colon: Secondary | ICD-10-CM | POA: Diagnosis not present

## 2018-06-21 DIAGNOSIS — Z1331 Encounter for screening for depression: Secondary | ICD-10-CM | POA: Diagnosis not present

## 2018-06-21 DIAGNOSIS — Z0000 Encounter for general adult medical examination without abnormal findings: Secondary | ICD-10-CM | POA: Diagnosis not present

## 2018-06-21 DIAGNOSIS — Z9181 History of falling: Secondary | ICD-10-CM | POA: Diagnosis not present

## 2018-06-21 DIAGNOSIS — Z6835 Body mass index (BMI) 35.0-35.9, adult: Secondary | ICD-10-CM | POA: Diagnosis not present

## 2018-06-21 DIAGNOSIS — N959 Unspecified menopausal and perimenopausal disorder: Secondary | ICD-10-CM | POA: Diagnosis not present

## 2018-06-21 DIAGNOSIS — E785 Hyperlipidemia, unspecified: Secondary | ICD-10-CM | POA: Diagnosis not present

## 2018-06-21 DIAGNOSIS — Z1231 Encounter for screening mammogram for malignant neoplasm of breast: Secondary | ICD-10-CM | POA: Diagnosis not present

## 2018-07-03 DIAGNOSIS — M47814 Spondylosis without myelopathy or radiculopathy, thoracic region: Secondary | ICD-10-CM | POA: Diagnosis not present

## 2018-07-03 DIAGNOSIS — M4727 Other spondylosis with radiculopathy, lumbosacral region: Secondary | ICD-10-CM | POA: Diagnosis not present

## 2018-07-03 DIAGNOSIS — M533 Sacrococcygeal disorders, not elsewhere classified: Secondary | ICD-10-CM | POA: Diagnosis not present

## 2018-07-03 DIAGNOSIS — M1990 Unspecified osteoarthritis, unspecified site: Secondary | ICD-10-CM | POA: Diagnosis not present

## 2018-07-03 DIAGNOSIS — M797 Fibromyalgia: Secondary | ICD-10-CM | POA: Diagnosis not present

## 2018-07-04 DIAGNOSIS — R600 Localized edema: Secondary | ICD-10-CM | POA: Diagnosis not present

## 2018-07-04 DIAGNOSIS — Z6835 Body mass index (BMI) 35.0-35.9, adult: Secondary | ICD-10-CM | POA: Diagnosis not present

## 2018-07-04 DIAGNOSIS — H68003 Unspecified Eustachian salpingitis, bilateral: Secondary | ICD-10-CM | POA: Diagnosis not present

## 2018-07-04 DIAGNOSIS — M1710 Unilateral primary osteoarthritis, unspecified knee: Secondary | ICD-10-CM | POA: Diagnosis not present

## 2018-07-18 DIAGNOSIS — F3341 Major depressive disorder, recurrent, in partial remission: Secondary | ICD-10-CM | POA: Diagnosis not present

## 2018-07-19 DIAGNOSIS — M170 Bilateral primary osteoarthritis of knee: Secondary | ICD-10-CM | POA: Diagnosis not present

## 2018-07-19 DIAGNOSIS — I10 Essential (primary) hypertension: Secondary | ICD-10-CM | POA: Diagnosis not present

## 2018-07-19 DIAGNOSIS — J309 Allergic rhinitis, unspecified: Secondary | ICD-10-CM | POA: Diagnosis not present

## 2018-07-19 DIAGNOSIS — M25552 Pain in left hip: Secondary | ICD-10-CM | POA: Diagnosis not present

## 2018-08-17 DIAGNOSIS — Z6834 Body mass index (BMI) 34.0-34.9, adult: Secondary | ICD-10-CM | POA: Diagnosis not present

## 2018-08-17 DIAGNOSIS — M545 Low back pain: Secondary | ICD-10-CM | POA: Diagnosis not present

## 2018-09-04 DIAGNOSIS — M4727 Other spondylosis with radiculopathy, lumbosacral region: Secondary | ICD-10-CM | POA: Diagnosis not present

## 2018-09-04 DIAGNOSIS — M1990 Unspecified osteoarthritis, unspecified site: Secondary | ICD-10-CM | POA: Diagnosis not present

## 2018-09-04 DIAGNOSIS — M533 Sacrococcygeal disorders, not elsewhere classified: Secondary | ICD-10-CM | POA: Diagnosis not present

## 2018-09-04 DIAGNOSIS — M47814 Spondylosis without myelopathy or radiculopathy, thoracic region: Secondary | ICD-10-CM | POA: Diagnosis not present

## 2018-10-26 DIAGNOSIS — M1612 Unilateral primary osteoarthritis, left hip: Secondary | ICD-10-CM | POA: Diagnosis not present

## 2018-10-26 DIAGNOSIS — G4700 Insomnia, unspecified: Secondary | ICD-10-CM | POA: Diagnosis not present

## 2018-10-26 DIAGNOSIS — I10 Essential (primary) hypertension: Secondary | ICD-10-CM | POA: Diagnosis not present

## 2018-10-26 DIAGNOSIS — R600 Localized edema: Secondary | ICD-10-CM | POA: Diagnosis not present

## 2018-10-27 DIAGNOSIS — R739 Hyperglycemia, unspecified: Secondary | ICD-10-CM | POA: Diagnosis not present

## 2018-10-27 DIAGNOSIS — Z79899 Other long term (current) drug therapy: Secondary | ICD-10-CM | POA: Diagnosis not present

## 2018-10-27 DIAGNOSIS — M5440 Lumbago with sciatica, unspecified side: Secondary | ICD-10-CM | POA: Diagnosis not present

## 2018-10-27 DIAGNOSIS — M1612 Unilateral primary osteoarthritis, left hip: Secondary | ICD-10-CM | POA: Diagnosis not present

## 2018-10-27 DIAGNOSIS — E039 Hypothyroidism, unspecified: Secondary | ICD-10-CM | POA: Diagnosis not present

## 2018-10-27 DIAGNOSIS — E559 Vitamin D deficiency, unspecified: Secondary | ICD-10-CM | POA: Diagnosis not present

## 2018-10-27 DIAGNOSIS — E782 Mixed hyperlipidemia: Secondary | ICD-10-CM | POA: Diagnosis not present

## 2018-11-02 DIAGNOSIS — M5137 Other intervertebral disc degeneration, lumbosacral region: Secondary | ICD-10-CM | POA: Diagnosis not present

## 2018-11-02 DIAGNOSIS — M533 Sacrococcygeal disorders, not elsewhere classified: Secondary | ICD-10-CM | POA: Diagnosis not present

## 2018-11-02 DIAGNOSIS — M47814 Spondylosis without myelopathy or radiculopathy, thoracic region: Secondary | ICD-10-CM | POA: Diagnosis not present

## 2018-11-02 DIAGNOSIS — M961 Postlaminectomy syndrome, not elsewhere classified: Secondary | ICD-10-CM | POA: Diagnosis not present

## 2018-11-23 DIAGNOSIS — E871 Hypo-osmolality and hyponatremia: Secondary | ICD-10-CM | POA: Diagnosis not present

## 2018-12-20 DIAGNOSIS — M1612 Unilateral primary osteoarthritis, left hip: Secondary | ICD-10-CM | POA: Diagnosis not present

## 2018-12-20 DIAGNOSIS — K5900 Constipation, unspecified: Secondary | ICD-10-CM | POA: Diagnosis not present

## 2018-12-20 DIAGNOSIS — Z86010 Personal history of colonic polyps: Secondary | ICD-10-CM | POA: Diagnosis not present

## 2018-12-20 DIAGNOSIS — M7062 Trochanteric bursitis, left hip: Secondary | ICD-10-CM | POA: Diagnosis not present

## 2018-12-20 DIAGNOSIS — E871 Hypo-osmolality and hyponatremia: Secondary | ICD-10-CM | POA: Diagnosis not present

## 2018-12-28 DIAGNOSIS — K5730 Diverticulosis of large intestine without perforation or abscess without bleeding: Secondary | ICD-10-CM | POA: Diagnosis not present

## 2018-12-28 DIAGNOSIS — M26609 Unspecified temporomandibular joint disorder, unspecified side: Secondary | ICD-10-CM | POA: Diagnosis not present

## 2018-12-28 DIAGNOSIS — K5900 Constipation, unspecified: Secondary | ICD-10-CM | POA: Diagnosis not present

## 2018-12-28 DIAGNOSIS — Z79899 Other long term (current) drug therapy: Secondary | ICD-10-CM | POA: Diagnosis not present

## 2018-12-28 DIAGNOSIS — D122 Benign neoplasm of ascending colon: Secondary | ICD-10-CM | POA: Diagnosis not present

## 2018-12-28 DIAGNOSIS — I10 Essential (primary) hypertension: Secondary | ICD-10-CM | POA: Diagnosis not present

## 2018-12-28 DIAGNOSIS — M797 Fibromyalgia: Secondary | ICD-10-CM | POA: Diagnosis not present

## 2018-12-28 DIAGNOSIS — Z9049 Acquired absence of other specified parts of digestive tract: Secondary | ICD-10-CM | POA: Diagnosis not present

## 2018-12-28 DIAGNOSIS — D126 Benign neoplasm of colon, unspecified: Secondary | ICD-10-CM | POA: Diagnosis not present

## 2018-12-28 DIAGNOSIS — Z7982 Long term (current) use of aspirin: Secondary | ICD-10-CM | POA: Diagnosis not present

## 2018-12-28 DIAGNOSIS — E119 Type 2 diabetes mellitus without complications: Secondary | ICD-10-CM | POA: Diagnosis not present

## 2018-12-28 DIAGNOSIS — Z800 Family history of malignant neoplasm of digestive organs: Secondary | ICD-10-CM | POA: Diagnosis not present

## 2018-12-28 DIAGNOSIS — Z8542 Personal history of malignant neoplasm of other parts of uterus: Secondary | ICD-10-CM | POA: Diagnosis not present

## 2018-12-28 DIAGNOSIS — K635 Polyp of colon: Secondary | ICD-10-CM | POA: Diagnosis not present

## 2018-12-28 DIAGNOSIS — Z90710 Acquired absence of both cervix and uterus: Secondary | ICD-10-CM | POA: Diagnosis not present

## 2019-01-03 DIAGNOSIS — Z79899 Other long term (current) drug therapy: Secondary | ICD-10-CM | POA: Diagnosis not present

## 2019-01-03 DIAGNOSIS — M533 Sacrococcygeal disorders, not elsewhere classified: Secondary | ICD-10-CM | POA: Diagnosis not present

## 2019-01-03 DIAGNOSIS — M961 Postlaminectomy syndrome, not elsewhere classified: Secondary | ICD-10-CM | POA: Diagnosis not present

## 2019-01-03 DIAGNOSIS — M1990 Unspecified osteoarthritis, unspecified site: Secondary | ICD-10-CM | POA: Diagnosis not present

## 2019-01-03 DIAGNOSIS — M47814 Spondylosis without myelopathy or radiculopathy, thoracic region: Secondary | ICD-10-CM | POA: Diagnosis not present

## 2019-01-10 DIAGNOSIS — Z6834 Body mass index (BMI) 34.0-34.9, adult: Secondary | ICD-10-CM | POA: Diagnosis not present

## 2019-01-10 DIAGNOSIS — M25561 Pain in right knee: Secondary | ICD-10-CM | POA: Diagnosis not present

## 2019-01-10 DIAGNOSIS — E039 Hypothyroidism, unspecified: Secondary | ICD-10-CM | POA: Diagnosis not present

## 2019-01-10 DIAGNOSIS — Z79899 Other long term (current) drug therapy: Secondary | ICD-10-CM | POA: Diagnosis not present

## 2019-01-25 DIAGNOSIS — J309 Allergic rhinitis, unspecified: Secondary | ICD-10-CM | POA: Diagnosis not present

## 2019-01-25 DIAGNOSIS — Z1231 Encounter for screening mammogram for malignant neoplasm of breast: Secondary | ICD-10-CM | POA: Diagnosis not present

## 2019-01-25 DIAGNOSIS — I10 Essential (primary) hypertension: Secondary | ICD-10-CM | POA: Diagnosis not present

## 2019-01-25 DIAGNOSIS — F329 Major depressive disorder, single episode, unspecified: Secondary | ICD-10-CM | POA: Diagnosis not present

## 2019-02-06 DIAGNOSIS — I10 Essential (primary) hypertension: Secondary | ICD-10-CM | POA: Diagnosis not present

## 2019-02-06 DIAGNOSIS — F419 Anxiety disorder, unspecified: Secondary | ICD-10-CM | POA: Diagnosis not present

## 2019-02-06 DIAGNOSIS — G4700 Insomnia, unspecified: Secondary | ICD-10-CM | POA: Diagnosis not present

## 2019-02-06 DIAGNOSIS — F329 Major depressive disorder, single episode, unspecified: Secondary | ICD-10-CM | POA: Diagnosis not present

## 2019-02-21 ENCOUNTER — Other Ambulatory Visit: Payer: Self-pay | Admitting: Cardiology

## 2019-02-21 DIAGNOSIS — K5900 Constipation, unspecified: Secondary | ICD-10-CM | POA: Diagnosis not present

## 2019-02-21 DIAGNOSIS — R1310 Dysphagia, unspecified: Secondary | ICD-10-CM | POA: Diagnosis not present

## 2019-02-21 DIAGNOSIS — I10 Essential (primary) hypertension: Secondary | ICD-10-CM

## 2019-02-21 DIAGNOSIS — Z01810 Encounter for preprocedural cardiovascular examination: Secondary | ICD-10-CM

## 2019-02-24 DIAGNOSIS — Z23 Encounter for immunization: Secondary | ICD-10-CM | POA: Diagnosis not present

## 2019-02-26 DIAGNOSIS — M961 Postlaminectomy syndrome, not elsewhere classified: Secondary | ICD-10-CM | POA: Diagnosis not present

## 2019-02-26 DIAGNOSIS — M533 Sacrococcygeal disorders, not elsewhere classified: Secondary | ICD-10-CM | POA: Diagnosis not present

## 2019-02-26 DIAGNOSIS — M546 Pain in thoracic spine: Secondary | ICD-10-CM | POA: Diagnosis not present

## 2019-03-19 DIAGNOSIS — M1610 Unilateral primary osteoarthritis, unspecified hip: Secondary | ICD-10-CM | POA: Diagnosis not present

## 2019-04-25 DIAGNOSIS — Z6834 Body mass index (BMI) 34.0-34.9, adult: Secondary | ICD-10-CM | POA: Diagnosis not present

## 2019-04-25 DIAGNOSIS — E559 Vitamin D deficiency, unspecified: Secondary | ICD-10-CM | POA: Diagnosis not present

## 2019-04-25 DIAGNOSIS — Z09 Encounter for follow-up examination after completed treatment for conditions other than malignant neoplasm: Secondary | ICD-10-CM | POA: Diagnosis not present

## 2019-04-25 DIAGNOSIS — E039 Hypothyroidism, unspecified: Secondary | ICD-10-CM | POA: Diagnosis not present

## 2019-04-25 DIAGNOSIS — D473 Essential (hemorrhagic) thrombocythemia: Secondary | ICD-10-CM | POA: Diagnosis not present

## 2019-04-25 DIAGNOSIS — E785 Hyperlipidemia, unspecified: Secondary | ICD-10-CM | POA: Diagnosis not present

## 2019-04-25 DIAGNOSIS — Z79899 Other long term (current) drug therapy: Secondary | ICD-10-CM | POA: Diagnosis not present

## 2019-04-25 DIAGNOSIS — M25561 Pain in right knee: Secondary | ICD-10-CM | POA: Diagnosis not present

## 2019-04-25 DIAGNOSIS — R600 Localized edema: Secondary | ICD-10-CM | POA: Diagnosis not present

## 2019-04-25 DIAGNOSIS — Z131 Encounter for screening for diabetes mellitus: Secondary | ICD-10-CM | POA: Diagnosis not present

## 2019-05-04 DIAGNOSIS — M25461 Effusion, right knee: Secondary | ICD-10-CM | POA: Diagnosis not present

## 2019-05-04 DIAGNOSIS — M25561 Pain in right knee: Secondary | ICD-10-CM | POA: Diagnosis not present

## 2019-05-07 IMAGING — NM NM MISC PROCEDURE
3 series · 18 of 18 positions shown · non-contrast
Comparison: none

[Series 1: stress-gsp_(id)_sa · 6.4mm · 6.40mm/px · 6 of 512 frames shown]
[frame 43/512]
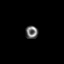
[frame 128/512]
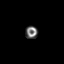
[frame 214/512]
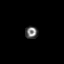
[frame 299/512]
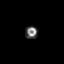
[frame 384/512]
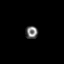
[frame 470/512]
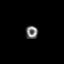

[Series 1: stress-sum-em_(id)_sa · 6.4mm · 6.40mm/px · 6 of 64 frames shown]
[frame 6/64]
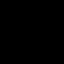
[frame 16/64]
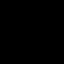
[frame 27/64]
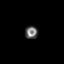
[frame 38/64]
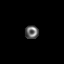
[frame 48/64]
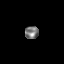
[frame 59/64]
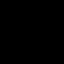

[Series 1: rest_(id)_sa · 6.4mm · 6.40mm/px · 6 of 64 frames shown]
[frame 6/64]
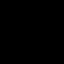
[frame 16/64]
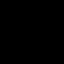
[frame 27/64]
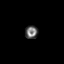
[frame 38/64]
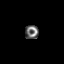
[frame 48/64]
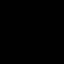
[frame 59/64]
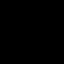

[18 of 18 positions shown; findings below may reference images not displayed]

Canned report from images found in remote index.

Refer to host system for actual result text.

## 2019-05-15 DIAGNOSIS — K2970 Gastritis, unspecified, without bleeding: Secondary | ICD-10-CM | POA: Diagnosis not present

## 2019-05-15 DIAGNOSIS — R197 Diarrhea, unspecified: Secondary | ICD-10-CM | POA: Diagnosis not present

## 2019-05-15 DIAGNOSIS — I10 Essential (primary) hypertension: Secondary | ICD-10-CM | POA: Diagnosis not present

## 2019-05-15 DIAGNOSIS — E871 Hypo-osmolality and hyponatremia: Secondary | ICD-10-CM | POA: Diagnosis not present

## 2019-05-16 DIAGNOSIS — K2970 Gastritis, unspecified, without bleeding: Secondary | ICD-10-CM | POA: Diagnosis not present

## 2019-05-16 DIAGNOSIS — R197 Diarrhea, unspecified: Secondary | ICD-10-CM | POA: Diagnosis not present

## 2019-05-18 DIAGNOSIS — M818 Other osteoporosis without current pathological fracture: Secondary | ICD-10-CM | POA: Diagnosis not present

## 2019-05-18 DIAGNOSIS — Z780 Asymptomatic menopausal state: Secondary | ICD-10-CM | POA: Diagnosis not present

## 2019-05-21 ENCOUNTER — Ambulatory Visit (INDEPENDENT_AMBULATORY_CARE_PROVIDER_SITE_OTHER): Payer: PPO | Admitting: Cardiology

## 2019-05-21 ENCOUNTER — Other Ambulatory Visit: Payer: Self-pay

## 2019-05-21 ENCOUNTER — Encounter: Payer: Self-pay | Admitting: Cardiology

## 2019-05-21 VITALS — BP 128/78 | HR 104 | Ht 67.0 in | Wt 208.0 lb

## 2019-05-21 DIAGNOSIS — I351 Nonrheumatic aortic (valve) insufficiency: Secondary | ICD-10-CM

## 2019-05-21 DIAGNOSIS — I10 Essential (primary) hypertension: Secondary | ICD-10-CM

## 2019-05-21 HISTORY — DX: Nonrheumatic aortic (valve) insufficiency: I35.1

## 2019-05-21 NOTE — Patient Instructions (Signed)
Medication Instructions:  No medication changes *If you need a refill on your cardiac medications before your next appointment, please call your pharmacy*  Lab Work: None ordered If you have labs (blood work) drawn today and your tests are completely normal, you will receive your results only by: Marland Kitchen MyChart Message (if you have MyChart) OR . A paper copy in the mail If you have any lab test that is abnormal or we need to change your treatment, we will call you to review the results.  Testing/Procedures: Your physician has requested that you have an echocardiogram. Echocardiography is a painless test that uses sound waves to create images of your heart. It provides your doctor with information about the size and shape of your heart and how well your heart's chambers and valves are working. This procedure takes approximately one hour. There are no restrictions for this procedure.    Follow-Up: At [facility], you and your health needs are our priority.  As part of our continuing mission to provide you with exceptional heart care, we have created designated Provider Care Teams.  These Care Teams include your primary Cardiologist (physician) and Advanced Practice Providers (APPs -  Physician Assistants and Nurse Practitioners) who all work together to provide you with the care you need, when you need it.  Your next appointment:   6 month(s)  The format for your next appointment:   In Person  Provider:   Jyl Heinz, MD  Other Instructions  Echocardiogram An echocardiogram is a procedure that uses painless sound waves (ultrasound) to produce an image of the heart. Images from an echocardiogram can provide important information about:  Signs of coronary artery disease (CAD).  Aneurysm detection. An aneurysm is a weak or damaged part of an artery wall that bulges out from the normal force of blood pumping through the body.  Heart size and shape. Changes in the size or shape of the  heart can be associated with certain conditions, including heart failure, aneurysm, and CAD.  Heart muscle function.  Heart valve function.  Signs of a past heart attack.  Fluid buildup around the heart.  Thickening of the heart muscle.  A tumor or infectious growth around the heart valves. Tell a health care provider about:  Any allergies you have.  All medicines you are taking, including vitamins, herbs, eye drops, creams, and over-the-counter medicines.  Any blood disorders you have.  Any surgeries you have had.  Any medical conditions you have.  Whether you are pregnant or may be pregnant. What are the risks? Generally, this is a safe procedure. However, problems may occur, including:  Allergic reaction to dye (contrast) that may be used during the procedure. What happens before the procedure? No specific preparation is needed. You may eat and drink normally. What happens during the procedure?   An IV tube may be inserted into one of your veins.  You may receive contrast through this tube. A contrast is an injection that improves the quality of the pictures from your heart.  A gel will be applied to your chest.  A wand-like tool (transducer) will be moved over your chest. The gel will help to transmit the sound waves from the transducer.  The sound waves will harmlessly bounce off of your heart to allow the heart images to be captured in real-time motion. The images will be recorded on a computer. The procedure may vary among health care providers and hospitals. What happens after the procedure?  You may return  to your normal, everyday life, including diet, activities, and medicines, unless your health care provider tells you not to do that. Summary  An echocardiogram is a procedure that uses painless sound waves (ultrasound) to produce an image of the heart.  Images from an echocardiogram can provide important information about the size and shape of your  heart, heart muscle function, heart valve function, and fluid buildup around your heart.  You do not need to do anything to prepare before this procedure. You may eat and drink normally.  After the echocardiogram is completed, you may return to your normal, everyday life, unless your health care provider tells you not to do that. This information is not intended to replace advice given to you by your health care provider. Make sure you discuss any questions you have with your health care provider. Document Revised: 07/27/2018 Document Reviewed: 05/08/2016 Elsevier Patient Education  [place].

## 2019-05-21 NOTE — Progress Notes (Signed)
Cardiology Office Note:    Date:  05/21/2019   ID:  Lauren Roth, DOB [birth year], MRN [id number]  PCP:  Lauren Johns, MD  Cardiologist:  Lauren Lindau, MD   Referring MD: Lauren Johns, MD    ASSESSMENT:    1. Essential hypertension   2. Moderate aortic regurgitation    PLAN:    In order of problems listed above:  1. Primary prevention stressed with the patient.  Importance of compliance with diet and medication stressed and she vocalized understanding.  Her blood pressure is stable. 2. Essential hypertension: Blood pressure is stable.  Diet was discussed weight reduction was stressed.  Salt intake issues were discussed. 3. Mild to moderate aortic regurgitation: Stable and asymptomatic at this time though it needs to be noted that the patient is very sedentary.  Echocardiogram will be done to follow-up on this diagnosis.  I discussed this with her and she is okay with it. 4. Patient will be seen in follow-up appointment in 6 months or earlier if the patient has any concerns    Medication Adjustments/Labs and Tests Ordered: Current medicines are reviewed at length with the patient today.  Concerns regarding medicines are outlined above.  No orders of the defined types were placed in this encounter.  No orders of the defined types were placed in this encounter.    No chief complaint on file.    History of Present Illness:    Lauren Roth is a 74 y.o. female.  Patient has past medical history of essential hypertension and has mild to moderate regurgitation by echocardiogram in the past.  She is overweight and leads a sedentary lifestyle.  She has orthopedic issues involving her vertebral column and her knees.  She denies any chest pain orthopnea or PND.  She mentions to me that in the past several months she has gained about 20 pounds of weight.  At the time of my evaluation, the patient is alert awake oriented and in no distress.  History reviewed. No  pertinent past medical history.  Past Surgical History:  Procedure Laterality Date  . APPENDECTOMY    . CERVICAL DISC SURGERY    . CHOLECYSTECTOMY    . FRACTURE SURGERY Left    Left arm has a metal plate    Current Medications: Current Meds  Medication Sig  . ARIPiprazole (ABILIFY) 5 MG tablet Take 1 tablet by mouth daily.  Marland Kitchen aspirin EC 81 MG tablet Take 81 mg by mouth daily.  Marland Kitchen buPROPion (WELLBUTRIN XL) 300 MG 24 hr tablet Take 1 tablet by mouth daily.  . cloNIDine (CATAPRES) 0.1 MG tablet Take 1 tablet by mouth 2 (two) times daily.  . diclofenac sodium (VOLTAREN) 1 % GEL Apply 1 % topically as needed.  . donepezil (ARICEPT) 10 MG tablet Take 10 mg by mouth daily.  . DULoxetine (CYMBALTA) 60 MG capsule Take 60 mg by mouth daily.  Marland Kitchen estrogens, conjugated, (PREMARIN) 0.625 MG tablet Take 1 tablet by mouth daily.  . furosemide (LASIX) 20 MG tablet Take 1 tablet by mouth daily.  Marland Kitchen levothyroxine (SYNTHROID, LEVOTHROID) 25 MCG tablet Take 1 tablet by mouth daily.  Marland Kitchen lisinopril-hydrochlorothiazide (PRINZIDE,ZESTORETIC) 20-12.5 MG tablet Take 1 tablet by mouth daily.  Marland Kitchen loratadine (CLARITIN) 10 MG tablet Take 10 mg by mouth daily.  Marland Kitchen morphine (MS CONTIN) 30 MG 12 hr tablet Take 30 mg by mouth every 8 (eight) hours as needed.  . nitroGLYCERIN (NITROSTAT) 0.4 MG SL tablet Place 0.4 mg  under the tongue as needed.  Marland Kitchen omeprazole (PRILOSEC) 20 MG capsule Take 1 capsule by mouth daily.  . Oxycodone HCl 10 MG TABS Take 10 mg by mouth 4 (four) times daily as needed.  . potassium chloride (K-DUR) 10 MEQ tablet Take 1 tablet by mouth daily.  Marland Kitchen rOPINIRole (REQUIP) 1 MG tablet Take 1 tablet by mouth daily.  Marland Kitchen tiZANidine (ZANAFLEX) 2 MG tablet Take 2 mg by mouth every 8 (eight) hours as needed.     Allergies:   Cottonseed oil and Codeine sulfate   Social History   Socioeconomic History  . Marital status: Married    Spouse name: Not on file  . Number of children: Not on file  . Years of  education: Not on file  . Highest education level: Not on file  Occupational History  . Not on file  Tobacco Use  . Smoking status: Former Research scientist (life sciences)  . Smokeless tobacco: Never Used  . Tobacco comment: at age 17  Substance and Sexual Activity  . Alcohol use: No  . Drug use: No  . Sexual activity: Not on file  Other Topics Concern  . Not on file  Social History Narrative  . Not on file   Social Determinants of Health   Financial Resource Strain:   . Difficulty of Paying Living Expenses: Not on file  Food Insecurity:   . Worried About Charity fundraiser in the Last Year: Not on file  . Ran Out of Food in the Last Year: Not on file  Transportation Needs:   . Lack of Transportation (Medical): Not on file  . Lack of Transportation (Non-Medical): Not on file  Physical Activity:   . Days of Exercise per Week: Not on file  . Minutes of Exercise per Session: Not on file  Stress:   . Feeling of Stress : Not on file  Social Connections:   . Frequency of Communication with Friends and Family: Not on file  . Frequency of Social Gatherings with Friends and Family: Not on file  . Attends Religious Services: Not on file  . Active Member of Clubs or Organizations: Not on file  . Attends Archivist Meetings: Not on file  . Marital Status: Not on file     Family History: The patient's family history includes Deep vein thrombosis in her father; Heart disease in her brother, brother, and father; Heart failure in her father; Pulmonary embolism in her brother.  ROS:   Please see the history of present illness.    All other systems reviewed and are negative.  EKGs/Labs/Other Studies Reviewed:    The following studies were reviewed today: - Left ventricle: The cavity size was normal. Systolic function was  normal. The estimated ejection fraction was in the range of 60%  to 65%. Wall motion was normal; there were no regional wall  motion abnormalities. Left ventricular  diastolic function  parameters were normal.  - Aortic valve: Trileaflet; mildly thickened, mildly calcified  leaflets. There was mild to moderate regurgitation. Regurgitation  pressure half-time: 407 ms.  - Mitral valve: There was trivial regurgitation.  - Tricuspid valve: There was mild regurgitation.  - Pulmonary arteries: PA peak pressure: 37 mm Hg (S).   Impressions:   - The right ventricular systolic pressure was increased consistent  with mild pulmonary hypertension.   EKG reveals sinus rhythm and nonspecific ST-T changes   Recent Labs: No results found for requested labs within last 8760 hours.  Recent Lipid Panel No  results found for: CHOL, TRIG, HDL, CHOLHDL, VLDL, LDLCALC, LDLDIRECT  Physical Exam:    VS:  BP 128/78   Pulse (!) 104   Ht 5\' 7"  (1.702 m)   Wt 208 lb (94.3 kg)   SpO2 95%   BMI 32.58 kg/m     Wt Readings from Last 3 Encounters:  05/21/19 208 lb (94.3 kg)  11/25/17 200 lb (90.7 kg)  02/21/17 199 lb (90.3 kg)     GEN: Patient is in no acute distress HEENT: Normal NECK: No JVD; No carotid bruits LYMPHATICS: No lymphadenopathy CARDIAC: Hear sounds regular, 2/6 systolic murmur at the apex. RESPIRATORY:  Clear to auscultation without rales, wheezing or rhonchi  ABDOMEN: Soft, non-tender, non-distended MUSCULOSKELETAL:  No edema; No deformity  SKIN: Warm and dry NEUROLOGIC:  Alert and oriented x 3 PSYCHIATRIC:  Normal affect   Signed, Lauren Lindau, MD  05/21/2019 4:12 PM    [facility]

## 2019-05-23 DIAGNOSIS — R109 Unspecified abdominal pain: Secondary | ICD-10-CM | POA: Diagnosis not present

## 2019-05-24 DIAGNOSIS — M546 Pain in thoracic spine: Secondary | ICD-10-CM | POA: Diagnosis not present

## 2019-05-24 DIAGNOSIS — M961 Postlaminectomy syndrome, not elsewhere classified: Secondary | ICD-10-CM | POA: Diagnosis not present

## 2019-05-24 DIAGNOSIS — M533 Sacrococcygeal disorders, not elsewhere classified: Secondary | ICD-10-CM | POA: Diagnosis not present

## 2019-05-24 DIAGNOSIS — M5137 Other intervertebral disc degeneration, lumbosacral region: Secondary | ICD-10-CM | POA: Diagnosis not present

## 2019-05-24 DIAGNOSIS — M25552 Pain in left hip: Secondary | ICD-10-CM | POA: Insufficient documentation

## 2019-05-24 HISTORY — DX: Pain in left hip: M25.552

## 2019-06-12 DIAGNOSIS — M25552 Pain in left hip: Secondary | ICD-10-CM | POA: Diagnosis not present

## 2019-06-12 DIAGNOSIS — M792 Neuralgia and neuritis, unspecified: Secondary | ICD-10-CM | POA: Diagnosis not present

## 2019-06-13 DIAGNOSIS — K3184 Gastroparesis: Secondary | ICD-10-CM | POA: Diagnosis not present

## 2019-06-13 DIAGNOSIS — K5900 Constipation, unspecified: Secondary | ICD-10-CM | POA: Diagnosis not present

## 2019-06-13 DIAGNOSIS — K219 Gastro-esophageal reflux disease without esophagitis: Secondary | ICD-10-CM | POA: Diagnosis not present

## 2019-06-13 DIAGNOSIS — R1310 Dysphagia, unspecified: Secondary | ICD-10-CM | POA: Diagnosis not present

## 2019-06-21 DIAGNOSIS — G8929 Other chronic pain: Secondary | ICD-10-CM | POA: Diagnosis not present

## 2019-06-21 DIAGNOSIS — M792 Neuralgia and neuritis, unspecified: Secondary | ICD-10-CM | POA: Diagnosis not present

## 2019-06-21 DIAGNOSIS — M25552 Pain in left hip: Secondary | ICD-10-CM | POA: Diagnosis not present

## 2019-06-26 DIAGNOSIS — Z1331 Encounter for screening for depression: Secondary | ICD-10-CM | POA: Diagnosis not present

## 2019-06-26 DIAGNOSIS — E785 Hyperlipidemia, unspecified: Secondary | ICD-10-CM | POA: Diagnosis not present

## 2019-06-26 DIAGNOSIS — Z9181 History of falling: Secondary | ICD-10-CM | POA: Diagnosis not present

## 2019-06-26 DIAGNOSIS — Z0000 Encounter for general adult medical examination without abnormal findings: Secondary | ICD-10-CM | POA: Diagnosis not present

## 2019-06-26 DIAGNOSIS — E669 Obesity, unspecified: Secondary | ICD-10-CM | POA: Diagnosis not present

## 2019-06-26 DIAGNOSIS — Z139 Encounter for screening, unspecified: Secondary | ICD-10-CM | POA: Diagnosis not present

## 2019-07-02 DIAGNOSIS — G4700 Insomnia, unspecified: Secondary | ICD-10-CM | POA: Diagnosis not present

## 2019-07-02 DIAGNOSIS — R251 Tremor, unspecified: Secondary | ICD-10-CM | POA: Diagnosis not present

## 2019-07-02 DIAGNOSIS — F329 Major depressive disorder, single episode, unspecified: Secondary | ICD-10-CM | POA: Diagnosis not present

## 2019-07-02 DIAGNOSIS — F419 Anxiety disorder, unspecified: Secondary | ICD-10-CM | POA: Diagnosis not present

## 2019-07-05 ENCOUNTER — Other Ambulatory Visit: Payer: PPO

## 2019-07-05 DIAGNOSIS — M25552 Pain in left hip: Secondary | ICD-10-CM | POA: Diagnosis not present

## 2019-07-05 DIAGNOSIS — M792 Neuralgia and neuritis, unspecified: Secondary | ICD-10-CM | POA: Diagnosis not present

## 2019-07-19 DIAGNOSIS — M797 Fibromyalgia: Secondary | ICD-10-CM | POA: Diagnosis not present

## 2019-07-19 DIAGNOSIS — M4727 Other spondylosis with radiculopathy, lumbosacral region: Secondary | ICD-10-CM | POA: Diagnosis not present

## 2019-07-19 DIAGNOSIS — M961 Postlaminectomy syndrome, not elsewhere classified: Secondary | ICD-10-CM | POA: Diagnosis not present

## 2019-07-19 DIAGNOSIS — M47814 Spondylosis without myelopathy or radiculopathy, thoracic region: Secondary | ICD-10-CM | POA: Diagnosis not present

## 2019-07-19 DIAGNOSIS — M533 Sacrococcygeal disorders, not elsewhere classified: Secondary | ICD-10-CM | POA: Diagnosis not present

## 2019-07-23 DIAGNOSIS — G4700 Insomnia, unspecified: Secondary | ICD-10-CM | POA: Diagnosis not present

## 2019-07-23 DIAGNOSIS — G737 Myopathy in diseases classified elsewhere: Secondary | ICD-10-CM | POA: Diagnosis not present

## 2019-07-23 DIAGNOSIS — M159 Polyosteoarthritis, unspecified: Secondary | ICD-10-CM | POA: Diagnosis not present

## 2019-07-23 DIAGNOSIS — J309 Allergic rhinitis, unspecified: Secondary | ICD-10-CM | POA: Diagnosis not present

## 2019-07-23 DIAGNOSIS — D473 Essential (hemorrhagic) thrombocythemia: Secondary | ICD-10-CM | POA: Diagnosis not present

## 2019-07-23 DIAGNOSIS — E039 Hypothyroidism, unspecified: Secondary | ICD-10-CM | POA: Diagnosis not present

## 2019-07-23 DIAGNOSIS — I10 Essential (primary) hypertension: Secondary | ICD-10-CM | POA: Diagnosis not present

## 2019-07-23 DIAGNOSIS — Z79899 Other long term (current) drug therapy: Secondary | ICD-10-CM | POA: Diagnosis not present

## 2019-07-23 DIAGNOSIS — G8929 Other chronic pain: Secondary | ICD-10-CM | POA: Diagnosis not present

## 2019-07-24 DIAGNOSIS — D473 Essential (hemorrhagic) thrombocythemia: Secondary | ICD-10-CM | POA: Diagnosis not present

## 2019-07-24 DIAGNOSIS — Z79899 Other long term (current) drug therapy: Secondary | ICD-10-CM | POA: Diagnosis not present

## 2019-07-24 DIAGNOSIS — E039 Hypothyroidism, unspecified: Secondary | ICD-10-CM | POA: Diagnosis not present

## 2019-07-27 DIAGNOSIS — F411 Generalized anxiety disorder: Secondary | ICD-10-CM | POA: Diagnosis not present

## 2019-08-10 ENCOUNTER — Ambulatory Visit: Payer: PPO

## 2019-08-10 ENCOUNTER — Other Ambulatory Visit: Payer: Self-pay

## 2019-08-10 DIAGNOSIS — I10 Essential (primary) hypertension: Secondary | ICD-10-CM

## 2019-08-10 DIAGNOSIS — I351 Nonrheumatic aortic (valve) insufficiency: Secondary | ICD-10-CM | POA: Diagnosis not present

## 2019-08-10 NOTE — Progress Notes (Signed)
Complete echocardiogram has been performed.  Jimmy Joletta Manner RDCS, RVT 

## 2019-08-17 DIAGNOSIS — F329 Major depressive disorder, single episode, unspecified: Secondary | ICD-10-CM | POA: Diagnosis not present

## 2019-08-17 DIAGNOSIS — R251 Tremor, unspecified: Secondary | ICD-10-CM | POA: Diagnosis not present

## 2019-08-17 DIAGNOSIS — F419 Anxiety disorder, unspecified: Secondary | ICD-10-CM | POA: Diagnosis not present

## 2019-08-17 DIAGNOSIS — G4700 Insomnia, unspecified: Secondary | ICD-10-CM | POA: Diagnosis not present

## 2019-08-31 DIAGNOSIS — F329 Major depressive disorder, single episode, unspecified: Secondary | ICD-10-CM | POA: Diagnosis not present

## 2019-08-31 DIAGNOSIS — M549 Dorsalgia, unspecified: Secondary | ICD-10-CM | POA: Diagnosis not present

## 2019-08-31 DIAGNOSIS — G4700 Insomnia, unspecified: Secondary | ICD-10-CM | POA: Diagnosis not present

## 2019-08-31 DIAGNOSIS — F419 Anxiety disorder, unspecified: Secondary | ICD-10-CM | POA: Diagnosis not present

## 2019-09-27 DIAGNOSIS — M5414 Radiculopathy, thoracic region: Secondary | ICD-10-CM | POA: Diagnosis not present

## 2019-09-27 DIAGNOSIS — M961 Postlaminectomy syndrome, not elsewhere classified: Secondary | ICD-10-CM | POA: Diagnosis not present

## 2019-09-27 DIAGNOSIS — Z5181 Encounter for therapeutic drug level monitoring: Secondary | ICD-10-CM | POA: Diagnosis not present

## 2019-09-27 DIAGNOSIS — M533 Sacrococcygeal disorders, not elsewhere classified: Secondary | ICD-10-CM | POA: Diagnosis not present

## 2019-09-30 DIAGNOSIS — Z23 Encounter for immunization: Secondary | ICD-10-CM | POA: Diagnosis not present

## 2019-09-30 DIAGNOSIS — S61012A Laceration without foreign body of left thumb without damage to nail, initial encounter: Secondary | ICD-10-CM | POA: Diagnosis not present

## 2019-09-30 DIAGNOSIS — S61201A Unspecified open wound of left index finger without damage to nail, initial encounter: Secondary | ICD-10-CM | POA: Diagnosis not present

## 2019-09-30 DIAGNOSIS — S61209A Unspecified open wound of unspecified finger without damage to nail, initial encounter: Secondary | ICD-10-CM | POA: Diagnosis not present

## 2019-10-01 DIAGNOSIS — G4700 Insomnia, unspecified: Secondary | ICD-10-CM | POA: Diagnosis not present

## 2019-10-01 DIAGNOSIS — F329 Major depressive disorder, single episode, unspecified: Secondary | ICD-10-CM | POA: Diagnosis not present

## 2019-10-01 DIAGNOSIS — F419 Anxiety disorder, unspecified: Secondary | ICD-10-CM | POA: Diagnosis not present

## 2019-10-29 DIAGNOSIS — D473 Essential (hemorrhagic) thrombocythemia: Secondary | ICD-10-CM | POA: Diagnosis not present

## 2019-11-13 DIAGNOSIS — I10 Essential (primary) hypertension: Secondary | ICD-10-CM | POA: Diagnosis not present

## 2019-11-13 DIAGNOSIS — G8929 Other chronic pain: Secondary | ICD-10-CM | POA: Diagnosis not present

## 2019-11-13 DIAGNOSIS — G737 Myopathy in diseases classified elsewhere: Secondary | ICD-10-CM | POA: Diagnosis not present

## 2019-11-13 DIAGNOSIS — G4700 Insomnia, unspecified: Secondary | ICD-10-CM | POA: Diagnosis not present

## 2019-11-13 DIAGNOSIS — Z79899 Other long term (current) drug therapy: Secondary | ICD-10-CM | POA: Diagnosis not present

## 2019-11-13 DIAGNOSIS — J309 Allergic rhinitis, unspecified: Secondary | ICD-10-CM | POA: Diagnosis not present

## 2019-11-13 DIAGNOSIS — E785 Hyperlipidemia, unspecified: Secondary | ICD-10-CM | POA: Diagnosis not present

## 2019-11-13 DIAGNOSIS — E039 Hypothyroidism, unspecified: Secondary | ICD-10-CM | POA: Diagnosis not present

## 2019-11-13 DIAGNOSIS — M159 Polyosteoarthritis, unspecified: Secondary | ICD-10-CM | POA: Diagnosis not present

## 2019-11-13 DIAGNOSIS — Z6834 Body mass index (BMI) 34.0-34.9, adult: Secondary | ICD-10-CM | POA: Diagnosis not present

## 2019-11-13 DIAGNOSIS — D473 Essential (hemorrhagic) thrombocythemia: Secondary | ICD-10-CM | POA: Diagnosis not present

## 2019-11-13 DIAGNOSIS — E559 Vitamin D deficiency, unspecified: Secondary | ICD-10-CM | POA: Diagnosis not present

## 2019-12-03 DIAGNOSIS — G4700 Insomnia, unspecified: Secondary | ICD-10-CM | POA: Diagnosis not present

## 2019-12-03 DIAGNOSIS — F419 Anxiety disorder, unspecified: Secondary | ICD-10-CM | POA: Diagnosis not present

## 2019-12-03 DIAGNOSIS — F329 Major depressive disorder, single episode, unspecified: Secondary | ICD-10-CM | POA: Diagnosis not present

## 2019-12-11 DIAGNOSIS — Z20828 Contact with and (suspected) exposure to other viral communicable diseases: Secondary | ICD-10-CM | POA: Diagnosis not present

## 2019-12-11 DIAGNOSIS — M7989 Other specified soft tissue disorders: Secondary | ICD-10-CM | POA: Diagnosis not present

## 2019-12-11 DIAGNOSIS — Z8616 Personal history of COVID-19: Secondary | ICD-10-CM | POA: Diagnosis not present

## 2019-12-11 DIAGNOSIS — I739 Peripheral vascular disease, unspecified: Secondary | ICD-10-CM | POA: Diagnosis not present

## 2019-12-11 DIAGNOSIS — J3489 Other specified disorders of nose and nasal sinuses: Secondary | ICD-10-CM | POA: Diagnosis not present

## 2019-12-11 DIAGNOSIS — I10 Essential (primary) hypertension: Secondary | ICD-10-CM | POA: Diagnosis not present

## 2019-12-13 ENCOUNTER — Ambulatory Visit: Payer: PPO | Admitting: Podiatry

## 2019-12-26 DIAGNOSIS — M797 Fibromyalgia: Secondary | ICD-10-CM | POA: Diagnosis not present

## 2019-12-26 DIAGNOSIS — M533 Sacrococcygeal disorders, not elsewhere classified: Secondary | ICD-10-CM | POA: Diagnosis not present

## 2019-12-26 DIAGNOSIS — M961 Postlaminectomy syndrome, not elsewhere classified: Secondary | ICD-10-CM | POA: Diagnosis not present

## 2019-12-26 DIAGNOSIS — M546 Pain in thoracic spine: Secondary | ICD-10-CM | POA: Diagnosis not present

## 2020-01-03 ENCOUNTER — Ambulatory Visit: Payer: PPO | Admitting: Podiatry

## 2020-01-03 ENCOUNTER — Encounter: Payer: Self-pay | Admitting: Podiatry

## 2020-01-03 ENCOUNTER — Other Ambulatory Visit: Payer: Self-pay

## 2020-01-03 DIAGNOSIS — M792 Neuralgia and neuritis, unspecified: Secondary | ICD-10-CM

## 2020-01-03 DIAGNOSIS — G629 Polyneuropathy, unspecified: Secondary | ICD-10-CM | POA: Diagnosis not present

## 2020-01-18 ENCOUNTER — Other Ambulatory Visit: Payer: Self-pay | Admitting: *Deleted

## 2020-01-18 DIAGNOSIS — I739 Peripheral vascular disease, unspecified: Secondary | ICD-10-CM

## 2020-01-18 NOTE — Progress Notes (Signed)
  Subjective:  Patient ID: Lauren Roth, female    DOB: 08/05/1945,  MRN: [id number]  Chief Complaint  Patient presents with  . Foot Problem    both of my feet are touchy and feel like an electrically shock     74 y.o. female presents with the above complaint. History confirmed with patient.  States she has nerve damage in both feet for several years denies diabetes.  States that the feet burning throbbing or sore and tender but numbing tingling.  Objective:  Physical Exam: warm, good capillary refill, no trophic changes or ulcerative lesions, normal DP and PT pulses and diminished sensation to light touch all nerve distributions bilaterally  left Foot: normal exam, no swelling, tenderness, instability; ligaments intact, full range of motion of all ankle/foot joints  Right Foot: normal exam, no swelling, tenderness, instability; ligaments intact, full range of motion of all ankle/foot joints   Assessment:   1. Polyneuropathy   2. Neuralgia      Plan:  Patient was evaluated and treated and all questions answered.  Polyneuropathy -Educated on possible etiology discussed with patient that I do believe this is coming from her back and there is nothing that we can do from the pedal perspective.  Discussed that this point she would benefit from symptomatic relief and getting the etiology of the the issue.  Recommend patient see a back specialist

## 2020-01-21 DIAGNOSIS — M65861 Other synovitis and tenosynovitis, right lower leg: Secondary | ICD-10-CM | POA: Diagnosis not present

## 2020-01-21 DIAGNOSIS — R799 Abnormal finding of blood chemistry, unspecified: Secondary | ICD-10-CM | POA: Diagnosis not present

## 2020-01-21 DIAGNOSIS — M1711 Unilateral primary osteoarthritis, right knee: Secondary | ICD-10-CM | POA: Diagnosis not present

## 2020-02-08 ENCOUNTER — Ambulatory Visit (INDEPENDENT_AMBULATORY_CARE_PROVIDER_SITE_OTHER): Payer: PPO | Admitting: Vascular Surgery

## 2020-02-08 ENCOUNTER — Ambulatory Visit (HOSPITAL_COMMUNITY)
Admission: RE | Admit: 2020-02-08 | Discharge: 2020-02-08 | Disposition: A | Discharge status: Home / Self Care | Payer: PPO | Source: Ambulatory Visit | Attending: Vascular Surgery | Admitting: Vascular Surgery

## 2020-02-08 ENCOUNTER — Encounter: Payer: Self-pay | Admitting: Vascular Surgery

## 2020-02-08 ENCOUNTER — Other Ambulatory Visit: Payer: Self-pay

## 2020-02-08 VITALS — BP 106/70 | HR 67 | Temp 97.8°F | Resp 20 | Ht 67.0 in | Wt 193.0 lb

## 2020-02-08 DIAGNOSIS — M48062 Spinal stenosis, lumbar region with neurogenic claudication: Secondary | ICD-10-CM

## 2020-02-08 DIAGNOSIS — I739 Peripheral vascular disease, unspecified: Secondary | ICD-10-CM | POA: Diagnosis not present

## 2020-02-08 NOTE — Progress Notes (Signed)
Patient ID: Lauren Roth, female   DOB: 1946/03/06, 74 y.o.   MRN: [id number]  Reason for Consult: New Patient (Initial Visit)   Referred by Nicholos Johns, MD  Subjective:     HPI:  Lauren Roth is a 74 y.o. female without previous history of vascular disease.  She does have hypertension as a risk factor.  She has a previous neck fusion from an anterior approach.  She states that she does have lumbar radiculopathy causing her severe leg pain she has difficulty sleeping at night due to this.  She denies any claudication she does walk with help of a walker has severe right knee pain and is considered for right knee replacement.  She does not have tissue loss or ulceration.  She denies any history of aneurysm.  She does not have any history of stroke TIA or hematosis.  Past Medical History:  Diagnosis Date  . Arthritis   . Depression   . Fibromyalgia   . Hypertension   . Neuropathy   . Thyroid disease    Family History  Problem Relation Age of Onset  . Deep vein thrombosis Father   . Heart disease Father   . Heart failure Father   . Heart disease Brother   . Pulmonary embolism Brother   . Heart disease Brother    Past Surgical History:  Procedure Laterality Date  . APPENDECTOMY    . CERVICAL DISC SURGERY    . CHOLECYSTECTOMY    . FRACTURE SURGERY Left    Left arm has a metal plate  . KNEE SURGERY      Short Social History:  Social History   Tobacco Use  . Smoking status: Former Research scientist (life sciences)  . Smokeless tobacco: Never Used  . Tobacco comment: at age 74  Substance Use Topics  . Alcohol use: No    Allergies  Allergen Reactions  . Cottonseed Oil Hives and Itching    Raw Cotton  . Codeine Sulfate     Current Outpatient Medications  Medication Sig Dispense Refill  . amitriptyline (ELAVIL) 100 MG tablet Take 1 tablet by mouth daily.    . ARIPiprazole (ABILIFY) 5 MG tablet Take 1 tablet by mouth daily.    Marland Kitchen aspirin EC 81 MG tablet Take 81 mg by  mouth daily.    Marland Kitchen buPROPion (WELLBUTRIN XL) 300 MG 24 hr tablet Take 1 tablet by mouth daily.    . cloNIDine (CATAPRES) 0.1 MG tablet Take 1 tablet by mouth 2 (two) times daily.    . diclofenac sodium (VOLTAREN) 1 % GEL Apply 1 % topically as needed.    . donepezil (ARICEPT) 10 MG tablet Take 10 mg by mouth daily.    . DULoxetine (CYMBALTA) 60 MG capsule Take 60 mg by mouth daily.    Marland Kitchen estrogens, conjugated, (PREMARIN) 0.625 MG tablet Take 1 tablet by mouth daily.    . furosemide (LASIX) 20 MG tablet Take 1 tablet by mouth daily.    Marland Kitchen levothyroxine (SYNTHROID, LEVOTHROID) 25 MCG tablet Take 1 tablet by mouth daily.    Marland Kitchen lisinopril-hydrochlorothiazide (PRINZIDE,ZESTORETIC) 20-12.5 MG tablet Take 1 tablet by mouth daily.    Marland Kitchen loratadine (CLARITIN) 10 MG tablet Take 10 mg by mouth daily.    . metoCLOPramide (REGLAN) 10 MG tablet Take 1 tablet by mouth daily.    Marland Kitchen morphine (MS CONTIN) 30 MG 12 hr tablet Take 30 mg by mouth every 8 (eight) hours as needed.    . nitroGLYCERIN (NITROSTAT)  0.4 MG SL tablet Place 0.4 mg under the tongue as needed.    Marland Kitchen omeprazole (PRILOSEC) 20 MG capsule Take 1 capsule by mouth daily.    . Oxycodone HCl 10 MG TABS Take 10 mg by mouth 4 (four) times daily as needed.    . potassium chloride (K-DUR) 10 MEQ tablet Take 1 tablet by mouth daily.    Marland Kitchen rOPINIRole (REQUIP) 1 MG tablet Take 1 tablet by mouth daily.    Marland Kitchen tiZANidine (ZANAFLEX) 2 MG tablet Take 2 mg by mouth every 8 (eight) hours as needed.    . fluconazole (DIFLUCAN) 150 MG tablet  (Patient not taking: Reported on 02/08/2020)     No current facility-administered medications for this visit.    Review of Systems  Constitutional:  Constitutional negative. HENT: HENT negative.  Eyes: Eyes negative.  GI: Gastrointestinal negative.  Musculoskeletal: Positive for back pain, gait problem, leg pain and joint pain.  Skin: Skin negative.  Hematologic: Hematologic/lymphatic negative.  Psychiatric: Psychiatric  negative.        Objective:  Objective   Vitals:   02/08/20 1110  BP: 106/70  Pulse: 67  Resp: 20  Temp: 97.8 F (36.6 C)  SpO2: 94%  Weight: 193 lb (87.5 kg)  Height: 5\' 7"  (1.702 m)   Body mass index is 30.23 kg/m.  Physical Exam HENT:     Nose:     Comments: Wearing a mask Eyes:     Pupils: Pupils are equal, round, and reactive to light.  Neck:     Vascular: No carotid bruit.     Comments: Well-healed neck incision Cardiovascular:     Pulses: Normal pulses.     Heart sounds: Normal heart sounds.  Pulmonary:     Effort: Pulmonary effort is normal.     Breath sounds: Normal breath sounds.  Abdominal:     General: Abdomen is flat.     Palpations: Abdomen is soft. There is no mass.  Musculoskeletal:     Cervical back: No tenderness.  Skin:    General: Skin is warm and dry.     Capillary Refill: Capillary refill takes less than 2 seconds.  Neurological:     General: No focal deficit present.     Mental Status: She is alert.  Psychiatric:        Mood and Affect: Mood normal.        Behavior: Behavior normal.        Thought Content: Thought content normal.        Judgment: Judgment normal.     Data: I have independent interpreted her ABIs which are greater than 1 bilaterally.  Toe pressure on the right 135 and left 115     Assessment/Plan:     74 year old female with the above-noted history without any history of vascular disease.  Pulses are palpable.  She should be okay for lower extremity surgery as needed.  She can follow-up with me on an as-needed basis.     Waynetta Sandy MD [facility]

## 2020-02-14 DIAGNOSIS — E039 Hypothyroidism, unspecified: Secondary | ICD-10-CM | POA: Diagnosis not present

## 2020-02-14 DIAGNOSIS — G737 Myopathy in diseases classified elsewhere: Secondary | ICD-10-CM | POA: Diagnosis not present

## 2020-02-14 DIAGNOSIS — M1711 Unilateral primary osteoarthritis, right knee: Secondary | ICD-10-CM | POA: Diagnosis not present

## 2020-02-14 DIAGNOSIS — I10 Essential (primary) hypertension: Secondary | ICD-10-CM | POA: Diagnosis not present

## 2020-02-14 DIAGNOSIS — Z79899 Other long term (current) drug therapy: Secondary | ICD-10-CM | POA: Diagnosis not present

## 2020-02-14 DIAGNOSIS — E785 Hyperlipidemia, unspecified: Secondary | ICD-10-CM | POA: Diagnosis not present

## 2020-02-14 DIAGNOSIS — G8929 Other chronic pain: Secondary | ICD-10-CM | POA: Diagnosis not present

## 2020-02-14 DIAGNOSIS — D75839 Thrombocytosis, unspecified: Secondary | ICD-10-CM | POA: Diagnosis not present

## 2020-02-14 DIAGNOSIS — G4700 Insomnia, unspecified: Secondary | ICD-10-CM | POA: Diagnosis not present

## 2020-02-14 DIAGNOSIS — Z23 Encounter for immunization: Secondary | ICD-10-CM | POA: Diagnosis not present

## 2020-02-14 DIAGNOSIS — E559 Vitamin D deficiency, unspecified: Secondary | ICD-10-CM | POA: Diagnosis not present

## 2020-02-14 DIAGNOSIS — J309 Allergic rhinitis, unspecified: Secondary | ICD-10-CM | POA: Diagnosis not present

## 2020-02-15 ENCOUNTER — Telehealth: Payer: Self-pay

## 2020-02-15 NOTE — Telephone Encounter (Signed)
   [facility] Pre-operative Risk Assessment    HEARTCARE STAFF: - Please ensure there is not already an duplicate clearance open for this procedure. - Under Visit Info/Reason for Call, type in Other and utilize the format Clearance MM/DD/YY or Clearance TBD. Do not use dashes or single digits. - If request is for dental extraction, please clarify the # of teeth to be extracted.  Request for surgical clearance:  1. What type of surgery is being performed?  Right Total Knee Replacement  2. When is this surgery scheduled? 03/12/2020  3. What type of clearance is required (medical clearance vs. Pharmacy clearance to hold med vs. Both)? MEDICAL  4. Are there any medications that need to be held prior to surgery and how long?  None listed  5. Practice name and name of physician performing surgery? Dr. Len Childs /  [facility]  6. What is the office phone number? [phone]   7.   What is the office fax number? [phone]  8.   Anesthesia type (None, local, MAC, general) ? Spinal   Toni Arthurs 02/15/2020, 10:17 AM  _________________________________________________________________   (provider comments below)

## 2020-02-16 NOTE — Telephone Encounter (Signed)
   Primary Cardiologist: Jenean Lindau, MD  Chart reviewed as part of pre-operative protocol coverage. Patient is scheduled for a 6 month follow-up with Dr. Geraldo Pitter 02/21/20. Would be reasonable to address preoperative status at that time.   Pre-op covering staff: - Patient already has an upcoming appointment within acceptable timeframe- please add "pre-op clearance" to the appointment notes so provider is aware. - Please contact requesting surgeon's office via preferred method (i.e, phone, fax) to inform them of need for appointment prior to surgery.   Abigail Butts, PA-C  02/16/2020, 10:36 PM

## 2020-02-20 ENCOUNTER — Other Ambulatory Visit: Payer: Self-pay

## 2020-02-20 DIAGNOSIS — G629 Polyneuropathy, unspecified: Secondary | ICD-10-CM | POA: Insufficient documentation

## 2020-02-20 DIAGNOSIS — E079 Disorder of thyroid, unspecified: Secondary | ICD-10-CM | POA: Insufficient documentation

## 2020-02-20 DIAGNOSIS — I10 Essential (primary) hypertension: Secondary | ICD-10-CM | POA: Insufficient documentation

## 2020-02-21 ENCOUNTER — Ambulatory Visit: Payer: PPO | Admitting: Cardiology

## 2020-02-21 ENCOUNTER — Encounter: Payer: Self-pay | Admitting: Cardiology

## 2020-02-21 ENCOUNTER — Other Ambulatory Visit: Payer: Self-pay

## 2020-02-21 VITALS — BP 106/66 | HR 100 | Ht 67.0 in | Wt 189.0 lb

## 2020-02-21 DIAGNOSIS — Z01810 Encounter for preprocedural cardiovascular examination: Secondary | ICD-10-CM | POA: Diagnosis not present

## 2020-02-21 DIAGNOSIS — I10 Essential (primary) hypertension: Secondary | ICD-10-CM | POA: Diagnosis not present

## 2020-02-21 DIAGNOSIS — I351 Nonrheumatic aortic (valve) insufficiency: Secondary | ICD-10-CM

## 2020-02-21 NOTE — Progress Notes (Signed)
Cardiology Office Note:    Date:  02/21/2020   ID:  Lauren Roth, DOB Jun 09, 1945, MRN [id number]  PCP:  Nicholos Johns, MD  Cardiologist:  Jenean Lindau, MD   Referring MD: Nicholos Johns, MD    ASSESSMENT:    1. Pre-operative cardiovascular examination   2. Essential hypertension   3. Moderate aortic regurgitation    PLAN:    In order of problems listed above:  1. Primary prevention stressed with the patient.  Importance of compliance with diet medication stressed and she vocalized understanding. 2. Preoperative cardiovascular risk stratification: Patient was explained about this process.  She has a sedentary lifestyle and multiple risk factors for coronary artery disease and therefore we will do a Lexiscan sestamibi.  She is agreeable.  If this is negative then she is not at high risk for coronary events during the aforementioned surgery.  Meticulous hemodynamic monitoring will further reduce the risk of coronary events. 3. Moderate aortic regurgitation: Stable at this time.  Echocardiogram reviewed and discussed with the patient.  Details mentioned above. 4. Essential hypertension: Stable.  Lifestyle modification urged 5. Patient will be seen in follow-up appointment in 6 months or earlier if the patient has any concerns    Medication Adjustments/Labs and Tests Ordered: Current medicines are reviewed at length with the patient today.  Concerns regarding medicines are outlined above.  No orders of the defined types were placed in this encounter.  No orders of the defined types were placed in this encounter.    No chief complaint on file.    History of Present Illness:    Lauren Roth is a 74 y.o. female.  Patient has past medical history of essential hypertension and moderate aortic regurgitation.  She denies any problems at this time and takes care of activities of daily living.  She has ambulation is limited because of her right knee which is  needing replacement surgery.  She is here for preop assessment.  Again she denies any chest pain orthopnea or PND.  At the time of my evaluation, the patient is alert awake oriented and in no distress.  Past Medical History:  Diagnosis Date  . Allergic rhinitis 01/16/2009   Qualifier: Diagnosis of  By: Loanne Drilling MD, Jacelyn Pi   Formatting of this note might be different from the original. Overview:  Qualifier: Diagnosis of  By: Loanne Drilling MD, Jacelyn Pi Arthritis   . DEGENERATIVE DISC DISEASE, LUMBOSACRAL SPINE 01/16/2009   Qualifier: Diagnosis of  By: Loanne Drilling MD, Jacelyn Pi   . Depression   . DEPRESSION 01/16/2009   Qualifier: Diagnosis of  By: Loanne Drilling MD, Jacelyn Pi   . DIAPHORESIS 01/16/2009   Qualifier: Diagnosis of  By: Loanne Drilling MD, Jacelyn Pi   . Diaphoresis 01/16/2009   Qualifier: Diagnosis of  By: Loanne Drilling MD, Roena Malady of this note might be different from the original. Overview:  Qualifier: Diagnosis of  By: Loanne Drilling MD, Jacelyn Pi Edema 01/16/2009   Qualifier: Diagnosis of  By: Loanne Drilling MD, Roena Malady of this note might be different from the original. Overview:  Qualifier: Diagnosis of  By: Loanne Drilling MD, Jacelyn Pi Encounter for therapeutic drug monitoring 08/25/2010  . Essential hypertension 01/28/2017  . Essential thrombocytosis ([place]) 11/25/2017  . Fibromyalgia   . GERD 01/16/2009   Qualifier: Diagnosis of  By: Loanne Drilling MD, Jacelyn Pi   . Hypertension   . HYPERTENSION 01/16/2009   Qualifier: Diagnosis of  By: Loanne Drilling MD, Jacelyn Pi Hypokalemia 01/16/2009   Qualifier: Diagnosis of  By: Loanne Drilling MD, Roena Malady of this note might be different from the original. Overview:  Qualifier: Diagnosis of  By: Loanne Drilling MD, Jacelyn Pi Left hip pain 05/24/2019  . Lumbosacral spondylosis 10/09/2010  . Memory loss 01/16/2009   Qualifier: Diagnosis of  By: Loanne Drilling MD, Roena Malady of this note might be different from the original. Overview:  Qualifier: Diagnosis of  By: Loanne Drilling MD, Jacelyn Pi Menopausal and  postmenopausal disorder 01/16/2009   Qualifier: Diagnosis of  By: Loanne Drilling MD, Jacelyn Pi   Formatting of this note might be different from the original. Overview:  Qualifier: Diagnosis of  By: Loanne Drilling MD, Jacelyn Pi Moderate aortic regurgitation 05/21/2019  . Neuropathy   . Pain in soft tissues of limb 06/20/2012   Formatting of this note might be different from the original. Overview:  IMPRESSION: Left knee pain Formatting of this note might be different from the original. IMPRESSION: Left knee pain  . Pain in thoracic spine 07/30/2013  . Palpitations 01/28/2017  . Pre-operative cardiovascular examination 11/25/2017  . Primary osteoarthritis of right knee 10/06/2014  . Restless legs syndrome 08/25/2010  . Shortness of breath on exertion 01/28/2017  . SI (sacroiliac) pain 05/10/2016  . Thoracic spondylosis 11/20/2010   Overview:  Overview:  IMPRESSION: Thoracic radiculopathy Overview:  IMPRESSION: Thoracic radiculopathy  Formatting of this note might be different from the original. Overview:  IMPRESSION: Thoracic radiculopathy Formatting of this note might be different from the original. IMPRESSION: Thoracic radiculopathy  . Thrombocythemia, essential ([place]) 09/12/2013  . Thyroid disease   . TMJ (dislocation of temporomandibular joint) 07/30/2013  . Unspecified hypothyroidism 01/16/2009   Qualifier: Diagnosis of  By: Loanne Drilling MD, Jacelyn Pi   . Uterine cancer ([place]) 07/30/2013    Past Surgical History:  Procedure Laterality Date  . APPENDECTOMY    . CERVICAL DISC SURGERY    . CHOLECYSTECTOMY    . FRACTURE SURGERY Left    Left arm has a metal plate  . KNEE SURGERY      Current Medications: Current Meds  Medication Sig  . amitriptyline (ELAVIL) 100 MG tablet Take 1 tablet by mouth daily.  . ARIPiprazole (ABILIFY) 2 MG tablet Take 2 mg by mouth daily.  Marland Kitchen aspirin EC 81 MG tablet Take 81 mg by mouth daily.  . BELSOMRA 10 MG TABS Take 10 mg by mouth at bedtime as needed.  Marland Kitchen buPROPion (WELLBUTRIN XL) 300 MG 24 hr  tablet Take 1 tablet by mouth daily.  . cloNIDine (CATAPRES) 0.1 MG tablet Take 1 tablet by mouth 2 (two) times daily.  . diclofenac sodium (VOLTAREN) 1 % GEL Apply 1 % topically as needed.  . donepezil (ARICEPT) 10 MG tablet Take 10 mg by mouth daily.  . DULoxetine (CYMBALTA) 60 MG capsule Take 60 mg by mouth daily.  Marland Kitchen estrogens, conjugated, (PREMARIN) 0.625 MG tablet Take 1 tablet by mouth daily.  . furosemide (LASIX) 20 MG tablet Take 1 tablet by mouth daily.  Marland Kitchen levothyroxine (SYNTHROID, LEVOTHROID) 25 MCG tablet Take 1 tablet by mouth daily.  Marland Kitchen lisinopril-hydrochlorothiazide (PRINZIDE,ZESTORETIC) 20-12.5 MG tablet Take 1 tablet by mouth daily.  Marland Kitchen loratadine (CLARITIN) 10 MG tablet Take 10 mg by mouth daily.  . metoCLOPramide (REGLAN) 10 MG tablet Take 1 tablet by mouth daily.  . montelukast (SINGULAIR) 10 MG tablet Take 10 mg by mouth daily.  Marland Kitchen  morphine (MS CONTIN) 30 MG 12 hr tablet Take 30 mg by mouth every 8 (eight) hours as needed.  . nitroGLYCERIN (NITROSTAT) 0.4 MG SL tablet Place 0.4 mg under the tongue as needed.  Marland Kitchen omeprazole (PRILOSEC) 20 MG capsule Take 1 capsule by mouth daily.  . Oxycodone HCl 10 MG TABS Take 10 mg by mouth 4 (four) times daily as needed.  . potassium chloride (K-DUR) 10 MEQ tablet Take 1 tablet by mouth daily.  Marland Kitchen rOPINIRole (REQUIP) 1 MG tablet Take 1 tablet by mouth daily.  Marland Kitchen tiZANidine (ZANAFLEX) 2 MG tablet Take 2 mg by mouth every 8 (eight) hours as needed.  . traZODone (DESYREL) 100 MG tablet Take 100 mg by mouth at bedtime.  . Vitamin D, Ergocalciferol, (DRISDOL) 1.25 MG (50000 UNIT) CAPS capsule Take 50,000 Units by mouth once a week.     Allergies:   Cottonseed oil and Codeine sulfate   Social History   Socioeconomic History  . Marital status: Married    Spouse name: Not on file  . Number of children: Not on file  . Years of education: Not on file  . Highest education level: Not on file  Occupational History  . Not on file  Tobacco Use  .  Smoking status: Former Research scientist (life sciences)  . Smokeless tobacco: Never Used  . Tobacco comment: at age 87  Vaping Use  . Vaping Use: Never used  Substance and Sexual Activity  . Alcohol use: No  . Drug use: No  . Sexual activity: Not on file  Other Topics Concern  . Not on file  Social History Narrative  . Not on file   Social Determinants of Health   Financial Resource Strain:   . Difficulty of Paying Living Expenses: Not on file  Food Insecurity:   . Worried About Charity fundraiser in the Last Year: Not on file  . Ran Out of Food in the Last Year: Not on file  Transportation Needs:   . Lack of Transportation (Medical): Not on file  . Lack of Transportation (Non-Medical): Not on file  Physical Activity:   . Days of Exercise per Week: Not on file  . Minutes of Exercise per Session: Not on file  Stress:   . Feeling of Stress : Not on file  Social Connections:   . Frequency of Communication with Friends and Family: Not on file  . Frequency of Social Gatherings with Friends and Family: Not on file  . Attends Religious Services: Not on file  . Active Member of Clubs or Organizations: Not on file  . Attends Archivist Meetings: Not on file  . Marital Status: Not on file     Family History: The patient's family history includes Deep vein thrombosis in her father; Heart disease in her brother, brother, and father; Heart failure in her father; Pulmonary embolism in her brother.  ROS:   Please see the history of present illness.    All other systems reviewed and are negative.  EKGs/Labs/Other Studies Reviewed:    The following studies were reviewed today: EKG reveals sinus rhythm and nonspecific ST-T changes.   IMPRESSIONS    1. Left ventricular ejection fraction, by estimation, is 60 to 65%. The  left ventricle has normal function. The left ventricle has no regional  wall motion abnormalities. Left ventricular diastolic parameters are  consistent with Grade I  diastolic  dysfunction (impaired relaxation).  2. Right ventricular systolic function is normal. The right ventricular  size is  normal. There is mildly elevated pulmonary artery systolic  pressure.  3. Left atrial size was mild to moderately dilated.  4. The mitral valve is normal in structure. Mild mitral valve  regurgitation. No evidence of mitral stenosis.  5. The aortic valve is normal in structure. Aortic valve regurgitation is  moderate. Mild aortic valve sclerosis is present, with no evidence of  aortic valve stenosis.  6. The inferior vena cava is normal in size with greater than 50%  respiratory variability, suggesting right atrial pressure of 3 mmHg.    Recent Labs: No results found for requested labs within last 8760 hours.  Recent Lipid Panel No results found for: CHOL, TRIG, HDL, CHOLHDL, VLDL, LDLCALC, LDLDIRECT  Physical Exam:    VS:  BP 106/66   Pulse 100   Ht 5\' 7"  (1.702 m)   Wt 189 lb (85.7 kg)   SpO2 97%   BMI 29.60 kg/m     Wt Readings from Last 3 Encounters:  02/21/20 189 lb (85.7 kg)  02/08/20 193 lb (87.5 kg)  05/21/19 208 lb (94.3 kg)     GEN: Patient is in no acute distress HEENT: Normal NECK: No JVD; No carotid bruits LYMPHATICS: No lymphadenopathy CARDIAC: Hear sounds regular, 2/6 systolic murmur at the apex. RESPIRATORY:  Clear to auscultation without rales, wheezing or rhonchi  ABDOMEN: Soft, non-tender, non-distended MUSCULOSKELETAL:  No edema; No deformity  SKIN: Warm and dry NEUROLOGIC:  Alert and oriented x 3 PSYCHIATRIC:  Normal affect   Signed, Jenean Lindau, MD  02/21/2020 1:35 PM    [facility]

## 2020-02-21 NOTE — Patient Instructions (Signed)
Medication Instructions:  No medication changes. *If you need a refill on your cardiac medications before your next appointment, please call your pharmacy*   Lab Work: None ordered If you have labs (blood work) drawn today and your tests are completely normal, you will receive your results only by: . MyChart Message (if you have MyChart) OR . A paper copy in the mail If you have any lab test that is abnormal or we need to change your treatment, we will call you to review the results.   Testing/Procedures: Your physician has requested that you have a lexiscan myoview. For further information please visit [email or web address]. Please follow instruction sheet, as given.  The test will take approximately 3 to 4 hours to complete; you may bring reading material.  If someone comes with you to your appointment, they will need to remain in the main lobby due to limited space in the testing area.   How to prepare for your Myocardial Perfusion Test: . Do not eat or drink 3 hours prior to your test, except you may have water. . Do not consume products containing caffeine (regular or decaffeinated) 12 hours prior to your test. (ex: coffee, chocolate, sodas, tea). . Do bring a list of your current medications with you.  If not listed below, you may take your medications as normal. . Do wear comfortable clothes (no dresses or overalls) and walking shoes, tennis shoes preferred (No heels or open toe shoes are allowed). . Do NOT wear cologne, perfume, aftershave, or lotions (deodorant is allowed). . If these instructions are not followed, your test will have to be rescheduled.    Follow-Up: At [facility], you and your health needs are our priority.  As part of our continuing mission to provide you with exceptional heart care, we have created designated Provider Care Teams.  These Care Teams include your primary Cardiologist (physician) and Advanced Practice Providers (APPs -  Physician Assistants and  Nurse Practitioners) who all work together to provide you with the care you need, when you need it.  We recommend signing up for the patient portal called "MyChart".  Sign up information is provided on this After Visit Summary.  MyChart is used to connect with patients for Virtual Visits (Telemedicine).  Patients are able to view lab/test results, encounter notes, upcoming appointments, etc.  Non-urgent messages can be sent to your provider as well.   To learn more about what you can do with MyChart, go to [email or web address].    Your next appointment:   6 month(s)  The format for your next appointment:   In Person  Provider:   Rajan Revankar, MD   Other Instructions  Cardiac Nuclear Scan A cardiac nuclear scan is a test that is done to check the flow of blood to your heart. It is done when you are resting and when you are exercising. The test looks for problems such as:  Not enough blood reaching a portion of the heart.  The heart muscle not working as it should. You may need this test if:  You have heart disease.  You have had lab results that are not normal.  You have had heart surgery or a balloon procedure to open up blocked arteries (angioplasty).  You have chest pain.  You have shortness of breath. In this test, a special dye (tracer) is put into your bloodstream. The tracer will travel to your heart. A camera will then take pictures of your heart to see how the tracer moves through   your heart. This test is usually done at a hospital and takes 2-4 hours. Tell a doctor about:  Any allergies you have.  All medicines you are taking, including vitamins, herbs, eye drops, creams, and over-the-counter medicines.  Any problems you or family members have had with anesthetic medicines.  Any blood disorders you have.  Any surgeries you have had.  Any medical conditions you have.  Whether you are pregnant or may be pregnant. What are the risks? Generally, this is a  safe test. However, problems may occur, such as:  Serious chest pain and heart attack. This is only a risk if the stress portion of the test is done.  Rapid heartbeat.  A feeling of warmth in your chest. This feeling usually does not last long.  Allergic reaction to the tracer. What happens before the test?  Ask your doctor about changing or stopping your normal medicines. This is important.  Follow instructions from your doctor about what you cannot eat or drink.  Remove your jewelry on the day of the test. What happens during the test?  An IV tube will be inserted into one of your veins.  Your doctor will give you a small amount of tracer through the IV tube.  You will wait for 20-40 minutes while the tracer moves through your bloodstream.  Your heart will be monitored with an electrocardiogram (ECG).  You will lie down on an exam table.  Pictures of your heart will be taken for about 15-20 minutes.  You may also have a stress test. For this test, one of these things may be done: ? You will be asked to exercise on a treadmill or a stationary bike. ? You will be given medicines that will make your heart work harder. This is done if you are unable to exercise.  When blood flow to your heart has peaked, a tracer will again be given through the IV tube.  After 20-40 minutes, you will get back on the exam table. More pictures will be taken of your heart.  Depending on the tracer that is used, more pictures may need to be taken 3-4 hours later.  Your IV tube will be removed when the test is over. The test may vary among doctors and hospitals. What happens after the test?  Ask your doctor: ? Whether you can return to your normal schedule, including diet, activities, and medicines. ? Whether you should drink more fluids. This will help to remove the tracer from your body. Drink enough fluid to keep your pee (urine) pale yellow.  Ask your doctor, or the department that is  doing the test: ? When will my results be ready? ? How will I get my results? Summary  A cardiac nuclear scan is a test that is done to check the flow of blood to your heart.  Tell your doctor whether you are pregnant or may be pregnant.  Before the test, ask your doctor about changing or stopping your normal medicines. This is important.  Ask your doctor whether you can return to your normal activities. You may be asked to drink more fluids. This information is not intended to replace advice given to you by your health care provider. Make sure you discuss any questions you have with your health care provider. Document Revised: 07/26/2018 Document Reviewed: 09/19/2017 Elsevier Patient Education  2020 Elsevier Inc.   

## 2020-02-25 DIAGNOSIS — M961 Postlaminectomy syndrome, not elsewhere classified: Secondary | ICD-10-CM | POA: Diagnosis not present

## 2020-02-25 DIAGNOSIS — M47814 Spondylosis without myelopathy or radiculopathy, thoracic region: Secondary | ICD-10-CM | POA: Diagnosis not present

## 2020-02-25 DIAGNOSIS — M797 Fibromyalgia: Secondary | ICD-10-CM | POA: Diagnosis not present

## 2020-02-26 ENCOUNTER — Telehealth: Payer: Self-pay | Admitting: *Deleted

## 2020-02-26 NOTE — Telephone Encounter (Signed)
Left message on voicemail per DPR in reference to upcoming appointment scheduled on 02/28/2020 at [facility] with detailed instructions given per Myocardial Perfusion Study Information Sheet for the test. LM to arrive 15 minutes early, and that it is imperative to arrive on time for appointment to keep from having the test rescheduled. If you need to cancel or reschedule your appointment, please call the office within 24 hours of your appointment. Failure to do so may result in a cancellation of your appointment, and a $50 no show fee. Phone number given for call back for any questions. No mychart available.Lauren Roth, [name]

## 2020-02-28 ENCOUNTER — Ambulatory Visit (INDEPENDENT_AMBULATORY_CARE_PROVIDER_SITE_OTHER): Payer: PPO

## 2020-02-28 ENCOUNTER — Other Ambulatory Visit: Payer: Self-pay

## 2020-02-28 DIAGNOSIS — I10 Essential (primary) hypertension: Secondary | ICD-10-CM | POA: Diagnosis not present

## 2020-02-28 DIAGNOSIS — Z01810 Encounter for preprocedural cardiovascular examination: Secondary | ICD-10-CM

## 2020-02-28 DIAGNOSIS — I351 Nonrheumatic aortic (valve) insufficiency: Secondary | ICD-10-CM

## 2020-02-28 LAB — MYOCARDIAL PERFUSION IMAGING
LV dias vol: 71 mL (ref 46–106)
LV sys vol: 22 mL
Peak HR: 98 {beats}/min
Rest HR: 79 {beats}/min
SDS: 3
SRS: 2
SSS: 5
TID: 1.15

## 2020-02-28 MED ORDER — TECHNETIUM TC 99M TETROFOSMIN IV KIT
10.6000 | PACK | Freq: Once | INTRAVENOUS | Status: AC | PRN
  Administered 2020-02-28: 10.6 via INTRAVENOUS

## 2020-02-28 MED ORDER — TECHNETIUM TC 99M TETROFOSMIN IV KIT
31.8000 | PACK | Freq: Once | INTRAVENOUS | Status: AC | PRN
  Administered 2020-02-28: 31.8 via INTRAVENOUS

## 2020-02-28 MED ORDER — REGADENOSON 0.4 MG/5ML IV SOLN
0.4000 mg | Freq: Once | INTRAVENOUS | Status: AC
Start: 2020-02-28 — End: 2020-02-28
  Administered 2020-02-28: 0.4 mg via INTRAVENOUS

## 2020-03-04 NOTE — Telephone Encounter (Signed)
Patient calling to follow up on clearance. She states [facility] told her they have not received the cardiac clearance.

## 2020-03-04 NOTE — Telephone Encounter (Signed)
   Primary Cardiologist: Jenean Lindau, MD  Chart reviewed as part of pre-operative protocol coverage. The patient was seen by Dr. Salley Scarlet and had low risk stress test. Given past medical history and time since last visit, based on ACC/AHA guidelines, Lauren Roth would be at acceptable risk for the planned procedure without further cardiovascular testing.   I will route this recommendation to the requesting party via Epic fax function and remove from pre-op pool.  Please call with questions.  Derby Line, [place] 03/04/2020, 11:41 AM

## 2020-03-05 DIAGNOSIS — Z01818 Encounter for other preprocedural examination: Secondary | ICD-10-CM | POA: Diagnosis not present

## 2020-03-06 DIAGNOSIS — G4700 Insomnia, unspecified: Secondary | ICD-10-CM | POA: Diagnosis not present

## 2020-03-06 DIAGNOSIS — F329 Major depressive disorder, single episode, unspecified: Secondary | ICD-10-CM | POA: Diagnosis not present

## 2020-03-06 DIAGNOSIS — Z6829 Body mass index (BMI) 29.0-29.9, adult: Secondary | ICD-10-CM | POA: Diagnosis not present

## 2020-03-06 DIAGNOSIS — F419 Anxiety disorder, unspecified: Secondary | ICD-10-CM | POA: Diagnosis not present

## 2020-03-10 DIAGNOSIS — K219 Gastro-esophageal reflux disease without esophagitis: Secondary | ICD-10-CM | POA: Diagnosis not present

## 2020-03-10 DIAGNOSIS — G629 Polyneuropathy, unspecified: Secondary | ICD-10-CM | POA: Insufficient documentation

## 2020-03-10 DIAGNOSIS — G2581 Restless legs syndrome: Secondary | ICD-10-CM | POA: Diagnosis not present

## 2020-03-10 DIAGNOSIS — E039 Hypothyroidism, unspecified: Secondary | ICD-10-CM | POA: Diagnosis not present

## 2020-03-10 DIAGNOSIS — I10 Essential (primary) hypertension: Secondary | ICD-10-CM | POA: Diagnosis not present

## 2020-03-10 DIAGNOSIS — M1711 Unilateral primary osteoarthritis, right knee: Secondary | ICD-10-CM | POA: Diagnosis not present

## 2020-03-10 DIAGNOSIS — Z01810 Encounter for preprocedural cardiovascular examination: Secondary | ICD-10-CM | POA: Diagnosis not present

## 2020-03-11 DIAGNOSIS — M1711 Unilateral primary osteoarthritis, right knee: Secondary | ICD-10-CM | POA: Diagnosis not present

## 2020-03-11 DIAGNOSIS — Z01810 Encounter for preprocedural cardiovascular examination: Secondary | ICD-10-CM | POA: Diagnosis not present

## 2020-03-14 DIAGNOSIS — G2581 Restless legs syndrome: Secondary | ICD-10-CM | POA: Diagnosis not present

## 2020-03-14 DIAGNOSIS — E039 Hypothyroidism, unspecified: Secondary | ICD-10-CM | POA: Diagnosis not present

## 2020-03-14 DIAGNOSIS — F418 Other specified anxiety disorders: Secondary | ICD-10-CM | POA: Diagnosis not present

## 2020-03-14 DIAGNOSIS — K219 Gastro-esophageal reflux disease without esophagitis: Secondary | ICD-10-CM | POA: Diagnosis not present

## 2020-03-14 DIAGNOSIS — M797 Fibromyalgia: Secondary | ICD-10-CM | POA: Diagnosis not present

## 2020-03-14 DIAGNOSIS — M5136 Other intervertebral disc degeneration, lumbar region: Secondary | ICD-10-CM | POA: Diagnosis not present

## 2020-03-14 DIAGNOSIS — I10 Essential (primary) hypertension: Secondary | ICD-10-CM | POA: Diagnosis not present

## 2020-03-14 DIAGNOSIS — Z96653 Presence of artificial knee joint, bilateral: Secondary | ICD-10-CM | POA: Diagnosis not present

## 2020-03-14 DIAGNOSIS — Z471 Aftercare following joint replacement surgery: Secondary | ICD-10-CM | POA: Diagnosis not present

## 2020-03-14 DIAGNOSIS — M961 Postlaminectomy syndrome, not elsewhere classified: Secondary | ICD-10-CM | POA: Diagnosis not present

## 2020-03-14 DIAGNOSIS — M47814 Spondylosis without myelopathy or radiculopathy, thoracic region: Secondary | ICD-10-CM | POA: Diagnosis not present

## 2020-03-14 DIAGNOSIS — D689 Coagulation defect, unspecified: Secondary | ICD-10-CM | POA: Diagnosis not present

## 2020-03-14 DIAGNOSIS — M4727 Other spondylosis with radiculopathy, lumbosacral region: Secondary | ICD-10-CM | POA: Diagnosis not present

## 2020-03-19 DIAGNOSIS — M4727 Other spondylosis with radiculopathy, lumbosacral region: Secondary | ICD-10-CM | POA: Diagnosis not present

## 2020-03-19 DIAGNOSIS — E039 Hypothyroidism, unspecified: Secondary | ICD-10-CM | POA: Diagnosis not present

## 2020-03-19 DIAGNOSIS — I10 Essential (primary) hypertension: Secondary | ICD-10-CM | POA: Diagnosis not present

## 2020-03-19 DIAGNOSIS — M961 Postlaminectomy syndrome, not elsewhere classified: Secondary | ICD-10-CM | POA: Diagnosis not present

## 2020-03-19 DIAGNOSIS — M47814 Spondylosis without myelopathy or radiculopathy, thoracic region: Secondary | ICD-10-CM | POA: Diagnosis not present

## 2020-03-19 DIAGNOSIS — M797 Fibromyalgia: Secondary | ICD-10-CM | POA: Diagnosis not present

## 2020-03-19 DIAGNOSIS — D689 Coagulation defect, unspecified: Secondary | ICD-10-CM | POA: Diagnosis not present

## 2020-03-19 DIAGNOSIS — F418 Other specified anxiety disorders: Secondary | ICD-10-CM | POA: Diagnosis not present

## 2020-03-19 DIAGNOSIS — M5136 Other intervertebral disc degeneration, lumbar region: Secondary | ICD-10-CM | POA: Diagnosis not present

## 2020-03-19 DIAGNOSIS — Z96653 Presence of artificial knee joint, bilateral: Secondary | ICD-10-CM | POA: Diagnosis not present

## 2020-03-19 DIAGNOSIS — Z471 Aftercare following joint replacement surgery: Secondary | ICD-10-CM | POA: Diagnosis not present

## 2020-03-19 DIAGNOSIS — G2581 Restless legs syndrome: Secondary | ICD-10-CM | POA: Diagnosis not present

## 2020-03-19 DIAGNOSIS — K219 Gastro-esophageal reflux disease without esophagitis: Secondary | ICD-10-CM | POA: Diagnosis not present

## 2020-03-24 DIAGNOSIS — Z96651 Presence of right artificial knee joint: Secondary | ICD-10-CM | POA: Diagnosis not present

## 2020-03-24 DIAGNOSIS — Z471 Aftercare following joint replacement surgery: Secondary | ICD-10-CM | POA: Diagnosis not present

## 2020-04-01 DIAGNOSIS — M6281 Muscle weakness (generalized): Secondary | ICD-10-CM | POA: Diagnosis not present

## 2020-04-01 DIAGNOSIS — Z96651 Presence of right artificial knee joint: Secondary | ICD-10-CM | POA: Diagnosis not present

## 2020-04-01 DIAGNOSIS — R5383 Other fatigue: Secondary | ICD-10-CM | POA: Diagnosis not present

## 2020-04-01 DIAGNOSIS — M25661 Stiffness of right knee, not elsewhere classified: Secondary | ICD-10-CM | POA: Diagnosis not present

## 2020-04-01 DIAGNOSIS — M1711 Unilateral primary osteoarthritis, right knee: Secondary | ICD-10-CM | POA: Diagnosis not present

## 2020-04-01 DIAGNOSIS — R2689 Other abnormalities of gait and mobility: Secondary | ICD-10-CM | POA: Diagnosis not present

## 2020-04-01 DIAGNOSIS — R2681 Unsteadiness on feet: Secondary | ICD-10-CM | POA: Diagnosis not present

## 2020-04-01 DIAGNOSIS — M25461 Effusion, right knee: Secondary | ICD-10-CM | POA: Diagnosis not present

## 2020-04-16 ENCOUNTER — Other Ambulatory Visit: Payer: Self-pay | Admitting: Hematology and Oncology

## 2020-04-16 DIAGNOSIS — D473 Essential (hemorrhagic) thrombocythemia: Secondary | ICD-10-CM

## 2020-04-25 DIAGNOSIS — M1711 Unilateral primary osteoarthritis, right knee: Secondary | ICD-10-CM | POA: Diagnosis not present

## 2020-04-25 DIAGNOSIS — M25661 Stiffness of right knee, not elsewhere classified: Secondary | ICD-10-CM | POA: Diagnosis not present

## 2020-04-25 DIAGNOSIS — R2689 Other abnormalities of gait and mobility: Secondary | ICD-10-CM | POA: Diagnosis not present

## 2020-04-25 DIAGNOSIS — Z96651 Presence of right artificial knee joint: Secondary | ICD-10-CM | POA: Diagnosis not present

## 2020-04-25 DIAGNOSIS — M25461 Effusion, right knee: Secondary | ICD-10-CM | POA: Diagnosis not present

## 2020-04-25 DIAGNOSIS — M6281 Muscle weakness (generalized): Secondary | ICD-10-CM | POA: Diagnosis not present

## 2020-04-25 DIAGNOSIS — R5383 Other fatigue: Secondary | ICD-10-CM | POA: Diagnosis not present

## 2020-04-25 DIAGNOSIS — R2681 Unsteadiness on feet: Secondary | ICD-10-CM | POA: Diagnosis not present

## 2020-04-25 DIAGNOSIS — M25561 Pain in right knee: Secondary | ICD-10-CM | POA: Diagnosis not present

## 2020-04-30 ENCOUNTER — Inpatient Hospital Stay: Payer: PPO | Admitting: Oncology

## 2020-04-30 ENCOUNTER — Inpatient Hospital Stay: Payer: PPO

## 2020-04-30 DIAGNOSIS — M546 Pain in thoracic spine: Secondary | ICD-10-CM | POA: Diagnosis not present

## 2020-04-30 DIAGNOSIS — Z79899 Other long term (current) drug therapy: Secondary | ICD-10-CM | POA: Diagnosis not present

## 2020-04-30 DIAGNOSIS — M961 Postlaminectomy syndrome, not elsewhere classified: Secondary | ICD-10-CM | POA: Diagnosis not present

## 2020-04-30 DIAGNOSIS — M797 Fibromyalgia: Secondary | ICD-10-CM | POA: Diagnosis not present

## 2020-04-30 DIAGNOSIS — M533 Sacrococcygeal disorders, not elsewhere classified: Secondary | ICD-10-CM | POA: Diagnosis not present

## 2020-05-19 DIAGNOSIS — E785 Hyperlipidemia, unspecified: Secondary | ICD-10-CM | POA: Diagnosis not present

## 2020-05-19 DIAGNOSIS — D75839 Thrombocytosis, unspecified: Secondary | ICD-10-CM | POA: Diagnosis not present

## 2020-05-19 DIAGNOSIS — G5793 Unspecified mononeuropathy of bilateral lower limbs: Secondary | ICD-10-CM | POA: Diagnosis not present

## 2020-05-19 DIAGNOSIS — E559 Vitamin D deficiency, unspecified: Secondary | ICD-10-CM | POA: Diagnosis not present

## 2020-05-19 DIAGNOSIS — Z6834 Body mass index (BMI) 34.0-34.9, adult: Secondary | ICD-10-CM | POA: Diagnosis not present

## 2020-05-19 DIAGNOSIS — M1711 Unilateral primary osteoarthritis, right knee: Secondary | ICD-10-CM | POA: Diagnosis not present

## 2020-05-19 DIAGNOSIS — I10 Essential (primary) hypertension: Secondary | ICD-10-CM | POA: Diagnosis not present

## 2020-05-19 DIAGNOSIS — E039 Hypothyroidism, unspecified: Secondary | ICD-10-CM | POA: Diagnosis not present

## 2020-05-19 DIAGNOSIS — G737 Myopathy in diseases classified elsewhere: Secondary | ICD-10-CM | POA: Diagnosis not present

## 2020-05-19 DIAGNOSIS — G4700 Insomnia, unspecified: Secondary | ICD-10-CM | POA: Diagnosis not present

## 2020-05-19 DIAGNOSIS — G8929 Other chronic pain: Secondary | ICD-10-CM | POA: Diagnosis not present

## 2020-05-19 DIAGNOSIS — J309 Allergic rhinitis, unspecified: Secondary | ICD-10-CM | POA: Diagnosis not present

## 2020-05-22 NOTE — Progress Notes (Signed)
[facility]  [address] [place],  [place]  [address] [phone]  Clinic Day:  05/23/2020  Referring physician: Nicholos Johns, MD   HISTORY OF PRESENT ILLNESS:  The patient is a 75 y.o. female with essential thrombocythemia.  As her platelet count has never risen above 600,000, her disease has been followed conservatively.  She comes in today for routine follow up.  Since her last visit, the patient has been doing well.  She denies having any clotting complications as it pertains to her underlying myeloproliferative disorder.  PHYSICAL EXAM:  Blood pressure 140/67, pulse 73, temperature 98.4 F (36.9 C), resp. rate 14, height 5\' 7"  (1.702 m), weight 191 lb 6.4 oz (86.8 kg), SpO2 95 %. Wt Readings from Last 3 Encounters:  05/23/20 191 lb 6.4 oz (86.8 kg)  02/28/20 189 lb (85.7 kg)  02/21/20 189 lb (85.7 kg)   Body mass index is 29.98 kg/m. Performance status (ECOG): 0 - Asymptomatic Physical Exam Constitutional:      Appearance: Normal appearance. She is not ill-appearing.  HENT:     Mouth/Throat:     Mouth: Mucous membranes are moist.     Pharynx: Oropharynx is clear. No oropharyngeal exudate or posterior oropharyngeal erythema.  Cardiovascular:     Rate and Rhythm: Normal rate and regular rhythm.     Heart sounds: No murmur heard. No friction rub. No gallop.   Pulmonary:     Effort: Pulmonary effort is normal. No respiratory distress.     Breath sounds: Normal breath sounds. No wheezing, rhonchi or rales.  Chest:  Breasts:     Right: No axillary adenopathy or supraclavicular adenopathy.     Left: No axillary adenopathy or supraclavicular adenopathy.    Abdominal:     General: Bowel sounds are normal. There is no distension.     Palpations: Abdomen is soft. There is no mass.     Tenderness: There is no abdominal tenderness.  Musculoskeletal:        General: No swelling.     Right lower leg: No edema.     Left lower leg: No edema.   Lymphadenopathy:     Cervical: No cervical adenopathy.     Upper Body:     Right upper body: No supraclavicular or axillary adenopathy.     Left upper body: No supraclavicular or axillary adenopathy.     Lower Body: No right inguinal adenopathy. No left inguinal adenopathy.  Skin:    General: Skin is warm.     Coloration: Skin is not jaundiced.     Findings: No lesion or rash.  Neurological:     General: No focal deficit present.     Mental Status: She is alert and oriented to person, place, and time. Mental status is at baseline.     Cranial Nerves: Cranial nerves are intact.  Psychiatric:        Mood and Affect: Mood normal.        Behavior: Behavior normal.        Thought Content: Thought content normal.    LABS:   CBC Latest Ref Rng & Units 05/23/2020  WBC - 10.2  Hemoglobin 12.0 - 16.0 12.0  Hematocrit 36 - 46 36  Platelets 150 - 399 440(A)   CMP Latest Ref Rng & Units 05/23/2020  BUN 4 - 21 9  Creatinine 0.5 - 1.1 0.8  Sodium 137 - 147 132(A)  Potassium 3.4 - 5.3 4.2  Chloride 99 - 108 95(A)  CO2 13 - 22 29(A)  Calcium 8.7 - 10.7 8.7  Alkaline Phos 25 - 125 90  AST 13 - 35 37(A)  ALT 7 - 35 24    ASSESSMENT & PLAN:  Assessment/Plan:  A 75 y.o. female with essential thrombocythemia.  Once again, her platelet count remains below 600,000 to where no type of cytoreductive therapy needs to be implemented.  She knows to continue taking a baby aspirin on a daily basis to offset any potential clotting complications from her underlying myeloproliferative disorder.  Otherwise, as she is doing very well, I will see her back in 6 months for repeat clinical assessment.  The patient understands all the plans discussed today and is in agreement with them.      Dequincy Macarthur Critchley, MD

## 2020-05-23 ENCOUNTER — Inpatient Hospital Stay (INDEPENDENT_AMBULATORY_CARE_PROVIDER_SITE_OTHER): Payer: PPO | Admitting: Oncology

## 2020-05-23 ENCOUNTER — Inpatient Hospital Stay: Payer: PPO | Attending: Oncology

## 2020-05-23 ENCOUNTER — Other Ambulatory Visit: Payer: Self-pay | Admitting: Hematology and Oncology

## 2020-05-23 ENCOUNTER — Other Ambulatory Visit: Payer: Self-pay | Admitting: Oncology

## 2020-05-23 ENCOUNTER — Other Ambulatory Visit: Payer: Self-pay

## 2020-05-23 ENCOUNTER — Telehealth: Payer: Self-pay | Admitting: Oncology

## 2020-05-23 VITALS — BP 140/67 | HR 73 | Temp 98.4°F | Resp 14 | Ht 67.0 in | Wt 191.4 lb

## 2020-05-23 DIAGNOSIS — D473 Essential (hemorrhagic) thrombocythemia: Secondary | ICD-10-CM

## 2020-05-23 DIAGNOSIS — M25661 Stiffness of right knee, not elsewhere classified: Secondary | ICD-10-CM | POA: Diagnosis not present

## 2020-05-23 DIAGNOSIS — R2689 Other abnormalities of gait and mobility: Secondary | ICD-10-CM | POA: Diagnosis not present

## 2020-05-23 DIAGNOSIS — R2681 Unsteadiness on feet: Secondary | ICD-10-CM | POA: Diagnosis not present

## 2020-05-23 DIAGNOSIS — Z96651 Presence of right artificial knee joint: Secondary | ICD-10-CM | POA: Diagnosis not present

## 2020-05-23 DIAGNOSIS — M25461 Effusion, right knee: Secondary | ICD-10-CM | POA: Diagnosis not present

## 2020-05-23 DIAGNOSIS — M25561 Pain in right knee: Secondary | ICD-10-CM | POA: Diagnosis not present

## 2020-05-23 DIAGNOSIS — R5383 Other fatigue: Secondary | ICD-10-CM | POA: Diagnosis not present

## 2020-05-23 DIAGNOSIS — M1711 Unilateral primary osteoarthritis, right knee: Secondary | ICD-10-CM | POA: Diagnosis not present

## 2020-05-23 DIAGNOSIS — M6281 Muscle weakness (generalized): Secondary | ICD-10-CM | POA: Diagnosis not present

## 2020-05-23 LAB — BASIC METABOLIC PANEL
BUN: 9 (ref 4–21)
CO2: 29 — AB (ref 13–22)
Chloride: 95 — AB (ref 99–108)
Creatinine: 0.8 (ref 0.5–1.1)
Glucose: 102
Potassium: 4.2 (ref 3.4–5.3)
Sodium: 132 — AB (ref 137–147)

## 2020-05-23 LAB — CBC AND DIFFERENTIAL
HCT: 36 (ref 36–46)
Hemoglobin: 12 (ref 12.0–16.0)
Neutrophils Absolute: 6.22
Platelets: 440 — AB (ref 150–399)
WBC: 10.2

## 2020-05-23 LAB — HEPATIC FUNCTION PANEL
ALT: 24 (ref 7–35)
AST: 37 — AB (ref 13–35)
Alkaline Phosphatase: 90 (ref 25–125)
Bilirubin, Total: 0.3

## 2020-05-23 LAB — COMPREHENSIVE METABOLIC PANEL
Albumin: 4.1 (ref 3.5–5.0)
Calcium: 8.7 (ref 8.7–10.7)

## 2020-05-23 LAB — CBC: RBC: 4.47 (ref 3.87–5.11)

## 2020-05-23 NOTE — Telephone Encounter (Signed)
Per 2/4 LOS, patient scheduled for August Appt's.  Gave patient Appt Summary 

## 2020-05-23 NOTE — Progress Notes (Signed)
t

## 2020-06-03 DIAGNOSIS — G4700 Insomnia, unspecified: Secondary | ICD-10-CM | POA: Diagnosis not present

## 2020-06-20 DIAGNOSIS — R5383 Other fatigue: Secondary | ICD-10-CM | POA: Diagnosis not present

## 2020-06-20 DIAGNOSIS — M1711 Unilateral primary osteoarthritis, right knee: Secondary | ICD-10-CM | POA: Diagnosis not present

## 2020-06-20 DIAGNOSIS — M25461 Effusion, right knee: Secondary | ICD-10-CM | POA: Diagnosis not present

## 2020-06-20 DIAGNOSIS — M25561 Pain in right knee: Secondary | ICD-10-CM | POA: Diagnosis not present

## 2020-06-20 DIAGNOSIS — Z96651 Presence of right artificial knee joint: Secondary | ICD-10-CM | POA: Diagnosis not present

## 2020-06-20 DIAGNOSIS — R2689 Other abnormalities of gait and mobility: Secondary | ICD-10-CM | POA: Diagnosis not present

## 2020-06-20 DIAGNOSIS — M25661 Stiffness of right knee, not elsewhere classified: Secondary | ICD-10-CM | POA: Diagnosis not present

## 2020-06-20 DIAGNOSIS — R2681 Unsteadiness on feet: Secondary | ICD-10-CM | POA: Diagnosis not present

## 2020-06-20 DIAGNOSIS — M6281 Muscle weakness (generalized): Secondary | ICD-10-CM | POA: Diagnosis not present

## 2020-07-01 DIAGNOSIS — J309 Allergic rhinitis, unspecified: Secondary | ICD-10-CM | POA: Diagnosis not present

## 2020-07-01 DIAGNOSIS — E039 Hypothyroidism, unspecified: Secondary | ICD-10-CM | POA: Diagnosis not present

## 2020-07-01 DIAGNOSIS — H68003 Unspecified Eustachian salpingitis, bilateral: Secondary | ICD-10-CM | POA: Diagnosis not present

## 2020-07-01 DIAGNOSIS — E871 Hypo-osmolality and hyponatremia: Secondary | ICD-10-CM | POA: Diagnosis not present

## 2020-07-01 DIAGNOSIS — H6092 Unspecified otitis externa, left ear: Secondary | ICD-10-CM | POA: Diagnosis not present

## 2020-07-01 DIAGNOSIS — Z8619 Personal history of other infectious and parasitic diseases: Secondary | ICD-10-CM | POA: Diagnosis not present

## 2020-07-01 DIAGNOSIS — J029 Acute pharyngitis, unspecified: Secondary | ICD-10-CM | POA: Diagnosis not present

## 2020-07-01 DIAGNOSIS — G737 Myopathy in diseases classified elsewhere: Secondary | ICD-10-CM | POA: Diagnosis not present

## 2020-07-10 DIAGNOSIS — Z96651 Presence of right artificial knee joint: Secondary | ICD-10-CM | POA: Diagnosis not present

## 2020-07-10 DIAGNOSIS — Z79899 Other long term (current) drug therapy: Secondary | ICD-10-CM | POA: Diagnosis not present

## 2020-07-10 DIAGNOSIS — L570 Actinic keratosis: Secondary | ICD-10-CM | POA: Diagnosis not present

## 2020-07-18 DIAGNOSIS — Z1231 Encounter for screening mammogram for malignant neoplasm of breast: Secondary | ICD-10-CM | POA: Diagnosis not present

## 2020-07-23 DIAGNOSIS — R2689 Other abnormalities of gait and mobility: Secondary | ICD-10-CM | POA: Diagnosis not present

## 2020-07-23 DIAGNOSIS — M25461 Effusion, right knee: Secondary | ICD-10-CM | POA: Diagnosis not present

## 2020-07-23 DIAGNOSIS — M1711 Unilateral primary osteoarthritis, right knee: Secondary | ICD-10-CM | POA: Diagnosis not present

## 2020-07-23 DIAGNOSIS — M25661 Stiffness of right knee, not elsewhere classified: Secondary | ICD-10-CM | POA: Diagnosis not present

## 2020-07-23 DIAGNOSIS — R2681 Unsteadiness on feet: Secondary | ICD-10-CM | POA: Diagnosis not present

## 2020-07-23 DIAGNOSIS — M6281 Muscle weakness (generalized): Secondary | ICD-10-CM | POA: Diagnosis not present

## 2020-07-23 DIAGNOSIS — Z96651 Presence of right artificial knee joint: Secondary | ICD-10-CM | POA: Diagnosis not present

## 2020-07-23 DIAGNOSIS — R5383 Other fatigue: Secondary | ICD-10-CM | POA: Diagnosis not present

## 2020-07-25 DIAGNOSIS — Z471 Aftercare following joint replacement surgery: Secondary | ICD-10-CM | POA: Diagnosis not present

## 2020-07-25 DIAGNOSIS — S8991XA Unspecified injury of right lower leg, initial encounter: Secondary | ICD-10-CM | POA: Diagnosis not present

## 2020-07-25 DIAGNOSIS — M25561 Pain in right knee: Secondary | ICD-10-CM | POA: Diagnosis not present

## 2020-07-25 DIAGNOSIS — M778 Other enthesopathies, not elsewhere classified: Secondary | ICD-10-CM | POA: Diagnosis not present

## 2020-07-25 DIAGNOSIS — Z96651 Presence of right artificial knee joint: Secondary | ICD-10-CM | POA: Diagnosis not present

## 2020-07-25 DIAGNOSIS — M65861 Other synovitis and tenosynovitis, right lower leg: Secondary | ICD-10-CM | POA: Diagnosis not present

## 2020-07-25 DIAGNOSIS — M65261 Calcific tendinitis, right lower leg: Secondary | ICD-10-CM | POA: Diagnosis not present

## 2020-07-29 DIAGNOSIS — M47814 Spondylosis without myelopathy or radiculopathy, thoracic region: Secondary | ICD-10-CM | POA: Diagnosis not present

## 2020-07-29 DIAGNOSIS — M533 Sacrococcygeal disorders, not elsewhere classified: Secondary | ICD-10-CM | POA: Diagnosis not present

## 2020-07-29 DIAGNOSIS — M797 Fibromyalgia: Secondary | ICD-10-CM | POA: Diagnosis not present

## 2020-07-29 DIAGNOSIS — M961 Postlaminectomy syndrome, not elsewhere classified: Secondary | ICD-10-CM | POA: Diagnosis not present

## 2020-08-05 DIAGNOSIS — M25561 Pain in right knee: Secondary | ICD-10-CM | POA: Diagnosis not present

## 2020-08-05 DIAGNOSIS — Z96651 Presence of right artificial knee joint: Secondary | ICD-10-CM | POA: Diagnosis not present

## 2020-08-19 DIAGNOSIS — Z96651 Presence of right artificial knee joint: Secondary | ICD-10-CM | POA: Diagnosis not present

## 2020-08-19 DIAGNOSIS — M25561 Pain in right knee: Secondary | ICD-10-CM | POA: Diagnosis not present

## 2020-08-20 ENCOUNTER — Other Ambulatory Visit: Payer: Self-pay

## 2020-08-28 ENCOUNTER — Other Ambulatory Visit: Payer: Self-pay

## 2020-08-28 ENCOUNTER — Ambulatory Visit: Payer: PPO | Admitting: Cardiology

## 2020-08-28 ENCOUNTER — Encounter: Payer: Self-pay | Admitting: Cardiology

## 2020-08-28 VITALS — BP 132/70 | HR 78 | Ht 67.0 in | Wt 197.8 lb

## 2020-08-28 DIAGNOSIS — I351 Nonrheumatic aortic (valve) insufficiency: Secondary | ICD-10-CM | POA: Diagnosis not present

## 2020-08-28 DIAGNOSIS — I10 Essential (primary) hypertension: Secondary | ICD-10-CM | POA: Diagnosis not present

## 2020-08-28 NOTE — Patient Instructions (Signed)

## 2020-08-28 NOTE — Progress Notes (Signed)
Cardiology Office Note:    Date:  08/28/2020   ID:  Lauren Roth, DOB 28-Jul-1945, MRN [id number]  PCP:  Nicholos Johns, MD  Cardiologist:  Jenean Lindau, MD   Referring MD: Nicholos Johns, MD    ASSESSMENT:    1. Moderate aortic regurgitation   2. Essential hypertension    PLAN:    In order of problems listed above:  1. Primary prevention stressed with the patient.  Importance of compliance with diet medication stressed and she vocalized understanding. 2. Essential hypertension: Blood pressure stable and diet was emphasized.  Lifestyle modification urged.  Recent blood work was reviewed and she has blood work coming up at the visit next month with her primary care. 3. Moderate aortic regurgitation: Stable at this time.  We will continue to monitor.  She will have an echocardiogram in the next follow-up appointment in 6 months. 4. Obesity: Diet emphasized.  Weight reduction was stressed.  Risks of obesity explained and she promises to do better.   Medication Adjustments/Labs and Tests Ordered: Current medicines are reviewed at length with the patient today.  Concerns regarding medicines are outlined above.  No orders of the defined types were placed in this encounter.  No orders of the defined types were placed in this encounter.    No chief complaint on file.    History of Present Illness:    Lauren Roth is a 75 y.o. female.  Patient has past medical history of essential hypertension and moderate aortic regurgitation.  She has significant issues with obesity.  She is undergoing knee replacement surgery with some complications of knee locking up.  Therefore she leads a sedentary lifestyle.  At the time of my evaluation, the patient is alert awake oriented and in no distress.  Past Medical History:  Diagnosis Date  . Allergic rhinitis 01/16/2009   Qualifier: Diagnosis of  By: Loanne Drilling MD, Jacelyn Pi   Formatting of this note might be different from the  original. Overview:  Qualifier: Diagnosis of  By: Loanne Drilling MD, Jacelyn Pi Arthritis   . DEGENERATIVE DISC DISEASE, LUMBOSACRAL SPINE 01/16/2009   Qualifier: Diagnosis of  By: Loanne Drilling MD, Jacelyn Pi   . Depression   . DEPRESSION 01/16/2009   Qualifier: Diagnosis of  By: Loanne Drilling MD, Jacelyn Pi   . DIAPHORESIS 01/16/2009   Qualifier: Diagnosis of  By: Loanne Drilling MD, Jacelyn Pi   . Diaphoresis 01/16/2009   Qualifier: Diagnosis of  By: Loanne Drilling MD, Roena Malady of this note might be different from the original. Overview:  Qualifier: Diagnosis of  By: Loanne Drilling MD, Jacelyn Pi Edema 01/16/2009   Qualifier: Diagnosis of  By: Loanne Drilling MD, Roena Malady of this note might be different from the original. Overview:  Qualifier: Diagnosis of  By: Loanne Drilling MD, Jacelyn Pi Encounter for therapeutic drug monitoring 08/25/2010  . Essential hypertension 01/28/2017  . Essential thrombocytosis ([place]) 11/25/2017  . Fibromyalgia   . GERD 01/16/2009   Qualifier: Diagnosis of  By: Loanne Drilling MD, Jacelyn Pi   . Hypertension   . HYPERTENSION 01/16/2009   Qualifier: Diagnosis of  By: Loanne Drilling MD, Jacelyn Pi   . Hypokalemia 01/16/2009   Qualifier: Diagnosis of  By: Loanne Drilling MD, Roena Malady of this note might be different from the original. Overview:  Qualifier: Diagnosis of  By: Loanne Drilling MD, Jacelyn Pi Left hip pain 05/24/2019  . Lumbosacral spondylosis 10/09/2010  . Memory  loss 01/16/2009   Qualifier: Diagnosis of  By: Loanne Drilling MD, Roena Malady of this note might be different from the original. Overview:  Qualifier: Diagnosis of  By: Loanne Drilling MD, Jacelyn Pi Menopausal and postmenopausal disorder 01/16/2009   Qualifier: Diagnosis of  By: Loanne Drilling MD, Jacelyn Pi   Formatting of this note might be different from the original. Overview:  Qualifier: Diagnosis of  By: Loanne Drilling MD, Jacelyn Pi Moderate aortic regurgitation 05/21/2019  . Neuropathy   . Pain in soft tissues of limb 06/20/2012   Formatting of this note might be different from the original. Overview:   IMPRESSION: Left knee pain Formatting of this note might be different from the original. IMPRESSION: Left knee pain  . Pain in thoracic spine 07/30/2013  . Palpitations 01/28/2017  . Pre-operative cardiovascular examination 11/25/2017  . Primary osteoarthritis of right knee 10/06/2014  . Restless legs syndrome 08/25/2010  . Shortness of breath on exertion 01/28/2017  . SI (sacroiliac) pain 05/10/2016  . Thoracic spondylosis 11/20/2010   Overview:  Overview:  IMPRESSION: Thoracic radiculopathy Overview:  IMPRESSION: Thoracic radiculopathy  Formatting of this note might be different from the original. Overview:  IMPRESSION: Thoracic radiculopathy Formatting of this note might be different from the original. IMPRESSION: Thoracic radiculopathy  . Thrombocythemia, essential ([place]) 09/12/2013  . Thyroid disease   . TMJ (dislocation of temporomandibular joint) 07/30/2013  . Unspecified hypothyroidism 01/16/2009   Qualifier: Diagnosis of  By: Loanne Drilling MD, Jacelyn Pi   . Uterine cancer ([place]) 07/30/2013    Past Surgical History:  Procedure Laterality Date  . APPENDECTOMY    . CERVICAL DISC SURGERY    . CHOLECYSTECTOMY    . FRACTURE SURGERY Left    Left arm has a metal plate  . KNEE SURGERY      Current Medications: Current Meds  Medication Sig  . amitriptyline (ELAVIL) 100 MG tablet Take 1 tablet by mouth daily.  . ARIPiprazole (ABILIFY) 2 MG tablet Take 2 mg by mouth daily.  Marland Kitchen aspirin EC 81 MG tablet Take 81 mg by mouth daily.  . BELSOMRA 10 MG TABS Take 10 mg by mouth at bedtime as needed for sleep.  Marland Kitchen buPROPion (WELLBUTRIN XL) 300 MG 24 hr tablet Take 1 tablet by mouth daily.  . cloNIDine (CATAPRES) 0.1 MG tablet Take 1 tablet by mouth 2 (two) times daily.  . diclofenac sodium (VOLTAREN) 1 % GEL Apply 1 % topically as needed for pain.  Marland Kitchen donepezil (ARICEPT) 10 MG tablet Take 10 mg by mouth daily.  . DULoxetine (CYMBALTA) 60 MG capsule Take 60 mg by mouth daily.  Marland Kitchen estrogens, conjugated, (PREMARIN)  0.625 MG tablet Take 1 tablet by mouth daily.  . fluticasone (FLONASE) 50 MCG/ACT nasal spray Place 1 spray into both nostrils daily as needed for allergies or rhinitis.  . furosemide (LASIX) 20 MG tablet Take 1 tablet by mouth daily.  Marland Kitchen levothyroxine (SYNTHROID, LEVOTHROID) 25 MCG tablet Take 1 tablet by mouth daily.  Marland Kitchen lisinopril-hydrochlorothiazide (PRINZIDE,ZESTORETIC) 20-12.5 MG tablet Take 1 tablet by mouth daily.  Marland Kitchen loratadine (CLARITIN) 10 MG tablet Take 10 mg by mouth daily.  Marland Kitchen morphine (MS CONTIN) 30 MG 12 hr tablet Take 30 mg by mouth every 8 (eight) hours as needed for pain.  . nitroGLYCERIN (NITROSTAT) 0.4 MG SL tablet Place 0.4 mg under the tongue as needed for chest pain.  Marland Kitchen omeprazole (PRILOSEC) 20 MG capsule Take 1 capsule by mouth daily.  . Oxycodone HCl 10  MG TABS Take 10 mg by mouth 4 (four) times daily as needed for pain.  . potassium chloride (K-DUR) 10 MEQ tablet Take 1 tablet by mouth daily.  Marland Kitchen rOPINIRole (REQUIP) 1 MG tablet Take 1 tablet by mouth daily.  Marland Kitchen tiZANidine (ZANAFLEX) 2 MG tablet Take 2 mg by mouth every 8 (eight) hours as needed for muscle spasms.  Marland Kitchen topiramate (TOPAMAX) 25 MG tablet Take 25 mg by mouth 2 (two) times daily.  . traZODone (DESYREL) 100 MG tablet Take 100 mg by mouth at bedtime.  . Vitamin D, Ergocalciferol, (DRISDOL) 1.25 MG (50000 UNIT) CAPS capsule Take 50,000 Units by mouth once a week.     Allergies:   Cottonseed oil and Codeine sulfate   Social History   Socioeconomic History  . Marital status: Married    Spouse name: Not on file  . Number of children: Not on file  . Years of education: Not on file  . Highest education level: Not on file  Occupational History  . Not on file  Tobacco Use  . Smoking status: Former Research scientist (life sciences)  . Smokeless tobacco: Never Used  . Tobacco comment: at age 89  Vaping Use  . Vaping Use: Never used  Substance and Sexual Activity  . Alcohol use: No  . Drug use: No  . Sexual activity: Not on file  Other  Topics Concern  . Not on file  Social History Narrative  . Not on file   Social Determinants of Health   Financial Resource Strain: Not on file  Food Insecurity: Not on file  Transportation Needs: Not on file  Physical Activity: Not on file  Stress: Not on file  Social Connections: Not on file     Family History: The patient's family history includes Deep vein thrombosis in her father; Heart disease in her brother, brother, and father; Heart failure in her father; Pulmonary embolism in her brother.  ROS:   Please see the history of present illness.    All other systems reviewed and are negative.  EKGs/Labs/Other Studies Reviewed:    The following studies were reviewed today: I discussed my findings with the patient at length.   Recent Labs: 05/23/2020: ALT 24; BUN 9; Creatinine 0.8; Hemoglobin 12.0; Platelets 440; Potassium 4.2; Sodium 132  Recent Lipid Panel No results found for: CHOL, TRIG, HDL, CHOLHDL, VLDL, LDLCALC, LDLDIRECT  Physical Exam:    VS:  BP 132/70   Pulse 78   Ht 5\' 7"  (1.702 m)   Wt 197 lb 12.8 oz (89.7 kg)   SpO2 97%   BMI 30.98 kg/m     Wt Readings from Last 3 Encounters:  08/28/20 197 lb 12.8 oz (89.7 kg)  05/23/20 191 lb 6.4 oz (86.8 kg)  02/28/20 189 lb (85.7 kg)     GEN: Patient is in no acute distress HEENT: Normal NECK: No JVD; No carotid bruits LYMPHATICS: No lymphadenopathy CARDIAC: Hear sounds regular, 2/6 systolic murmur at the apex. RESPIRATORY:  Clear to auscultation without rales, wheezing or rhonchi  ABDOMEN: Soft, non-tender, non-distended MUSCULOSKELETAL:  No edema; No deformity  SKIN: Warm and dry NEUROLOGIC:  Alert and oriented x 3 PSYCHIATRIC:  Normal affect   Signed, Jenean Lindau, MD  08/28/2020 3:22 PM    [facility]

## 2020-09-01 DIAGNOSIS — H26493 Other secondary cataract, bilateral: Secondary | ICD-10-CM | POA: Diagnosis not present

## 2020-09-01 DIAGNOSIS — H524 Presbyopia: Secondary | ICD-10-CM | POA: Diagnosis not present

## 2020-09-01 DIAGNOSIS — Z961 Presence of intraocular lens: Secondary | ICD-10-CM | POA: Diagnosis not present

## 2020-09-02 DIAGNOSIS — Z96651 Presence of right artificial knee joint: Secondary | ICD-10-CM | POA: Diagnosis not present

## 2020-09-10 DIAGNOSIS — Z9181 History of falling: Secondary | ICD-10-CM | POA: Diagnosis not present

## 2020-09-10 DIAGNOSIS — E785 Hyperlipidemia, unspecified: Secondary | ICD-10-CM | POA: Diagnosis not present

## 2020-09-10 DIAGNOSIS — Z1331 Encounter for screening for depression: Secondary | ICD-10-CM | POA: Diagnosis not present

## 2020-09-10 DIAGNOSIS — Z0000 Encounter for general adult medical examination without abnormal findings: Secondary | ICD-10-CM | POA: Diagnosis not present

## 2020-09-10 DIAGNOSIS — E669 Obesity, unspecified: Secondary | ICD-10-CM | POA: Diagnosis not present

## 2020-09-11 DIAGNOSIS — M6281 Muscle weakness (generalized): Secondary | ICD-10-CM | POA: Diagnosis not present

## 2020-09-11 DIAGNOSIS — M25461 Effusion, right knee: Secondary | ICD-10-CM | POA: Diagnosis not present

## 2020-09-11 DIAGNOSIS — M25661 Stiffness of right knee, not elsewhere classified: Secondary | ICD-10-CM | POA: Diagnosis not present

## 2020-09-11 DIAGNOSIS — M25561 Pain in right knee: Secondary | ICD-10-CM | POA: Diagnosis not present

## 2020-09-11 DIAGNOSIS — Z96651 Presence of right artificial knee joint: Secondary | ICD-10-CM | POA: Diagnosis not present

## 2020-09-11 DIAGNOSIS — R2689 Other abnormalities of gait and mobility: Secondary | ICD-10-CM | POA: Diagnosis not present

## 2020-09-11 DIAGNOSIS — R2681 Unsteadiness on feet: Secondary | ICD-10-CM | POA: Diagnosis not present

## 2020-09-17 DIAGNOSIS — R0602 Shortness of breath: Secondary | ICD-10-CM | POA: Diagnosis not present

## 2020-09-17 DIAGNOSIS — M7989 Other specified soft tissue disorders: Secondary | ICD-10-CM | POA: Diagnosis not present

## 2020-09-17 DIAGNOSIS — R600 Localized edema: Secondary | ICD-10-CM | POA: Diagnosis not present

## 2020-09-23 DIAGNOSIS — G4700 Insomnia, unspecified: Secondary | ICD-10-CM | POA: Diagnosis not present

## 2020-09-23 DIAGNOSIS — M545 Low back pain, unspecified: Secondary | ICD-10-CM | POA: Diagnosis not present

## 2020-09-23 DIAGNOSIS — Z09 Encounter for follow-up examination after completed treatment for conditions other than malignant neoplasm: Secondary | ICD-10-CM | POA: Diagnosis not present

## 2020-09-23 DIAGNOSIS — Z96651 Presence of right artificial knee joint: Secondary | ICD-10-CM | POA: Diagnosis not present

## 2020-09-23 DIAGNOSIS — Z6830 Body mass index (BMI) 30.0-30.9, adult: Secondary | ICD-10-CM | POA: Diagnosis not present

## 2020-09-23 DIAGNOSIS — G737 Myopathy in diseases classified elsewhere: Secondary | ICD-10-CM | POA: Diagnosis not present

## 2020-09-23 DIAGNOSIS — M25561 Pain in right knee: Secondary | ICD-10-CM | POA: Diagnosis not present

## 2020-09-23 DIAGNOSIS — E785 Hyperlipidemia, unspecified: Secondary | ICD-10-CM | POA: Diagnosis not present

## 2020-09-23 DIAGNOSIS — E559 Vitamin D deficiency, unspecified: Secondary | ICD-10-CM | POA: Diagnosis not present

## 2020-09-23 DIAGNOSIS — G8929 Other chronic pain: Secondary | ICD-10-CM | POA: Diagnosis not present

## 2020-09-23 DIAGNOSIS — I10 Essential (primary) hypertension: Secondary | ICD-10-CM | POA: Diagnosis not present

## 2020-09-23 DIAGNOSIS — R2681 Unsteadiness on feet: Secondary | ICD-10-CM | POA: Diagnosis not present

## 2020-09-23 DIAGNOSIS — E039 Hypothyroidism, unspecified: Secondary | ICD-10-CM | POA: Diagnosis not present

## 2020-09-23 DIAGNOSIS — M6281 Muscle weakness (generalized): Secondary | ICD-10-CM | POA: Diagnosis not present

## 2020-09-23 DIAGNOSIS — Z79899 Other long term (current) drug therapy: Secondary | ICD-10-CM | POA: Diagnosis not present

## 2020-09-23 DIAGNOSIS — M25661 Stiffness of right knee, not elsewhere classified: Secondary | ICD-10-CM | POA: Diagnosis not present

## 2020-09-23 DIAGNOSIS — M25461 Effusion, right knee: Secondary | ICD-10-CM | POA: Diagnosis not present

## 2020-09-23 DIAGNOSIS — J309 Allergic rhinitis, unspecified: Secondary | ICD-10-CM | POA: Diagnosis not present

## 2020-09-23 DIAGNOSIS — D75839 Thrombocytosis, unspecified: Secondary | ICD-10-CM | POA: Diagnosis not present

## 2020-09-25 DIAGNOSIS — Z79899 Other long term (current) drug therapy: Secondary | ICD-10-CM | POA: Diagnosis not present

## 2020-09-25 DIAGNOSIS — M961 Postlaminectomy syndrome, not elsewhere classified: Secondary | ICD-10-CM | POA: Diagnosis not present

## 2020-09-25 DIAGNOSIS — M792 Neuralgia and neuritis, unspecified: Secondary | ICD-10-CM

## 2020-09-25 DIAGNOSIS — M546 Pain in thoracic spine: Secondary | ICD-10-CM | POA: Diagnosis not present

## 2020-09-25 HISTORY — DX: Neuralgia and neuritis, unspecified: M79.2

## 2020-09-30 DIAGNOSIS — R29898 Other symptoms and signs involving the musculoskeletal system: Secondary | ICD-10-CM | POA: Diagnosis not present

## 2020-09-30 DIAGNOSIS — Z96651 Presence of right artificial knee joint: Secondary | ICD-10-CM | POA: Diagnosis not present

## 2020-10-14 DIAGNOSIS — M40204 Unspecified kyphosis, thoracic region: Secondary | ICD-10-CM | POA: Diagnosis not present

## 2020-10-14 DIAGNOSIS — M961 Postlaminectomy syndrome, not elsewhere classified: Secondary | ICD-10-CM | POA: Diagnosis not present

## 2020-10-14 DIAGNOSIS — R079 Chest pain, unspecified: Secondary | ICD-10-CM | POA: Diagnosis not present

## 2020-10-14 DIAGNOSIS — M792 Neuralgia and neuritis, unspecified: Secondary | ICD-10-CM | POA: Diagnosis not present

## 2020-10-14 DIAGNOSIS — M40294 Other kyphosis, thoracic region: Secondary | ICD-10-CM | POA: Diagnosis not present

## 2020-10-14 DIAGNOSIS — M47814 Spondylosis without myelopathy or radiculopathy, thoracic region: Secondary | ICD-10-CM | POA: Diagnosis not present

## 2020-10-17 DIAGNOSIS — K5900 Constipation, unspecified: Secondary | ICD-10-CM | POA: Diagnosis not present

## 2020-10-17 DIAGNOSIS — R109 Unspecified abdominal pain: Secondary | ICD-10-CM | POA: Diagnosis not present

## 2020-10-17 DIAGNOSIS — E7800 Pure hypercholesterolemia, unspecified: Secondary | ICD-10-CM | POA: Diagnosis not present

## 2020-10-17 DIAGNOSIS — M1990 Unspecified osteoarthritis, unspecified site: Secondary | ICD-10-CM | POA: Diagnosis not present

## 2020-10-17 DIAGNOSIS — I10 Essential (primary) hypertension: Secondary | ICD-10-CM | POA: Diagnosis not present

## 2020-10-17 DIAGNOSIS — Z79899 Other long term (current) drug therapy: Secondary | ICD-10-CM | POA: Diagnosis not present

## 2020-10-17 DIAGNOSIS — E039 Hypothyroidism, unspecified: Secondary | ICD-10-CM | POA: Diagnosis not present

## 2020-10-17 DIAGNOSIS — R1032 Left lower quadrant pain: Secondary | ICD-10-CM | POA: Diagnosis not present

## 2020-10-17 DIAGNOSIS — K529 Noninfective gastroenteritis and colitis, unspecified: Secondary | ICD-10-CM | POA: Diagnosis not present

## 2020-10-17 DIAGNOSIS — R1030 Lower abdominal pain, unspecified: Secondary | ICD-10-CM | POA: Diagnosis not present

## 2020-10-21 DIAGNOSIS — M5134 Other intervertebral disc degeneration, thoracic region: Secondary | ICD-10-CM | POA: Diagnosis not present

## 2020-10-21 DIAGNOSIS — M5414 Radiculopathy, thoracic region: Secondary | ICD-10-CM | POA: Diagnosis not present

## 2020-10-21 DIAGNOSIS — M4724 Other spondylosis with radiculopathy, thoracic region: Secondary | ICD-10-CM | POA: Diagnosis not present

## 2020-10-21 DIAGNOSIS — M47814 Spondylosis without myelopathy or radiculopathy, thoracic region: Secondary | ICD-10-CM | POA: Diagnosis not present

## 2020-11-06 DIAGNOSIS — Z09 Encounter for follow-up examination after completed treatment for conditions other than malignant neoplasm: Secondary | ICD-10-CM | POA: Diagnosis not present

## 2020-11-06 DIAGNOSIS — I10 Essential (primary) hypertension: Secondary | ICD-10-CM | POA: Diagnosis not present

## 2020-11-06 DIAGNOSIS — Z8719 Personal history of other diseases of the digestive system: Secondary | ICD-10-CM | POA: Diagnosis not present

## 2020-11-06 DIAGNOSIS — R0989 Other specified symptoms and signs involving the circulatory and respiratory systems: Secondary | ICD-10-CM | POA: Diagnosis not present

## 2020-11-10 DIAGNOSIS — N390 Urinary tract infection, site not specified: Secondary | ICD-10-CM | POA: Diagnosis not present

## 2020-11-10 DIAGNOSIS — I10 Essential (primary) hypertension: Secondary | ICD-10-CM | POA: Diagnosis not present

## 2020-11-10 DIAGNOSIS — R1032 Left lower quadrant pain: Secondary | ICD-10-CM | POA: Diagnosis not present

## 2020-11-10 DIAGNOSIS — R1031 Right lower quadrant pain: Secondary | ICD-10-CM | POA: Diagnosis not present

## 2020-11-10 DIAGNOSIS — N3281 Overactive bladder: Secondary | ICD-10-CM | POA: Diagnosis not present

## 2020-11-11 DIAGNOSIS — M546 Pain in thoracic spine: Secondary | ICD-10-CM | POA: Diagnosis not present

## 2020-11-11 DIAGNOSIS — M961 Postlaminectomy syndrome, not elsewhere classified: Secondary | ICD-10-CM | POA: Diagnosis not present

## 2020-11-11 DIAGNOSIS — M533 Sacrococcygeal disorders, not elsewhere classified: Secondary | ICD-10-CM | POA: Diagnosis not present

## 2020-11-14 NOTE — Progress Notes (Signed)
[facility]  [address] [place],    [address] [phone]  Clinic Day:  11/27/2020  Referring physician: Nicholos Johns, MD  This document serves as a record of services personally performed by Marice Potter, MD. It was created on their behalf by [facility], a trained medical scribe. The creation of this record is based on the scribe's personal observations and the provider's statements to them.  HISTORY OF PRESENT ILLNESS:  The patient is a 75 y.o. female with essential thrombocythemia.  As her platelet count has never risen above 600,000, her disease has been followed conservatively.  She comes in today for routine follow up.  Since her last visit, the patient has been doing well.  She denies having any clotting complications as it pertains to her underlying myeloproliferative disorder.  PHYSICAL EXAM:  Blood pressure (!) 151/69, pulse (!) 55, temperature 97.9 F (36.6 C), resp. rate 16, height '5\' 7"'$  (1.702 m), weight 196 lb 3.2 oz (89 kg), SpO2 97 %. Wt Readings from Last 3 Encounters:  11/27/20 196 lb 3.2 oz (89 kg)  08/28/20 197 lb 12.8 oz (89.7 kg)  05/23/20 191 lb 6.4 oz (86.8 kg)   Body mass index is 30.73 kg/m. Performance status (ECOG): 1 Physical Exam Constitutional:      Appearance: Normal appearance. She is not ill-appearing.     Comments: She is ambulating with a cane  HENT:     Mouth/Throat:     Mouth: Mucous membranes are moist.     Pharynx: Oropharynx is clear. No oropharyngeal exudate or posterior oropharyngeal erythema.  Cardiovascular:     Rate and Rhythm: Normal rate and regular rhythm.     Heart sounds: No murmur heard.   No friction rub. No gallop.  Pulmonary:     Effort: Pulmonary effort is normal. No respiratory distress.     Breath sounds: Normal breath sounds. No wheezing, rhonchi or rales.  Abdominal:     General: Bowel sounds are normal. There is no distension.     Palpations: Abdomen is soft.  There is no mass.     Tenderness: There is no abdominal tenderness.  Musculoskeletal:        General: No swelling.     Right lower leg: No edema.     Left lower leg: No edema.  Lymphadenopathy:     Cervical: No cervical adenopathy.     Upper Body:     Right upper body: No supraclavicular or axillary adenopathy.     Left upper body: No supraclavicular or axillary adenopathy.     Lower Body: No right inguinal adenopathy. No left inguinal adenopathy.  Skin:    General: Skin is warm.     Coloration: Skin is not jaundiced.     Findings: No lesion or rash.  Neurological:     General: No focal deficit present.     Mental Status: She is alert and oriented to person, place, and time. Mental status is at baseline.     Cranial Nerves: Cranial nerves are intact.  Psychiatric:        Mood and Affect: Mood normal.        Behavior: Behavior normal.        Thought Content: Thought content normal.   LABS:   CBC Latest Ref Rng & Units 11/27/2020 05/23/2020  WBC - 9.7 10.2  Hemoglobin 12.0 - 16.0 12.1 12.0  Hematocrit 36 - 46 37 36  Platelets 150 - 399 462(A) 440(A)  ASSESSMENT & PLAN:  Assessment/Plan:  A 75 y.o. female with essential thrombocythemia.  Once again, her platelet count remains well below 600,000 to where no type of cytoreductive therapy needs to be considered.  She knows to continue taking a baby aspirin on a daily basis to offset any potential clotting complications from her underlying myeloproliferative disorder.  Otherwise, as she is doing very well, I will see her back in 6 months for repeat clinical assessment.  The patient understands all the plans discussed today and is in agreement with them.     I, Rita Ohara, am acting as scribe for Marice Potter, MD    I have reviewed this report as typed by the medical scribe, and it is complete and accurate.  Adeli Frost Macarthur Critchley, MD

## 2020-11-18 DIAGNOSIS — M7989 Other specified soft tissue disorders: Secondary | ICD-10-CM | POA: Diagnosis not present

## 2020-11-18 DIAGNOSIS — I10 Essential (primary) hypertension: Secondary | ICD-10-CM | POA: Diagnosis not present

## 2020-11-18 DIAGNOSIS — Z7409 Other reduced mobility: Secondary | ICD-10-CM | POA: Diagnosis not present

## 2020-11-18 DIAGNOSIS — R42 Dizziness and giddiness: Secondary | ICD-10-CM | POA: Diagnosis not present

## 2020-11-18 DIAGNOSIS — R413 Other amnesia: Secondary | ICD-10-CM | POA: Diagnosis not present

## 2020-11-18 DIAGNOSIS — R499 Unspecified voice and resonance disorder: Secondary | ICD-10-CM | POA: Diagnosis not present

## 2020-11-20 ENCOUNTER — Ambulatory Visit: Payer: PPO | Admitting: Oncology

## 2020-11-20 ENCOUNTER — Other Ambulatory Visit: Payer: PPO

## 2020-11-27 ENCOUNTER — Telehealth: Payer: Self-pay | Admitting: Oncology

## 2020-11-27 ENCOUNTER — Other Ambulatory Visit: Payer: Self-pay | Admitting: Oncology

## 2020-11-27 ENCOUNTER — Other Ambulatory Visit: Payer: Self-pay

## 2020-11-27 ENCOUNTER — Inpatient Hospital Stay (INDEPENDENT_AMBULATORY_CARE_PROVIDER_SITE_OTHER): Payer: PPO | Admitting: Oncology

## 2020-11-27 ENCOUNTER — Encounter: Payer: Self-pay | Admitting: Oncology

## 2020-11-27 ENCOUNTER — Inpatient Hospital Stay: Payer: PPO | Attending: Oncology

## 2020-11-27 VITALS — BP 151/69 | HR 55 | Temp 97.9°F | Resp 16 | Ht 67.0 in | Wt 196.2 lb

## 2020-11-27 DIAGNOSIS — D473 Essential (hemorrhagic) thrombocythemia: Secondary | ICD-10-CM | POA: Diagnosis not present

## 2020-11-27 LAB — CBC AND DIFFERENTIAL
HCT: 37 (ref 36–46)
Hemoglobin: 12.1 (ref 12.0–16.0)
Neutrophils Absolute: 5.53
Platelets: 462 — AB (ref 150–399)
WBC: 9.7

## 2020-11-27 LAB — CBC: RBC: 4.37 (ref 3.87–5.11)

## 2020-11-27 NOTE — Telephone Encounter (Signed)
Per 8/11 LOS, patient scheduled for Feb 2023 Appts.  Gave patient Appt Summary

## 2020-12-04 DIAGNOSIS — K117 Disturbances of salivary secretion: Secondary | ICD-10-CM | POA: Diagnosis not present

## 2020-12-04 DIAGNOSIS — E039 Hypothyroidism, unspecified: Secondary | ICD-10-CM | POA: Diagnosis not present

## 2020-12-04 DIAGNOSIS — G8929 Other chronic pain: Secondary | ICD-10-CM | POA: Diagnosis not present

## 2020-12-04 DIAGNOSIS — J3489 Other specified disorders of nose and nasal sinuses: Secondary | ICD-10-CM | POA: Diagnosis not present

## 2020-12-04 DIAGNOSIS — J312 Chronic pharyngitis: Secondary | ICD-10-CM | POA: Diagnosis not present

## 2020-12-04 DIAGNOSIS — R0989 Other specified symptoms and signs involving the circulatory and respiratory systems: Secondary | ICD-10-CM | POA: Diagnosis not present

## 2020-12-04 DIAGNOSIS — K219 Gastro-esophageal reflux disease without esophagitis: Secondary | ICD-10-CM | POA: Diagnosis not present

## 2020-12-04 DIAGNOSIS — J310 Chronic rhinitis: Secondary | ICD-10-CM | POA: Diagnosis not present

## 2020-12-04 DIAGNOSIS — I10 Essential (primary) hypertension: Secondary | ICD-10-CM | POA: Diagnosis not present

## 2020-12-04 DIAGNOSIS — E669 Obesity, unspecified: Secondary | ICD-10-CM | POA: Diagnosis not present

## 2020-12-04 DIAGNOSIS — J342 Deviated nasal septum: Secondary | ICD-10-CM | POA: Diagnosis not present

## 2020-12-16 DIAGNOSIS — R600 Localized edema: Secondary | ICD-10-CM | POA: Diagnosis not present

## 2020-12-16 DIAGNOSIS — R300 Dysuria: Secondary | ICD-10-CM | POA: Diagnosis not present

## 2020-12-16 DIAGNOSIS — M7989 Other specified soft tissue disorders: Secondary | ICD-10-CM | POA: Diagnosis not present

## 2020-12-23 DIAGNOSIS — G894 Chronic pain syndrome: Secondary | ICD-10-CM | POA: Diagnosis not present

## 2020-12-23 DIAGNOSIS — M533 Sacrococcygeal disorders, not elsewhere classified: Secondary | ICD-10-CM | POA: Diagnosis not present

## 2020-12-23 DIAGNOSIS — M7989 Other specified soft tissue disorders: Secondary | ICD-10-CM | POA: Diagnosis not present

## 2020-12-23 DIAGNOSIS — E559 Vitamin D deficiency, unspecified: Secondary | ICD-10-CM | POA: Diagnosis not present

## 2020-12-23 DIAGNOSIS — F419 Anxiety disorder, unspecified: Secondary | ICD-10-CM | POA: Diagnosis not present

## 2020-12-23 DIAGNOSIS — M5414 Radiculopathy, thoracic region: Secondary | ICD-10-CM | POA: Diagnosis not present

## 2020-12-23 DIAGNOSIS — M961 Postlaminectomy syndrome, not elsewhere classified: Secondary | ICD-10-CM | POA: Diagnosis not present

## 2020-12-23 DIAGNOSIS — F331 Major depressive disorder, recurrent, moderate: Secondary | ICD-10-CM | POA: Diagnosis not present

## 2020-12-30 DIAGNOSIS — Z96651 Presence of right artificial knee joint: Secondary | ICD-10-CM | POA: Diagnosis not present

## 2020-12-30 DIAGNOSIS — M25561 Pain in right knee: Secondary | ICD-10-CM | POA: Diagnosis not present

## 2020-12-31 DIAGNOSIS — J309 Allergic rhinitis, unspecified: Secondary | ICD-10-CM | POA: Diagnosis not present

## 2020-12-31 DIAGNOSIS — G4700 Insomnia, unspecified: Secondary | ICD-10-CM | POA: Diagnosis not present

## 2020-12-31 DIAGNOSIS — E785 Hyperlipidemia, unspecified: Secondary | ICD-10-CM | POA: Diagnosis not present

## 2020-12-31 DIAGNOSIS — N760 Acute vaginitis: Secondary | ICD-10-CM | POA: Diagnosis not present

## 2020-12-31 DIAGNOSIS — F331 Major depressive disorder, recurrent, moderate: Secondary | ICD-10-CM | POA: Diagnosis not present

## 2020-12-31 DIAGNOSIS — G737 Myopathy in diseases classified elsewhere: Secondary | ICD-10-CM | POA: Diagnosis not present

## 2020-12-31 DIAGNOSIS — F419 Anxiety disorder, unspecified: Secondary | ICD-10-CM | POA: Diagnosis not present

## 2020-12-31 DIAGNOSIS — E039 Hypothyroidism, unspecified: Secondary | ICD-10-CM | POA: Diagnosis not present

## 2021-01-07 DIAGNOSIS — M797 Fibromyalgia: Secondary | ICD-10-CM | POA: Diagnosis not present

## 2021-01-13 ENCOUNTER — Telehealth: Payer: Self-pay | Admitting: Cardiology

## 2021-01-13 NOTE — Telephone Encounter (Signed)
Pt c/o BP issue: STAT if pt c/o blurred vision, one-sided weakness or slurred speech  1. What are your last 5 BP readings?  Patient states she is not keeping a log of her readings, but her systolic has been ranging 170-190  2. Are you having any other symptoms (ex. Dizziness, headache, blurred vision, passed out)?  No   3. What is your BP issue?   Patient states she her systolic # has been elevated.

## 2021-01-13 NOTE — Telephone Encounter (Signed)
Left message for patient to return call.

## 2021-01-13 NOTE — Telephone Encounter (Signed)
Left message for pt to call back  °

## 2021-01-13 NOTE — Addendum Note (Signed)
Addended by: Truddie Hidden on: 01/13/2021 04:34 PM   Modules accepted: Orders

## 2021-01-13 NOTE — Telephone Encounter (Signed)
Spoke with pt who states that her BP has been running in the 170's. Pt states that Dr. Rica Records recently changed her medication due to BP being increased but it has not helped. Pt does not have any consistent readings and will keep a log until Friday checking her BP 1-2 hours after her medications. Pt is unable to give me an updated list so I will call [facility] for list. Medication list has now been updated. Will call pt back on Friday for BP log.

## 2021-01-13 NOTE — Telephone Encounter (Signed)
Left message fpr pt to return our call.

## 2021-01-16 NOTE — Telephone Encounter (Signed)
01/13/21 pm 2 hours after medication 188/98 01/14/21 am 156/80 01/14/21 pm 185/78 01/15/21 am 176/82 01/15/21 pm 188/86 01/16/21 am 174/77  Pt states that her BP recently increased and she is unsure of the change. Pt states that she saw Dr. Rica Records prior to going to the beach and she changed her medication but it has not improved. How do you advise?

## 2021-01-16 NOTE — Telephone Encounter (Signed)
Patient calling back to follow up.

## 2021-01-19 NOTE — Telephone Encounter (Signed)
Pt c/o BP issue: STAT if pt c/o blurred vision, one-sided weakness or slurred speech  1. What are your last 5 BP readings? 188/98, 156/80,185/78, 176/82, 188/86, 174/77, 182/79, 164/74, last night 191/86, 155/81 this morning  2. Are you having any other symptoms (ex. Dizziness, headache, blurred vision, passed out)? no  3. What is your BP issue? Patient calling back to follow up.

## 2021-01-20 DIAGNOSIS — G894 Chronic pain syndrome: Secondary | ICD-10-CM | POA: Diagnosis not present

## 2021-01-20 DIAGNOSIS — M961 Postlaminectomy syndrome, not elsewhere classified: Secondary | ICD-10-CM | POA: Diagnosis not present

## 2021-01-20 DIAGNOSIS — M5414 Radiculopathy, thoracic region: Secondary | ICD-10-CM | POA: Diagnosis not present

## 2021-01-20 MED ORDER — AMLODIPINE BESYLATE 5 MG PO TABS: 5.0000 mg | ORAL_TABLET | Freq: Every day | ORAL | 3 refills | Status: DC

## 2021-01-20 NOTE — Addendum Note (Signed)
Addended by: Truddie Hidden on: 01/20/2021 10:58 AM   Modules accepted: Orders

## 2021-01-20 NOTE — Telephone Encounter (Signed)
Recommendations reviewed with pt as per Dr. Revankar's note.  Pt verbalized understanding and had no additional questions.   

## 2021-01-21 NOTE — Telephone Encounter (Signed)
Pt c/o medication issue:  1. Name of Medication:  amLODipine (NORVASC) 5 MG tablet  2. How are you currently taking this medication (dosage and times per day)? Not currently taking   3. Are you having a reaction (difficulty breathing--STAT)? No   4. What is your medication issue? Wants to know what time of day she should take the medication

## 2021-01-21 NOTE — Telephone Encounter (Signed)
Spoke to the patient just now and told her to take this medication in the morning but to make sure that she is taking it at the same time every morning.  She verbalizes understanding.   Encouraged patient to call back with any questions or concerns.

## 2021-01-22 DIAGNOSIS — I10 Essential (primary) hypertension: Secondary | ICD-10-CM | POA: Diagnosis not present

## 2021-01-22 DIAGNOSIS — L603 Nail dystrophy: Secondary | ICD-10-CM | POA: Diagnosis not present

## 2021-01-22 DIAGNOSIS — L659 Nonscarring hair loss, unspecified: Secondary | ICD-10-CM | POA: Diagnosis not present

## 2021-01-22 DIAGNOSIS — Z6830 Body mass index (BMI) 30.0-30.9, adult: Secondary | ICD-10-CM | POA: Diagnosis not present

## 2021-01-25 ENCOUNTER — Other Ambulatory Visit: Payer: Self-pay

## 2021-01-25 DIAGNOSIS — M7989 Other specified soft tissue disorders: Secondary | ICD-10-CM

## 2021-01-30 ENCOUNTER — Telehealth: Payer: Self-pay | Admitting: Cardiology

## 2021-01-30 MED ORDER — AMLODIPINE BESYLATE 10 MG PO TABS: 10.0000 mg | ORAL_TABLET | Freq: Every day | ORAL | 3 refills | Status: DC

## 2021-01-30 NOTE — Addendum Note (Signed)
Addended by: Resa Miner I on: 01/30/2021 04:01 PM   Modules accepted: Orders

## 2021-01-30 NOTE — Telephone Encounter (Signed)
Pt c/o BP issue: STAT if pt c/o blurred vision, one-sided weakness or slurred speech  1. What are your last 5 BP readings?  157/73  161/79  152/77  143/75  144/68  2. Are you having any other symptoms (ex. Dizziness, headache, blurred vision, passed out)?  Patient states she is asymptomatic  3. What is your BP issue?  Patient states her BP has still been elevated. She would like to know if Dr. Geraldo Pitter has any recommendations.

## 2021-01-30 NOTE — Telephone Encounter (Signed)
Spoke to the patient just now. She tells me that these readings are about two hours after she takes her medications in the morning. She is also making sure that she has been resting for at least 15 minutes prior to taking it. She does not have any symptoms but is concerned that the numbers are still high. She is unsure of her heart rate at this time and is not in a place where she can check it for me.

## 2021-01-30 NOTE — Telephone Encounter (Signed)
Spoke with the patient just now and let her know Dr. Julien Nordmann recommendations. She verbalizes understanding and thanks me for the call back.

## 2021-02-05 DIAGNOSIS — Z96651 Presence of right artificial knee joint: Secondary | ICD-10-CM | POA: Diagnosis not present

## 2021-02-05 DIAGNOSIS — M25561 Pain in right knee: Secondary | ICD-10-CM | POA: Diagnosis not present

## 2021-02-06 DIAGNOSIS — M7989 Other specified soft tissue disorders: Secondary | ICD-10-CM | POA: Diagnosis not present

## 2021-02-06 DIAGNOSIS — L03116 Cellulitis of left lower limb: Secondary | ICD-10-CM | POA: Diagnosis not present

## 2021-02-06 DIAGNOSIS — L03115 Cellulitis of right lower limb: Secondary | ICD-10-CM | POA: Diagnosis not present

## 2021-02-06 DIAGNOSIS — H68003 Unspecified Eustachian salpingitis, bilateral: Secondary | ICD-10-CM | POA: Diagnosis not present

## 2021-02-06 DIAGNOSIS — Z8619 Personal history of other infectious and parasitic diseases: Secondary | ICD-10-CM | POA: Diagnosis not present

## 2021-02-06 DIAGNOSIS — J309 Allergic rhinitis, unspecified: Secondary | ICD-10-CM | POA: Diagnosis not present

## 2021-02-06 DIAGNOSIS — J029 Acute pharyngitis, unspecified: Secondary | ICD-10-CM | POA: Diagnosis not present

## 2021-02-09 DIAGNOSIS — J309 Allergic rhinitis, unspecified: Secondary | ICD-10-CM | POA: Diagnosis not present

## 2021-02-10 ENCOUNTER — Other Ambulatory Visit: Payer: Self-pay

## 2021-02-10 ENCOUNTER — Ambulatory Visit (HOSPITAL_COMMUNITY)
Admission: RE | Admit: 2021-02-10 | Discharge: 2021-02-10 | Disposition: A | Discharge status: Home / Self Care | Payer: PPO | Source: Ambulatory Visit | Attending: Vascular Surgery | Admitting: Vascular Surgery

## 2021-02-10 ENCOUNTER — Encounter: Payer: Self-pay | Admitting: Vascular Surgery

## 2021-02-10 ENCOUNTER — Ambulatory Visit: Payer: PPO | Admitting: Vascular Surgery

## 2021-02-10 DIAGNOSIS — I890 Lymphedema, not elsewhere classified: Secondary | ICD-10-CM

## 2021-02-10 DIAGNOSIS — M7989 Other specified soft tissue disorders: Secondary | ICD-10-CM

## 2021-02-10 HISTORY — DX: Lymphedema, not elsewhere classified: I89.0

## 2021-02-10 NOTE — Progress Notes (Addendum)
Patient name: Lauren Roth MRN: [id number] DOB: 06-10-1945 Sex: female  REASON FOR CONSULT: Evaluate chronic lower extremity swelling  HPI: Lauren Roth is a 75 y.o. female, with history of hypertension, fibromyalgia, arthritis, chronic pain that presents for evaluation of lower extremity swelling.  She states this has been ongoing for the last year.  She thinks she had a remote DVT following knee surgery years ago with no recurrent clotting events.  She has profound swelling in both legs.  She has tried to wear compression stockings but finds these uncomfortable and has not been adherent to wearing them.  She has also been treated for lower extremity cellulitis  Past Medical History:  Diagnosis Date   Allergic rhinitis 01/16/2009   Qualifier: Diagnosis of  By: Loanne Drilling MD, Jacelyn Pi   Formatting of this note might be different from the original. Overview:  Qualifier: Diagnosis of  By: Loanne Drilling MD, Sean A   Arthritis    DEGENERATIVE [place] DISEASE, LUMBOSACRAL SPINE 01/16/2009   Qualifier: Diagnosis of  By: Loanne Drilling MD, Sean A    Depression    DEPRESSION 01/16/2009   Qualifier: Diagnosis of  By: Loanne Drilling MD, [place]  A    DIAPHORESIS 01/16/2009   Qualifier: Diagnosis of  By: Loanne Drilling MD, [place]  A    Diaphoresis 01/16/2009   Qualifier: Diagnosis of  By: Loanne Drilling MD, Jacelyn Pi  Formatting of this note might be different from the original. Overview:  Qualifier: Diagnosis of  By: Loanne Drilling MD, Sean A   Edema 01/16/2009   Qualifier: Diagnosis of  By: Loanne Drilling MD, Jacelyn Pi   Formatting of this note might be different from the original. Overview:  Qualifier: Diagnosis of  By: Loanne Drilling MD, Jacelyn Pi   Encounter for therapeutic drug monitoring 08/25/2010   Essential hypertension 01/28/2017   Essential thrombocytosis ([place]) 11/25/2017   Fibromyalgia    GERD 01/16/2009   Qualifier: Diagnosis of  By: Loanne Drilling MD, Sean A    Hypertension    HYPERTENSION 01/16/2009   Qualifier: Diagnosis of  By: Loanne Drilling MD, Sean  A    Hypokalemia 01/16/2009   Qualifier: Diagnosis of  By: Loanne Drilling MD, Jacelyn Pi   Formatting of this note might be different from the original. Overview:  Qualifier: Diagnosis of  By: Loanne Drilling MD, Sean A   Left hip pain 05/24/2019   Lumbosacral spondylosis 10/09/2010   Memory loss 01/16/2009   Qualifier: Diagnosis of  By: Loanne Drilling MD, Jacelyn Pi   Formatting of this note might be different from the original. Overview:  Qualifier: Diagnosis of  By: Loanne Drilling MD, Sean A   Menopausal and postmenopausal disorder 01/16/2009   Qualifier: Diagnosis of  By: Loanne Drilling MD, Jacelyn Pi   Formatting of this note might be different from the original. Overview:  Qualifier: Diagnosis of  By: Loanne Drilling MD, Sean A   Moderate aortic regurgitation 05/21/2019   Neuropathy    Pain in soft tissues of limb 06/20/2012   Formatting of this note might be different from the original. Overview:  IMPRESSION: Left knee pain Formatting of this note might be different from the original. IMPRESSION: Left knee pain   Pain in thoracic spine 07/30/2013   Palpitations 01/28/2017   Pre-operative cardiovascular examination 11/25/2017   Primary osteoarthritis of right knee 10/06/2014   Restless legs syndrome 08/25/2010   Shortness of breath on exertion 01/28/2017   SI (sacroiliac) pain 05/10/2016   Thoracic spondylosis 11/20/2010   Overview:  Overview:  IMPRESSION: Thoracic radiculopathy Overview:  IMPRESSION:  Thoracic radiculopathy  Formatting of this note might be different from the original. Overview:  IMPRESSION: Thoracic radiculopathy Formatting of this note might be different from the original. IMPRESSION: Thoracic radiculopathy   Thrombocythemia, essential ([place]) 09/12/2013   Thyroid disease    TMJ (dislocation of temporomandibular joint) 07/30/2013   Unspecified hypothyroidism 01/16/2009   Qualifier: Diagnosis of  By: Loanne Drilling MD, Sean A    Uterine cancer ([place]) 07/30/2013    Past Surgical History:  Procedure Laterality Date   APPENDECTOMY     CERVICAL DISC  SURGERY     CHOLECYSTECTOMY     FRACTURE SURGERY Left    Left arm has a metal plate   KNEE SURGERY      Family History  Problem Relation Age of Onset   Deep vein thrombosis Father    Heart disease Father    Heart failure Father    Heart disease Brother    Pulmonary embolism Brother    Heart disease Brother     SOCIAL HISTORY: Social History   Socioeconomic History   Marital status: Married    Spouse name: Not on file   Number of children: Not on file   Years of education: Not on file   Highest education level: Not on file  Occupational History   Not on file  Tobacco Use   Smoking status: Former   Smokeless tobacco: Never   Tobacco comments:    at age 54  Vaping Use   Vaping Use: Never used  Substance and Sexual Activity   Alcohol use: No   Drug use: No   Sexual activity: Not on file  Other Topics Concern   Not on file  Social History Narrative   Not on file   Social Determinants of Health   Financial Resource Strain: Not on file  Food Insecurity: Not on file  Transportation Needs: Not on file  Physical Activity: Not on file  Stress: Not on file  Social Connections: Not on file  Intimate Partner Violence: Not on file    Allergies  Allergen Reactions   Cottonseed Oil Hives and Itching    Raw Cotton Raw Cotton Raw Cotton Other reaction(s): Hives, Itching Raw Cotton Raw Cotton   Codeine Sulfate     Current Outpatient Medications  Medication Sig Dispense Refill   amitriptyline (ELAVIL) 100 MG tablet Take 1 tablet by mouth daily.     amLODipine (NORVASC) 10 MG tablet Take 1 tablet (10 mg total) by mouth daily. 90 tablet 3   ARIPiprazole (ABILIFY) 2 MG tablet Take 2 mg by mouth daily.     ARIPiprazole (ABILIFY) 5 MG tablet Take 5 mg by mouth daily.     aspirin 81 MG chewable tablet Chew 81 mg by mouth daily.     aspirin EC 81 MG tablet Take 81 mg by mouth daily.     BELSOMRA 10 MG TABS Take 10 mg by mouth at bedtime as needed for sleep.      Buprenorphine HCl (BELBUCA) 450 MCG FILM Place 1 Film inside cheek 2 (two) times daily.     buPROPion (WELLBUTRIN XL) 300 MG 24 hr tablet Take 1 tablet by mouth daily.     buPROPion (WELLBUTRIN XL) 300 MG 24 hr tablet Take 300 mg by mouth daily.     cloNIDine (CATAPRES) 0.1 MG tablet Take 1 tablet by mouth 2 (two) times daily.     diclofenac (VOLTAREN) 75 MG EC tablet Take 75 mg by mouth daily.     diclofenac  sodium (VOLTAREN) 1 % GEL Apply 1 % topically as needed for pain.     DILT-XR 240 MG 24 hr capsule Take 240 mg by mouth daily.     donepezil (ARICEPT) 10 MG tablet Take 10 mg by mouth daily.     DULoxetine (CYMBALTA) 60 MG capsule Take 60 mg by mouth daily.     estrogens, conjugated, (PREMARIN) 0.625 MG tablet Take 1 tablet by mouth daily.     fluticasone (FLONASE) 50 MCG/ACT nasal spray Place 1 spray into both nostrils daily as needed for allergies or rhinitis.     furosemide (LASIX) 20 MG tablet Take 40 mg by mouth 2 (two) times daily. Per patient increase dose to 40 mg am and pm  and at noon she is taking 1  20 mg tablet     levothyroxine (SYNTHROID, LEVOTHROID) 25 MCG tablet Take 1 tablet by mouth daily.     loratadine (CLARITIN) 10 MG tablet Take 10 mg by mouth daily.     nitroGLYCERIN (NITROSTAT) 0.4 MG SL tablet Place 0.4 mg under the tongue as needed for chest pain.     nitroGLYCERIN (NITROSTAT) 0.4 MG SL tablet Place 0.4 mg under the tongue as needed for chest pain.     omeprazole (PRILOSEC) 20 MG capsule Take 1 capsule by mouth daily.     oxybutynin (DITROPAN-XL) 10 MG 24 hr tablet Take 10 mg by mouth daily.     Oxycodone HCl 10 MG TABS Take 10 mg by mouth 4 (four) times daily as needed for pain.     potassium chloride (K-DUR) 10 MEQ tablet Take 1 tablet by mouth daily.     rOPINIRole (REQUIP) 1 MG tablet Take 1 tablet by mouth daily.     tiZANidine (ZANAFLEX) 2 MG tablet Take 2 mg by mouth every 8 (eight) hours as needed for muscle spasms.     topiramate (TOPAMAX) 25 MG tablet  Take 25 mg by mouth 2 (two) times daily.     traZODone (DESYREL) 100 MG tablet Take 100 mg by mouth at bedtime.     Vitamin D, Ergocalciferol, (DRISDOL) 1.25 MG (50000 UNIT) CAPS capsule Take 50,000 Units by mouth once a week.     No current facility-administered medications for this visit.    REVIEW OF SYSTEMS:  [X]  denotes positive finding, [ ]  denotes negative finding Cardiac  Comments:  Chest pain or chest pressure:    Shortness of breath upon exertion:    Short of breath when lying flat:    Irregular heart rhythm:        Vascular    Pain in calf, thigh, or hip brought on by ambulation:    Pain in feet at night that wakes you up from your sleep:     Blood clot in your veins:    Leg swelling:  x Bilateral      Pulmonary    Oxygen at home:    Productive cough:     Wheezing:         Neurologic    Sudden weakness in arms or legs:     Sudden numbness in arms or legs:     Sudden onset of difficulty speaking or slurred speech:    Temporary loss of vision in one eye:     Problems with dizziness:         Gastrointestinal    Blood in stool:     Vomited blood:         Genitourinary  Burning when urinating:     Blood in urine:        Psychiatric    Major depression:         Hematologic    Bleeding problems:    Problems with blood clotting too easily:        Skin    Rashes or ulcers:        Constitutional    Fever or chills:      PHYSICAL EXAM: Vitals:   02/10/21 1111  BP: (!) 153/50  Pulse: 78  Resp: 16  Temp: 97.6 F (36.4 C)  TempSrc: Temporal  SpO2: 95%  Weight: 190 lb (86.2 kg)  Height: 5\' 5"  (1.651 m)    GENERAL: The patient is a well-nourished female, in no acute distress. The vital signs are documented above. CARDIAC: There is a regular rate and rhythm.  VASCULAR:  Palpable femoral pulses bilaterally Palpable DP pulses bilaterally Marked lower extremity edema, no open wounds Positive stemmer sign PULMONARY: No respiratory  distress. ABDOMEN: Soft and non-tender. MUSCULOSKELETAL: There are no major deformities or cyanosis. NEUROLOGIC: No focal weakness or paresthesias are detected. SKIN: There are no ulcers or rashes noted. PSYCHIATRIC: The patient has a normal affect.     DATA:   Indications: Pain, Swelling, and Edema.     Performing Technologist: Delorise Shiner RVT      Examination Guidelines: A complete evaluation includes B-mode imaging,  spectral  Doppler, color Doppler, and power Doppler as needed of all accessible  portions  of each vessel. Bilateral testing is considered an integral part of a  complete  examination. Limited examinations for reoccurring indications may be  performed  as noted. The reflux portion of the exam is performed with the patient in  reverse Trendelenburg.  Significant venous reflux is defined as >500 ms in the superficial venous  system, and >1 second in the deep venous system.      Venous Reflux Times  +--------------+---------+------+-----------+------------+--------+  RIGHT         Reflux NoRefluxReflux TimeDiameter cmsComments                          Yes                                   +--------------+---------+------+-----------+------------+--------+  CFV           no                                              +--------------+---------+------+-----------+------------+--------+  FV prox       no                                              +--------------+---------+------+-----------+------------+--------+  FV dist       no                                              +--------------+---------+------+-----------+------------+--------+  Popliteal     no                                              +--------------+---------+------+-----------+------------+--------+  GSV at [facility]    no                           0.621              +--------------+---------+------+-----------+------------+--------+  GSV  prox thighno                           0.365              +--------------+---------+------+-----------+------------+--------+  GSV mid thigh no                           0.487              +--------------+---------+------+-----------+------------+--------+  GSV dist thighno                           0.451              +--------------+---------+------+-----------+------------+--------+  GSV at knee   no                           0.499              +--------------+---------+------+-----------+------------+--------+  GSV prox calf                              0.404              +--------------+---------+------+-----------+------------+--------+  GSV mid calf                               0.393              +--------------+---------+------+-----------+------------+--------+  SSV Pop Fossa no                           0.242              +--------------+---------+------+-----------+------------+--------+  SSV prox calf no                           0.237              +--------------+---------+------+-----------+------------+--------+  SSV mid calf                               0.228              +--------------+---------+------+-----------+------------+--------+      +--------------+---------+------+-----------+------------+--------+  LEFT          Reflux NoRefluxReflux TimeDiameter cmsComments                          Yes                                   +--------------+---------+------+-----------+------------+--------+  CFV           no                                              +--------------+---------+------+-----------+------------+--------+  FV prox       no                                              +--------------+---------+------+-----------+------------+--------+  FV mid        no                                               +--------------+---------+------+-----------+------------+--------+  FV dist       no                                              +--------------+---------+------+-----------+------------+--------+  Popliteal     no                                              +--------------+---------+------+-----------+------------+--------+  GSV at SFJ    no                           0.429              +--------------+---------+------+-----------+------------+--------+  GSV prox thighno                           0.397              +--------------+---------+------+-----------+------------+--------+  GSV mid thigh no                           0.333              +--------------+---------+------+-----------+------------+--------+  GSV dist thighno                           0.387              +--------------+---------+------+-----------+------------+--------+  GSV at knee   no                           0.447              +--------------+---------+------+-----------+------------+--------+  GSV prox calf                              0.397              +--------------+---------+------+-----------+------------+--------+  GSV mid calf                               0.302              +--------------+---------+------+-----------+------------+--------+  SSV Pop Fossa no                           0.303              +--------------+---------+------+-----------+------------+--------+  SSV  prox calf no                           0.417              +--------------+---------+------+-----------+------------+--------+  SSV mid calf                               0.181              +--------------+---------+------+-----------+------------+--------+           Summary:  Bilateral:  - No evidence of deep vein thrombosis seen in the lower extremities,  bilaterally, from the common femoral through the popliteal veins.  - No evidence of  superficial venous thrombosis in the lower extremities,  bilaterally.  - No evidence of deep venous insufficiency seen bilaterally in the lower  extremity.  - No evidence of superficial venous reflux seen in the greater saphenous  veins bilaterally.  - No evidence of superficial venous reflux seen in the short saphenous  veins bilaterally.   Assessment/Plan:  75 year old female presents for evaluation of bilateral lower extremity chronic swelling.  Her venous reflux study today shows no evidence of venous insufficiency as I discussed with her and her husband.  She does have a positive stemmer sign and I suspect her lower extremity edema is related to lymphedema.  I discussed the importance of conservative therapy including leg elevation, exercise, compression therapy.  We will get her sized today for appropriate compression stockings.  He is having trouble wearing thigh-high compression stocking so we will try knee-high stockings with a 15 to 20 mm Hg rating.  Discussed that if she finds no relief with compression therapy we could try and get her lymphedema pump but this would require her to at least try conservative management first for  at least 4 weeks.  Discussed she let us know if this does not help.   Marty Heck, MD [facility] Office: [phone]

## 2021-02-13 DIAGNOSIS — Z79899 Other long term (current) drug therapy: Secondary | ICD-10-CM | POA: Diagnosis not present

## 2021-02-13 DIAGNOSIS — Z7982 Long term (current) use of aspirin: Secondary | ICD-10-CM | POA: Diagnosis not present

## 2021-02-13 DIAGNOSIS — I890 Lymphedema, not elsewhere classified: Secondary | ICD-10-CM | POA: Diagnosis not present

## 2021-02-13 DIAGNOSIS — E039 Hypothyroidism, unspecified: Secondary | ICD-10-CM | POA: Diagnosis not present

## 2021-02-13 DIAGNOSIS — I10 Essential (primary) hypertension: Secondary | ICD-10-CM | POA: Diagnosis not present

## 2021-02-13 DIAGNOSIS — R002 Palpitations: Secondary | ICD-10-CM | POA: Diagnosis not present

## 2021-02-17 DIAGNOSIS — M961 Postlaminectomy syndrome, not elsewhere classified: Secondary | ICD-10-CM | POA: Diagnosis not present

## 2021-02-17 DIAGNOSIS — Z79899 Other long term (current) drug therapy: Secondary | ICD-10-CM | POA: Diagnosis not present

## 2021-02-17 DIAGNOSIS — M5414 Radiculopathy, thoracic region: Secondary | ICD-10-CM | POA: Diagnosis not present

## 2021-02-17 DIAGNOSIS — G894 Chronic pain syndrome: Secondary | ICD-10-CM | POA: Diagnosis not present

## 2021-02-19 DIAGNOSIS — N343 Urethral syndrome, unspecified: Secondary | ICD-10-CM | POA: Diagnosis not present

## 2021-02-19 DIAGNOSIS — E876 Hypokalemia: Secondary | ICD-10-CM | POA: Diagnosis not present

## 2021-02-19 DIAGNOSIS — N760 Acute vaginitis: Secondary | ICD-10-CM | POA: Diagnosis not present

## 2021-02-19 DIAGNOSIS — M7989 Other specified soft tissue disorders: Secondary | ICD-10-CM | POA: Diagnosis not present

## 2021-02-19 DIAGNOSIS — Z09 Encounter for follow-up examination after completed treatment for conditions other than malignant neoplasm: Secondary | ICD-10-CM | POA: Diagnosis not present

## 2021-02-24 DIAGNOSIS — R071 Chest pain on breathing: Secondary | ICD-10-CM | POA: Diagnosis not present

## 2021-02-24 DIAGNOSIS — R0781 Pleurodynia: Secondary | ICD-10-CM | POA: Diagnosis not present

## 2021-02-24 DIAGNOSIS — N390 Urinary tract infection, site not specified: Secondary | ICD-10-CM | POA: Diagnosis not present

## 2021-02-24 DIAGNOSIS — M25461 Effusion, right knee: Secondary | ICD-10-CM | POA: Diagnosis not present

## 2021-02-24 DIAGNOSIS — W07XXXA Fall from chair, initial encounter: Secondary | ICD-10-CM | POA: Diagnosis not present

## 2021-02-24 DIAGNOSIS — I700 Atherosclerosis of aorta: Secondary | ICD-10-CM | POA: Diagnosis not present

## 2021-02-24 DIAGNOSIS — S82031A Displaced transverse fracture of right patella, initial encounter for closed fracture: Secondary | ICD-10-CM | POA: Diagnosis not present

## 2021-02-24 DIAGNOSIS — M25561 Pain in right knee: Secondary | ICD-10-CM | POA: Diagnosis not present

## 2021-03-05 DIAGNOSIS — M25561 Pain in right knee: Secondary | ICD-10-CM | POA: Diagnosis not present

## 2021-03-05 DIAGNOSIS — M542 Cervicalgia: Secondary | ICD-10-CM | POA: Diagnosis not present

## 2021-03-05 DIAGNOSIS — S82001A Unspecified fracture of right patella, initial encounter for closed fracture: Secondary | ICD-10-CM | POA: Diagnosis not present

## 2021-03-05 DIAGNOSIS — I10 Essential (primary) hypertension: Secondary | ICD-10-CM | POA: Diagnosis not present

## 2021-03-05 DIAGNOSIS — Z96651 Presence of right artificial knee joint: Secondary | ICD-10-CM | POA: Diagnosis not present

## 2021-03-07 DIAGNOSIS — M542 Cervicalgia: Secondary | ICD-10-CM | POA: Diagnosis not present

## 2021-03-11 ENCOUNTER — Other Ambulatory Visit: Payer: Self-pay

## 2021-03-11 ENCOUNTER — Encounter: Payer: Self-pay | Admitting: Cardiology

## 2021-03-11 ENCOUNTER — Ambulatory Visit: Payer: PPO | Admitting: Cardiology

## 2021-03-11 ENCOUNTER — Ambulatory Visit (INDEPENDENT_AMBULATORY_CARE_PROVIDER_SITE_OTHER): Payer: PPO

## 2021-03-11 VITALS — BP 148/60 | HR 73 | Ht 65.0 in | Wt 207.0 lb

## 2021-03-11 DIAGNOSIS — R002 Palpitations: Secondary | ICD-10-CM

## 2021-03-11 DIAGNOSIS — I351 Nonrheumatic aortic (valve) insufficiency: Secondary | ICD-10-CM | POA: Diagnosis not present

## 2021-03-11 DIAGNOSIS — I10 Essential (primary) hypertension: Secondary | ICD-10-CM | POA: Diagnosis not present

## 2021-03-11 MED ORDER — NITROGLYCERIN 0.4 MG SL SUBL: 0.4000 mg | SUBLINGUAL_TABLET | SUBLINGUAL | 3 refills | Status: DC | PRN

## 2021-03-11 NOTE — Patient Instructions (Signed)
Medication Instructions:  Your physician has recommended you make the following change in your medication:   Use nitroglycerin 1 tablet placed under the tongue at the first sign of chest pain or an angina attack. 1 tablet may be used every 5 minutes as needed, for up to 15 minutes. Do not take more than 3 tablets in 15 minutes. If pain persist call 911 or go to the nearest ED.   *If you need a refill on your cardiac medications before your next appointment, please call your pharmacy*   Lab Work: None ordered If you have labs (blood work) drawn today and your tests are completely normal, you will receive your results only by: [facility] (if you have MyChart) OR A paper copy in the mail If you have any lab test that is abnormal or we need to change your treatment, we will call you to review the results.   Testing/Procedures: Nitroglycerin Sublingual Tablets What is this medication? NITROGLYCERIN (nye troe GLI ser in) prevents and treats chest pain (angina). It works by relaxing blood vessels, which decreases the amount of work the heart has to do. It belongs to a group of medications called nitrates. This medicine may be used for other purposes; ask your health care provider or pharmacist if you have questions. COMMON BRAND NAME(S): Nitroquick, Nitrostat, Nitrotab What should I tell my care team before I take this medication? They need to know if you have any of these conditions: Anemia Head injury, recent stroke, or bleeding in the brain Liver disease Previous heart attack An unusual or allergic reaction to nitroglycerin, other medications, foods, dyes, or preservatives Pregnant or trying to get pregnant Breast-feeding How should I use this medication? Take this medication by mouth as needed. Use at the first sign of an angina attack (chest pain or tightness). You can also take this medication 5 to 10 minutes before an event likely to produce chest pain. Follow the directions  exactly as written on the prescription label. Place one tablet under your tongue and let it dissolve. Do not swallow whole. Replace the dose if you accidentally swallow it. It will help if your mouth is not dry. Saliva around the tablet will help it to dissolve more quickly. Do not eat or drink, smoke or chew tobacco while a tablet is dissolving. Sit down when taking this medication. In an angina attack, you should feel better within 5 minutes after your first dose. You can take a dose every 5 minutes up to a total of 3 doses. If you do not feel better or feel worse after 1 dose, call 9-1-1 at once. Do not take more than 3 doses in 15 minutes. Your care team might give you other directions. Follow those directions if they do. Do not take your medication more often than directed. Talk to your care team about the use of this medication in children. Special care may be needed. Overdosage: If you think you have taken too much of this medicine contact a poison control center or emergency room at once. NOTE: This medicine is only for you. Do not share this medicine with others. What if I miss a dose? This does not apply. This medication is only used as needed. What may interact with this medication? Do not take this medication with any of the following: Certain migraine medications like ergotamine and dihydroergotamine (DHE) Medications used to treat erectile dysfunction like sildenafil, tadalafil, and vardenafil Riociguat This medication may also interact with the following: Alteplase Aspirin Heparin  Medications for high blood pressure Medications for mental depression Other medications used to treat angina Phenothiazines like chlorpromazine, mesoridazine, prochlorperazine, thioridazine This list may not describe all possible interactions. Give your health care provider a list of all the medicines, herbs, non-prescription drugs, or dietary supplements you use. Also tell them if you smoke, drink  alcohol, or use illegal drugs. Some items may interact with your medicine. What should I watch for while using this medication? Tell your care team if you feel your medication is no longer working. Keep this medication with you at all times. Sit or lie down when you take your medication to prevent falling if you feel dizzy or faint after using it. Try to remain calm. This will help you to feel better faster. If you feel dizzy, take several deep breaths and lie down with your feet propped up, or bend forward with your head resting between your knees. You may get drowsy or dizzy. Do not drive, use machinery, or do anything that needs mental alertness until you know how this medication affects you. Do not stand or sit up quickly, especially if you are an older patient. This reduces the risk of dizzy or fainting spells. Alcohol can make you more drowsy and dizzy. Avoid alcoholic drinks. Do not treat yourself for coughs, colds, or pain while you are taking this medication without asking your care team for advice. Some ingredients may increase your blood pressure. What side effects may I notice from receiving this medication? Side effects that you should report to your care team as soon as possible: Allergic reactions--skin rash, itching, hives, swelling of the face, lips, tongue, or throat Headache, unusual weakness or fatigue, shortness of breath, nausea, vomiting, rapid heartbeat, blue skin or lips, which may be signs of methemoglobinemia Increased pressure around the brain--severe headache, blurry vision, change in vision, nausea, vomiting Low blood pressure--dizziness, feeling faint or lightheaded, blurry vision Slow heartbeat--dizziness, feeling faint or lightheaded, confusion, trouble breathing, unusual weakness or fatigue Worsening chest pain (angina)--pain, pressure, or tightness in the chest, neck, back, or arms Side effects that usually do not require medical attention (report to your care team if  they continue or are bothersome): Dizziness Flushing Headache This list may not describe all possible side effects. Call your doctor for medical advice about side effects. You may report side effects to FDA at 1-800-FDA-1088. Where should I keep my medication? Keep out of the reach of children. Store at room temperature between 20 and 25 degrees C (68 and 77 degrees F). Store in [facility]. Protect from light and moisture. Keep tightly closed. Throw away any unused medication after the expiration date. NOTE: This sheet is a summary. It may not cover all possible information. If you have questions about this medicine, talk to your doctor, pharmacist, or health care provider.  2022 Elsevier/Gold Standard (2020-07-15 00:00:00)  The Zio system is proven and trusted by physicians to detect and diagnose irregular heart rhythms -- and has been prescribed to hundreds of thousands of patients.  The FDA has cleared the Zio system to monitor for many different kinds of irregular heart rhythms. In a study, physicians were able to reach a diagnosis 90% of the time with the Zio system1.  You can wear the Zio monitor -- a small, discreet, comfortable patch -- during your normal day-to-day activity, including while you sleep, shower, and exercise, while it records every single heartbeat for analysis.  1Barrett, P., et al. Comparison of 24 Hour Holter Monitoring Versus 14  Day Novel Adhesive Patch Electrocardiographic Monitoring. Evaro, 2014.  ZIO VS. HOLTER MONITORING The Zio monitor can be comfortably worn for up to 14 days. Holter monitors can be worn for 24 to 48 hours, limiting the time to record any irregular heart rhythms you may have. Zio is able to capture data for the 51% of patients who have their first symptom-triggered arrhythmia after 48 hours.1  LIVE WITHOUT RESTRICTIONS The Zio ambulatory cardiac monitor is a small, unobtrusive, and water-resistant patch--you  might even forget you're wearing it. The Zio monitor records and stores every beat of your heart, whether you're sleeping, working out, or showering. Wear the monitor for 14 days, remove 03/25/21   Follow-Up: At [facility], you and your health needs are our priority.  As part of our continuing mission to provide you with exceptional heart care, we have created designated Provider Care Teams.  These Care Teams include your primary Cardiologist (physician) and Advanced Practice Providers (APPs -  Physician Assistants and Nurse Practitioners) who all work together to provide you with the care you need, when you need it.  We recommend signing up for the patient portal called "MyChart".  Sign up information is provided on this After Visit Summary.  MyChart is used to connect with patients for Virtual Visits (Telemedicine).  Patients are able to view lab/test results, encounter notes, upcoming appointments, etc.  Non-urgent messages can be sent to your provider as well.   To learn more about what you can do with MyChart, go to [email or web address].    Your next appointment:   4 month(s)  The format for your next appointment:   In Person  Provider:   Jyl Heinz, MD   Other Instructions NA
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
## 2021-03-11 NOTE — Addendum Note (Signed)
Addended by: Anselm Pancoast on: 03/11/2021 05:22 PM   Modules accepted: Orders

## 2021-03-17 ENCOUNTER — Other Ambulatory Visit: Payer: Self-pay

## 2021-03-17 ENCOUNTER — Ambulatory Visit: Payer: PPO | Admitting: Vascular Surgery

## 2021-03-17 VITALS — BP 152/78 | HR 72 | Temp 98.2°F | Resp 20 | Ht 64.0 in | Wt 202.0 lb

## 2021-03-17 DIAGNOSIS — M7989 Other specified soft tissue disorders: Secondary | ICD-10-CM

## 2021-03-17 DIAGNOSIS — I890 Lymphedema, not elsewhere classified: Secondary | ICD-10-CM | POA: Diagnosis not present

## 2021-03-17 DIAGNOSIS — S82001D Unspecified fracture of right patella, subsequent encounter for closed fracture with routine healing: Secondary | ICD-10-CM | POA: Diagnosis not present

## 2021-03-17 DIAGNOSIS — Z96651 Presence of right artificial knee joint: Secondary | ICD-10-CM | POA: Diagnosis not present

## 2021-03-17 DIAGNOSIS — M25561 Pain in right knee: Secondary | ICD-10-CM | POA: Diagnosis not present

## 2021-03-17 NOTE — Progress Notes (Signed)
Patient name: Lauren Roth MRN: [id number] DOB: Feb 01, 1946 Sex: female  REASON FOR CONSULT: Evaluate for lymphedema pumps   HPI: Lauren Roth is a 75 y.o. female, with history of hypertension, fibromyalgia, arthritis, chronic pain that presents for further evaluation of her lower extremity lymphedema.  She was recently seen about a month ago for bilateral lower extremity profound swelling.  Her reflux study showed no evidence of venous insufficiency.  Given positive stemmer sign we felt this was likely related to lymphedema.  She had tried compression stockings in the past but found these to be uncomfortable and we ultimately sized her appropriately for compression stockings.  She continues to have significant fluid leaking from both lower extremities.  She has been wearing her compression stockings daily.  She has tried exercise as well as leg elevation.   Past Medical History:  Diagnosis Date   Allergic rhinitis 01/16/2009   Qualifier: Diagnosis of  By: Loanne Drilling MD, Jacelyn Pi   Formatting of this note might be different from the original. Overview:  Qualifier: Diagnosis of  By: Loanne Drilling MD, Sean A   Arthritis    DEGENERATIVE [place] DISEASE, LUMBOSACRAL SPINE 01/16/2009   Qualifier: Diagnosis of  By: Loanne Drilling MD, Sean A    Depression    DEPRESSION 01/16/2009   Qualifier: Diagnosis of  By: Loanne Drilling MD, [place]  A    DIAPHORESIS 01/16/2009   Qualifier: Diagnosis of  By: Loanne Drilling MD, Sean A    Diaphoresis 01/16/2009   Qualifier: Diagnosis of  By: Loanne Drilling MD, Jacelyn Pi  Formatting of this note might be different from the original. Overview:  Qualifier: Diagnosis of  By: Loanne Drilling MD, Sean A   Edema 01/16/2009   Qualifier: Diagnosis of  By: Loanne Drilling MD, Roena Malady of this note might be different from the original. Overview:  Qualifier: Diagnosis of  By: Loanne Drilling MD, Jacelyn Pi   Encounter for therapeutic drug monitoring 08/25/2010   Essential hypertension 01/28/2017   Essential  thrombocytosis ([place]) 11/25/2017   Fibromyalgia    GERD 01/16/2009   Qualifier: Diagnosis of  By: Loanne Drilling MD, Sean A    Hypertension    HYPERTENSION 01/16/2009   Qualifier: Diagnosis of  By: Loanne Drilling MD, Sean A    Hypokalemia 01/16/2009   Qualifier: Diagnosis of  By: Loanne Drilling MD, Jacelyn Pi   Formatting of this note might be different from the original. Overview:  Qualifier: Diagnosis of  By: Loanne Drilling MD, Sean A   Left hip pain 05/24/2019   Lumbosacral spondylosis 10/09/2010   Memory loss 01/16/2009   Qualifier: Diagnosis of  By: Loanne Drilling MD, Jacelyn Pi   Formatting of this note might be different from the original. Overview:  Qualifier: Diagnosis of  By: Loanne Drilling MD, Sean A   Menopausal and postmenopausal disorder 01/16/2009   Qualifier: Diagnosis of  By: Loanne Drilling MD, Jacelyn Pi   Formatting of this note might be different from the original. Overview:  Qualifier: Diagnosis of  By: Loanne Drilling MD, Sean A   Moderate aortic regurgitation 05/21/2019   Neuropathy    Pain in soft tissues of limb 06/20/2012   Formatting of this note might be different from the original. Overview:  IMPRESSION: Left knee pain Formatting of this note might be different from the original. IMPRESSION: Left knee pain   Pain in thoracic spine 07/30/2013   Palpitations 01/28/2017   Pre-operative cardiovascular examination 11/25/2017   Primary osteoarthritis of right knee 10/06/2014   Restless legs syndrome 08/25/2010  Shortness of breath on exertion 01/28/2017   SI (sacroiliac) pain 05/10/2016   Thoracic spondylosis 11/20/2010   Overview:  Overview:  IMPRESSION: Thoracic radiculopathy Overview:  IMPRESSION: Thoracic radiculopathy  Formatting of this note might be different from the original. Overview:  IMPRESSION: Thoracic radiculopathy Formatting of this note might be different from the original. IMPRESSION: Thoracic radiculopathy   Thrombocythemia, essential ([place]) 09/12/2013   Thyroid disease    TMJ (dislocation of temporomandibular joint) 07/30/2013    Unspecified hypothyroidism 01/16/2009   Qualifier: Diagnosis of  By: Loanne Drilling MD, Sean A    Uterine cancer ([place]) 07/30/2013    Past Surgical History:  Procedure Laterality Date   APPENDECTOMY     CERVICAL DISC SURGERY     CHOLECYSTECTOMY     FRACTURE SURGERY Left    Left arm has a metal plate   KNEE SURGERY      Family History  Problem Relation Age of Onset   Deep vein thrombosis Father    Heart disease Father    Heart failure Father    Heart disease Brother    Pulmonary embolism Brother    Heart disease Brother     SOCIAL HISTORY: Social History   Socioeconomic History   Marital status: Married    Spouse name: Not on file   Number of children: Not on file   Years of education: Not on file   Highest education level: Not on file  Occupational History   Not on file  Tobacco Use   Smoking status: Former   Smokeless tobacco: Never   Tobacco comments:    at age 63  Vaping Use   Vaping Use: Never used  Substance and Sexual Activity   Alcohol use: No   Drug use: No   Sexual activity: Not on file  Other Topics Concern   Not on file  Social History Narrative   Not on file   Social Determinants of Health   Financial Resource Strain: Not on file  Food Insecurity: Not on file  Transportation Needs: Not on file  Physical Activity: Not on file  Stress: Not on file  Social Connections: Not on file  Intimate Partner Violence: Not on file    Allergies  Allergen Reactions   Cottonseed Oil Hives and Itching    Raw Cotton Raw Cotton Raw Cotton Other reaction(s): Hives, Itching Raw Cotton Raw Cotton   Codeine Sulfate     Current Outpatient Medications  Medication Sig Dispense Refill   albuterol (VENTOLIN HFA) 108 (90 Base) MCG/ACT inhaler Inhale 2 puffs into the lungs every 6 (six) hours as needed for wheezing or shortness of breath.     amLODipine (NORVASC) 10 MG tablet Take 1 tablet (10 mg total) by mouth daily. 90 tablet 3   ARIPiprazole (ABILIFY) 5 MG tablet  Take 5 mg by mouth daily.     aspirin 81 MG chewable tablet Chew 81 mg by mouth daily.     Buprenorphine HCl (BELBUCA) 450 MCG FILM Place 1 Film inside cheek 2 (two) times daily.     buPROPion (WELLBUTRIN XL) 300 MG 24 hr tablet Take 1 tablet by mouth daily.     cloNIDine (CATAPRES) 0.1 MG tablet Take 1 tablet by mouth 2 (two) times daily.     diclofenac sodium (VOLTAREN) 1 % GEL Apply 1 % topically as needed for pain.     DILT-XR 240 MG 24 hr capsule Take 240 mg by mouth daily.     donepezil (ARICEPT) 10 MG tablet Take  10 mg by mouth daily.     DULoxetine (CYMBALTA) 60 MG capsule Take 60 mg by mouth daily.     estrogens, conjugated, (PREMARIN) 0.625 MG tablet Take 1 tablet by mouth daily.     fluticasone (FLONASE) 50 MCG/ACT nasal spray Place 1 spray into both nostrils daily as needed for allergies or rhinitis.     furosemide (LASIX) 20 MG tablet Take 40 mg by mouth 2 (two) times daily. Per patient increase dose to 40 mg am and pm  and at noon she is taking 1  20 mg tablet     levothyroxine (SYNTHROID, LEVOTHROID) 25 MCG tablet Take 1 tablet by mouth daily.     loratadine (CLARITIN) 10 MG tablet Take 10 mg by mouth daily.     montelukast (SINGULAIR) 10 MG tablet Take 10 mg by mouth at bedtime.     morphine (MSIR) 30 MG tablet Take 30 mg by mouth 2 (two) times daily.     nitroGLYCERIN (NITROSTAT) 0.4 MG SL tablet Place 1 tablet (0.4 mg total) under the tongue as needed for chest pain. 25 tablet 3   omeprazole (PRILOSEC) 20 MG capsule Take 1 capsule by mouth daily.     oxybutynin (DITROPAN-XL) 10 MG 24 hr tablet Take 10 mg by mouth daily.     potassium chloride (MICRO-K) 10 MEQ CR capsule Take 10 mEq by mouth 2 (two) times daily.     pregabalin (LYRICA) 75 MG capsule Take 75 mg by mouth 2 (two) times daily.     rOPINIRole (REQUIP) 1 MG tablet Take 1 tablet by mouth daily.     tiZANidine (ZANAFLEX) 2 MG tablet Take 2 mg by mouth every 8 (eight) hours as needed for muscle spasms.     topiramate  (TOPAMAX) 25 MG tablet Take 25 mg by mouth 2 (two) times daily.     traZODone (DESYREL) 100 MG tablet Take 100 mg by mouth at bedtime.     Vitamin D, Ergocalciferol, (DRISDOL) 1.25 MG (50000 UNIT) CAPS capsule Take 50,000 Units by mouth once a week.     buprenorphine (SUBUTEX) 2 MG SUBL SL tablet Place 2 mg under the tongue daily.     oxyCODONE-acetaminophen (PERCOCET) 10-325 MG tablet Take 1 tablet by mouth 4 (four) times daily.     No current facility-administered medications for this visit.    REVIEW OF SYSTEMS:  [X]  denotes positive finding, [ ]  denotes negative finding Cardiac  Comments:  Chest pain or chest pressure:    Shortness of breath upon exertion:    Short of breath when lying flat:    Irregular heart rhythm:        Vascular    Pain in calf, thigh, or hip brought on by ambulation:    Pain in feet at night that wakes you up from your sleep:     Blood clot in your veins:    Leg swelling:  x Bilateral      Pulmonary    Oxygen at home:    Productive cough:     Wheezing:         Neurologic    Sudden weakness in arms or legs:     Sudden numbness in arms or legs:     Sudden onset of difficulty speaking or slurred speech:    Temporary loss of vision in one eye:     Problems with dizziness:         Gastrointestinal    Blood in stool:  Vomited blood:         Genitourinary    Burning when urinating:     Blood in urine:        Psychiatric    Major depression:         Hematologic    Bleeding problems:    Problems with blood clotting too easily:        Skin    Rashes or ulcers:        Constitutional    Fever or chills:      PHYSICAL EXAM: Vitals:   03/17/21 1521  BP: (!) 152/78  Pulse: 72  Resp: 20  Temp: 98.2 F (36.8 C)  TempSrc: Temporal  SpO2: 93%  Weight: 202 lb (91.6 kg)  Height: 5\' 4"  (1.626 m)    GENERAL: The patient is a well-nourished female, in no acute distress. The vital signs are documented above. CARDIAC: There is a regular rate  and rhythm.  VASCULAR:  Palpable femoral pulses bilaterally Palpable DP pulses bilaterally Marked lower extremity edema, lymphorrhea Positive stemmer sign PULMONARY: No respiratory distress. ABDOMEN: Soft and non-tender. MUSCULOSKELETAL: There are no major deformities or cyanosis. NEUROLOGIC: No focal weakness or paresthesias are detected. SKIN: There are no ulcers or rashes noted. PSYCHIATRIC: The patient has a normal affect.   DATA:   Indications: Pain, Swelling, and Edema.     Performing Technologist: Delorise Shiner RVT      Examination Guidelines: A complete evaluation includes B-mode imaging,  spectral  Doppler, color Doppler, and power Doppler as needed of all accessible  portions  of each vessel. Bilateral testing is considered an integral part of a  complete  examination. Limited examinations for reoccurring indications may be  performed  as noted. The reflux portion of the exam is performed with the patient in  reverse Trendelenburg.  Significant venous reflux is defined as >500 ms in the superficial venous  system, and >1 second in the deep venous system.      Venous Reflux Times  +--------------+---------+------+-----------+------------+--------+  RIGHT         Reflux NoRefluxReflux TimeDiameter cmsComments                          Yes                                   +--------------+---------+------+-----------+------------+--------+  CFV           no                                              +--------------+---------+------+-----------+------------+--------+  FV prox       no                                              +--------------+---------+------+-----------+------------+--------+  FV dist       no                                              +--------------+---------+------+-----------+------------+--------+  Popliteal     no                                               +--------------+---------+------+-----------+------------+--------+  GSV at [facility]    no                           0.621              +--------------+---------+------+-----------+------------+--------+  GSV prox thighno                           0.365              +--------------+---------+------+-----------+------------+--------+  GSV mid thigh no                           0.487              +--------------+---------+------+-----------+------------+--------+  GSV dist thighno                           0.451              +--------------+---------+------+-----------+------------+--------+  GSV at knee   no                           0.499              +--------------+---------+------+-----------+------------+--------+  GSV prox calf                              0.404              +--------------+---------+------+-----------+------------+--------+  GSV mid calf                               0.393              +--------------+---------+------+-----------+------------+--------+  SSV Pop Fossa no                           0.242              +--------------+---------+------+-----------+------------+--------+  SSV prox calf no                           0.237              +--------------+---------+------+-----------+------------+--------+  SSV mid calf                               0.228              +--------------+---------+------+-----------+------------+--------+      +--------------+---------+------+-----------+------------+--------+  LEFT          Reflux NoRefluxReflux TimeDiameter cmsComments                          Yes                                   +--------------+---------+------+-----------+------------+--------+  CFV           no                                              +--------------+---------+------+-----------+------------+--------+  FV prox       no                                               +--------------+---------+------+-----------+------------+--------+  FV mid        no                                              +--------------+---------+------+-----------+------------+--------+  FV dist       no                                              +--------------+---------+------+-----------+------------+--------+  Popliteal     no                                              +--------------+---------+------+-----------+------------+--------+  GSV at SFJ    no                           0.429              +--------------+---------+------+-----------+------------+--------+  GSV prox thighno                           0.397              +--------------+---------+------+-----------+------------+--------+  GSV mid thigh no                           0.333              +--------------+---------+------+-----------+------------+--------+  GSV dist thighno                           0.387              +--------------+---------+------+-----------+------------+--------+  GSV at knee   no                           0.447              +--------------+---------+------+-----------+------------+--------+  GSV prox calf                              0.397              +--------------+---------+------+-----------+------------+--------+  GSV mid calf                               0.302              +--------------+---------+------+-----------+------------+--------+  SSV Pop Fossa no                           0.303              +--------------+---------+------+-----------+------------+--------+  SSV  prox calf no                           0.417              +--------------+---------+------+-----------+------------+--------+  SSV mid calf                               0.181              +--------------+---------+------+-----------+------------+--------+           Summary:  Bilateral:  -  No evidence of deep vein thrombosis seen in the lower extremities,  bilaterally, from the common femoral through the popliteal veins.  - No evidence of superficial venous thrombosis in the lower extremities,  bilaterally.  - No evidence of deep venous insufficiency seen bilaterally in the lower  extremity.  - No evidence of superficial venous reflux seen in the greater saphenous  veins bilaterally.  - No evidence of superficial venous reflux seen in the short saphenous  veins bilaterally.   Assessment/Plan:  75 year old female presents with bilateral lower extremity lymphedema.  She has now tried at least 4 weeks of conservative therapy with compression, elevation and exercise with ongoing discomfort and marked lymphorrhea.  Ultimately at this point in time, I would recommend lymphedema pumps of her bilateral lower extremities.  She does have profound lymphorrhea on exam with significant fluid leakage and has been compliant with her compression stockings leg elevation and other conservative measures.  Our office will work on making the referral.  Discussed she typically needs to wear these at least 2 hours a day.  She will otherwise need to continue wearing her compression stockings.  Marty Heck, MD [facility] Office: [phone]

## 2021-03-18 DIAGNOSIS — G894 Chronic pain syndrome: Secondary | ICD-10-CM | POA: Diagnosis not present

## 2021-03-18 DIAGNOSIS — M961 Postlaminectomy syndrome, not elsewhere classified: Secondary | ICD-10-CM | POA: Diagnosis not present

## 2021-03-18 DIAGNOSIS — B372 Candidiasis of skin and nail: Secondary | ICD-10-CM | POA: Diagnosis not present

## 2021-03-18 DIAGNOSIS — M5414 Radiculopathy, thoracic region: Secondary | ICD-10-CM | POA: Diagnosis not present

## 2021-03-18 DIAGNOSIS — I10 Essential (primary) hypertension: Secondary | ICD-10-CM | POA: Diagnosis not present

## 2021-03-18 DIAGNOSIS — Z6832 Body mass index (BMI) 32.0-32.9, adult: Secondary | ICD-10-CM | POA: Diagnosis not present

## 2021-03-18 DIAGNOSIS — N390 Urinary tract infection, site not specified: Secondary | ICD-10-CM | POA: Diagnosis not present

## 2021-03-23 DIAGNOSIS — I10 Essential (primary) hypertension: Secondary | ICD-10-CM | POA: Diagnosis not present

## 2021-03-23 DIAGNOSIS — M7989 Other specified soft tissue disorders: Secondary | ICD-10-CM | POA: Diagnosis not present

## 2021-03-25 DIAGNOSIS — R002 Palpitations: Secondary | ICD-10-CM | POA: Diagnosis not present

## 2021-03-31 DIAGNOSIS — R002 Palpitations: Secondary | ICD-10-CM | POA: Diagnosis not present

## 2021-04-02 DIAGNOSIS — S82001A Unspecified fracture of right patella, initial encounter for closed fracture: Secondary | ICD-10-CM | POA: Diagnosis not present

## 2021-04-03 DIAGNOSIS — T502X5A Adverse effect of carbonic-anhydrase inhibitors, benzothiadiazides and other diuretics, initial encounter: Secondary | ICD-10-CM | POA: Diagnosis not present

## 2021-04-03 DIAGNOSIS — J9601 Acute respiratory failure with hypoxia: Secondary | ICD-10-CM | POA: Diagnosis not present

## 2021-04-03 DIAGNOSIS — Z7982 Long term (current) use of aspirin: Secondary | ICD-10-CM | POA: Diagnosis not present

## 2021-04-03 DIAGNOSIS — E889 Metabolic disorder, unspecified: Secondary | ICD-10-CM | POA: Diagnosis not present

## 2021-04-03 DIAGNOSIS — I160 Hypertensive urgency: Secondary | ICD-10-CM | POA: Diagnosis not present

## 2021-04-03 DIAGNOSIS — F419 Anxiety disorder, unspecified: Secondary | ICD-10-CM | POA: Diagnosis not present

## 2021-04-03 DIAGNOSIS — Z885 Allergy status to narcotic agent status: Secondary | ICD-10-CM | POA: Diagnosis not present

## 2021-04-03 DIAGNOSIS — I351 Nonrheumatic aortic (valve) insufficiency: Secondary | ICD-10-CM | POA: Diagnosis not present

## 2021-04-03 DIAGNOSIS — I110 Hypertensive heart disease with heart failure: Secondary | ICD-10-CM | POA: Diagnosis not present

## 2021-04-03 DIAGNOSIS — Z8614 Personal history of Methicillin resistant Staphylococcus aureus infection: Secondary | ICD-10-CM | POA: Diagnosis not present

## 2021-04-03 DIAGNOSIS — G629 Polyneuropathy, unspecified: Secondary | ICD-10-CM | POA: Diagnosis not present

## 2021-04-03 DIAGNOSIS — I890 Lymphedema, not elsewhere classified: Secondary | ICD-10-CM | POA: Diagnosis not present

## 2021-04-03 DIAGNOSIS — R0602 Shortness of breath: Secondary | ICD-10-CM | POA: Diagnosis not present

## 2021-04-03 DIAGNOSIS — Z86718 Personal history of other venous thrombosis and embolism: Secondary | ICD-10-CM | POA: Diagnosis not present

## 2021-04-03 DIAGNOSIS — K219 Gastro-esophageal reflux disease without esophagitis: Secondary | ICD-10-CM | POA: Diagnosis not present

## 2021-04-03 DIAGNOSIS — I10 Essential (primary) hypertension: Secondary | ICD-10-CM | POA: Diagnosis not present

## 2021-04-03 DIAGNOSIS — E8770 Fluid overload, unspecified: Secondary | ICD-10-CM | POA: Diagnosis not present

## 2021-04-03 DIAGNOSIS — G2581 Restless legs syndrome: Secondary | ICD-10-CM | POA: Diagnosis not present

## 2021-04-03 DIAGNOSIS — I080 Rheumatic disorders of both mitral and aortic valves: Secondary | ICD-10-CM | POA: Diagnosis not present

## 2021-04-03 DIAGNOSIS — R079 Chest pain, unspecified: Secondary | ICD-10-CM | POA: Diagnosis not present

## 2021-04-03 DIAGNOSIS — Z79899 Other long term (current) drug therapy: Secondary | ICD-10-CM | POA: Diagnosis not present

## 2021-04-03 DIAGNOSIS — I5033 Acute on chronic diastolic (congestive) heart failure: Secondary | ICD-10-CM | POA: Diagnosis not present

## 2021-04-03 DIAGNOSIS — J9811 Atelectasis: Secondary | ICD-10-CM | POA: Diagnosis not present

## 2021-04-03 DIAGNOSIS — F32A Depression, unspecified: Secondary | ICD-10-CM | POA: Diagnosis not present

## 2021-04-03 DIAGNOSIS — R600 Localized edema: Secondary | ICD-10-CM | POA: Diagnosis not present

## 2021-04-03 DIAGNOSIS — R9431 Abnormal electrocardiogram [ECG] [EKG]: Secondary | ICD-10-CM | POA: Diagnosis not present

## 2021-04-03 DIAGNOSIS — M797 Fibromyalgia: Secondary | ICD-10-CM | POA: Diagnosis not present

## 2021-04-03 DIAGNOSIS — D509 Iron deficiency anemia, unspecified: Secondary | ICD-10-CM | POA: Diagnosis not present

## 2021-04-03 DIAGNOSIS — R0789 Other chest pain: Secondary | ICD-10-CM | POA: Diagnosis not present

## 2021-04-03 DIAGNOSIS — E876 Hypokalemia: Secondary | ICD-10-CM | POA: Diagnosis not present

## 2021-04-03 DIAGNOSIS — Z91048 Other nonmedicinal substance allergy status: Secondary | ICD-10-CM | POA: Diagnosis not present

## 2021-04-03 DIAGNOSIS — E039 Hypothyroidism, unspecified: Secondary | ICD-10-CM | POA: Diagnosis not present

## 2021-04-03 DIAGNOSIS — J90 Pleural effusion, not elsewhere classified: Secondary | ICD-10-CM | POA: Diagnosis not present

## 2021-04-04 DIAGNOSIS — F419 Anxiety disorder, unspecified: Secondary | ICD-10-CM | POA: Diagnosis not present

## 2021-04-04 DIAGNOSIS — G629 Polyneuropathy, unspecified: Secondary | ICD-10-CM | POA: Diagnosis not present

## 2021-04-04 DIAGNOSIS — F32A Depression, unspecified: Secondary | ICD-10-CM | POA: Diagnosis not present

## 2021-04-04 DIAGNOSIS — R0602 Shortness of breath: Secondary | ICD-10-CM | POA: Diagnosis not present

## 2021-04-04 DIAGNOSIS — I160 Hypertensive urgency: Secondary | ICD-10-CM | POA: Diagnosis not present

## 2021-04-04 DIAGNOSIS — G2581 Restless legs syndrome: Secondary | ICD-10-CM | POA: Diagnosis not present

## 2021-04-04 DIAGNOSIS — I5033 Acute on chronic diastolic (congestive) heart failure: Secondary | ICD-10-CM | POA: Diagnosis not present

## 2021-04-04 DIAGNOSIS — K219 Gastro-esophageal reflux disease without esophagitis: Secondary | ICD-10-CM | POA: Diagnosis not present

## 2021-04-04 DIAGNOSIS — I110 Hypertensive heart disease with heart failure: Secondary | ICD-10-CM | POA: Diagnosis not present

## 2021-04-04 DIAGNOSIS — R600 Localized edema: Secondary | ICD-10-CM | POA: Diagnosis not present

## 2021-04-04 DIAGNOSIS — E039 Hypothyroidism, unspecified: Secondary | ICD-10-CM | POA: Diagnosis not present

## 2021-04-04 DIAGNOSIS — Z86718 Personal history of other venous thrombosis and embolism: Secondary | ICD-10-CM | POA: Diagnosis not present

## 2021-04-04 DIAGNOSIS — D509 Iron deficiency anemia, unspecified: Secondary | ICD-10-CM | POA: Diagnosis not present

## 2021-04-05 DIAGNOSIS — E039 Hypothyroidism, unspecified: Secondary | ICD-10-CM | POA: Diagnosis not present

## 2021-04-05 DIAGNOSIS — I160 Hypertensive urgency: Secondary | ICD-10-CM | POA: Diagnosis not present

## 2021-04-05 DIAGNOSIS — J9601 Acute respiratory failure with hypoxia: Secondary | ICD-10-CM | POA: Diagnosis not present

## 2021-04-05 DIAGNOSIS — I5033 Acute on chronic diastolic (congestive) heart failure: Secondary | ICD-10-CM | POA: Diagnosis not present

## 2021-04-05 DIAGNOSIS — F32A Depression, unspecified: Secondary | ICD-10-CM | POA: Diagnosis not present

## 2021-04-05 DIAGNOSIS — I110 Hypertensive heart disease with heart failure: Secondary | ICD-10-CM | POA: Diagnosis not present

## 2021-04-05 DIAGNOSIS — G629 Polyneuropathy, unspecified: Secondary | ICD-10-CM | POA: Diagnosis not present

## 2021-04-05 DIAGNOSIS — F419 Anxiety disorder, unspecified: Secondary | ICD-10-CM | POA: Diagnosis not present

## 2021-04-05 DIAGNOSIS — D509 Iron deficiency anemia, unspecified: Secondary | ICD-10-CM | POA: Diagnosis not present

## 2021-04-05 DIAGNOSIS — G2581 Restless legs syndrome: Secondary | ICD-10-CM | POA: Diagnosis not present

## 2021-04-05 DIAGNOSIS — K219 Gastro-esophageal reflux disease without esophagitis: Secondary | ICD-10-CM | POA: Diagnosis not present

## 2021-04-06 DIAGNOSIS — J9601 Acute respiratory failure with hypoxia: Secondary | ICD-10-CM | POA: Diagnosis not present

## 2021-04-06 DIAGNOSIS — I110 Hypertensive heart disease with heart failure: Secondary | ICD-10-CM | POA: Diagnosis not present

## 2021-04-06 DIAGNOSIS — D509 Iron deficiency anemia, unspecified: Secondary | ICD-10-CM | POA: Diagnosis not present

## 2021-04-06 DIAGNOSIS — G629 Polyneuropathy, unspecified: Secondary | ICD-10-CM | POA: Diagnosis not present

## 2021-04-06 DIAGNOSIS — F419 Anxiety disorder, unspecified: Secondary | ICD-10-CM | POA: Diagnosis not present

## 2021-04-06 DIAGNOSIS — G2581 Restless legs syndrome: Secondary | ICD-10-CM | POA: Diagnosis not present

## 2021-04-06 DIAGNOSIS — F32A Depression, unspecified: Secondary | ICD-10-CM | POA: Diagnosis not present

## 2021-04-06 DIAGNOSIS — E039 Hypothyroidism, unspecified: Secondary | ICD-10-CM | POA: Diagnosis not present

## 2021-04-06 DIAGNOSIS — I5033 Acute on chronic diastolic (congestive) heart failure: Secondary | ICD-10-CM | POA: Diagnosis not present

## 2021-04-06 DIAGNOSIS — I160 Hypertensive urgency: Secondary | ICD-10-CM | POA: Diagnosis not present

## 2021-04-06 DIAGNOSIS — K219 Gastro-esophageal reflux disease without esophagitis: Secondary | ICD-10-CM | POA: Diagnosis not present

## 2021-04-07 DIAGNOSIS — J9601 Acute respiratory failure with hypoxia: Secondary | ICD-10-CM | POA: Diagnosis not present

## 2021-04-07 DIAGNOSIS — I351 Nonrheumatic aortic (valve) insufficiency: Secondary | ICD-10-CM | POA: Diagnosis not present

## 2021-04-07 DIAGNOSIS — I110 Hypertensive heart disease with heart failure: Secondary | ICD-10-CM | POA: Diagnosis not present

## 2021-04-07 DIAGNOSIS — D509 Iron deficiency anemia, unspecified: Secondary | ICD-10-CM | POA: Diagnosis not present

## 2021-04-07 DIAGNOSIS — G2581 Restless legs syndrome: Secondary | ICD-10-CM | POA: Diagnosis not present

## 2021-04-07 DIAGNOSIS — Z86718 Personal history of other venous thrombosis and embolism: Secondary | ICD-10-CM | POA: Diagnosis not present

## 2021-04-07 DIAGNOSIS — E039 Hypothyroidism, unspecified: Secondary | ICD-10-CM | POA: Diagnosis not present

## 2021-04-07 DIAGNOSIS — I160 Hypertensive urgency: Secondary | ICD-10-CM | POA: Diagnosis not present

## 2021-04-07 DIAGNOSIS — F32A Depression, unspecified: Secondary | ICD-10-CM | POA: Diagnosis not present

## 2021-04-07 DIAGNOSIS — E876 Hypokalemia: Secondary | ICD-10-CM | POA: Diagnosis not present

## 2021-04-07 DIAGNOSIS — I5033 Acute on chronic diastolic (congestive) heart failure: Secondary | ICD-10-CM | POA: Diagnosis not present

## 2021-04-07 DIAGNOSIS — I890 Lymphedema, not elsewhere classified: Secondary | ICD-10-CM | POA: Diagnosis not present

## 2021-04-07 DIAGNOSIS — G629 Polyneuropathy, unspecified: Secondary | ICD-10-CM | POA: Diagnosis not present

## 2021-04-07 DIAGNOSIS — K219 Gastro-esophageal reflux disease without esophagitis: Secondary | ICD-10-CM | POA: Diagnosis not present

## 2021-04-07 DIAGNOSIS — F419 Anxiety disorder, unspecified: Secondary | ICD-10-CM | POA: Diagnosis not present

## 2021-04-08 DIAGNOSIS — E039 Hypothyroidism, unspecified: Secondary | ICD-10-CM | POA: Diagnosis not present

## 2021-04-08 DIAGNOSIS — I5033 Acute on chronic diastolic (congestive) heart failure: Secondary | ICD-10-CM | POA: Diagnosis not present

## 2021-04-08 DIAGNOSIS — E876 Hypokalemia: Secondary | ICD-10-CM | POA: Diagnosis not present

## 2021-04-08 DIAGNOSIS — G629 Polyneuropathy, unspecified: Secondary | ICD-10-CM | POA: Diagnosis not present

## 2021-04-08 DIAGNOSIS — F419 Anxiety disorder, unspecified: Secondary | ICD-10-CM | POA: Diagnosis not present

## 2021-04-08 DIAGNOSIS — G2581 Restless legs syndrome: Secondary | ICD-10-CM | POA: Diagnosis not present

## 2021-04-08 DIAGNOSIS — F32A Depression, unspecified: Secondary | ICD-10-CM | POA: Diagnosis not present

## 2021-04-08 DIAGNOSIS — D509 Iron deficiency anemia, unspecified: Secondary | ICD-10-CM | POA: Diagnosis not present

## 2021-04-08 DIAGNOSIS — K219 Gastro-esophageal reflux disease without esophagitis: Secondary | ICD-10-CM | POA: Diagnosis not present

## 2021-04-08 DIAGNOSIS — I110 Hypertensive heart disease with heart failure: Secondary | ICD-10-CM | POA: Diagnosis not present

## 2021-04-08 DIAGNOSIS — I160 Hypertensive urgency: Secondary | ICD-10-CM | POA: Diagnosis not present

## 2021-04-08 DIAGNOSIS — J9601 Acute respiratory failure with hypoxia: Secondary | ICD-10-CM | POA: Diagnosis not present

## 2021-04-09 DIAGNOSIS — J9601 Acute respiratory failure with hypoxia: Secondary | ICD-10-CM | POA: Diagnosis not present

## 2021-04-09 DIAGNOSIS — F32A Depression, unspecified: Secondary | ICD-10-CM | POA: Diagnosis not present

## 2021-04-09 DIAGNOSIS — K219 Gastro-esophageal reflux disease without esophagitis: Secondary | ICD-10-CM | POA: Diagnosis not present

## 2021-04-09 DIAGNOSIS — G2581 Restless legs syndrome: Secondary | ICD-10-CM | POA: Diagnosis not present

## 2021-04-09 DIAGNOSIS — E039 Hypothyroidism, unspecified: Secondary | ICD-10-CM | POA: Diagnosis not present

## 2021-04-09 DIAGNOSIS — I110 Hypertensive heart disease with heart failure: Secondary | ICD-10-CM | POA: Diagnosis not present

## 2021-04-09 DIAGNOSIS — I160 Hypertensive urgency: Secondary | ICD-10-CM | POA: Diagnosis not present

## 2021-04-09 DIAGNOSIS — I5033 Acute on chronic diastolic (congestive) heart failure: Secondary | ICD-10-CM | POA: Diagnosis not present

## 2021-04-09 DIAGNOSIS — F419 Anxiety disorder, unspecified: Secondary | ICD-10-CM | POA: Diagnosis not present

## 2021-04-09 DIAGNOSIS — E876 Hypokalemia: Secondary | ICD-10-CM | POA: Diagnosis not present

## 2021-04-09 DIAGNOSIS — D509 Iron deficiency anemia, unspecified: Secondary | ICD-10-CM | POA: Diagnosis not present

## 2021-04-09 DIAGNOSIS — G629 Polyneuropathy, unspecified: Secondary | ICD-10-CM | POA: Diagnosis not present

## 2021-04-11 DIAGNOSIS — M1990 Unspecified osteoarthritis, unspecified site: Secondary | ICD-10-CM | POA: Diagnosis not present

## 2021-04-11 DIAGNOSIS — F32A Depression, unspecified: Secondary | ICD-10-CM | POA: Diagnosis not present

## 2021-04-11 DIAGNOSIS — K219 Gastro-esophageal reflux disease without esophagitis: Secondary | ICD-10-CM | POA: Diagnosis not present

## 2021-04-11 DIAGNOSIS — E039 Hypothyroidism, unspecified: Secondary | ICD-10-CM | POA: Diagnosis not present

## 2021-04-11 DIAGNOSIS — I509 Heart failure, unspecified: Secondary | ICD-10-CM | POA: Diagnosis not present

## 2021-04-11 DIAGNOSIS — W19XXXD Unspecified fall, subsequent encounter: Secondary | ICD-10-CM | POA: Diagnosis not present

## 2021-04-11 DIAGNOSIS — Z79891 Long term (current) use of opiate analgesic: Secondary | ICD-10-CM | POA: Diagnosis not present

## 2021-04-11 DIAGNOSIS — M5136 Other intervertebral disc degeneration, lumbar region: Secondary | ICD-10-CM | POA: Diagnosis not present

## 2021-04-11 DIAGNOSIS — M4727 Other spondylosis with radiculopathy, lumbosacral region: Secondary | ICD-10-CM | POA: Diagnosis not present

## 2021-04-11 DIAGNOSIS — I110 Hypertensive heart disease with heart failure: Secondary | ICD-10-CM | POA: Diagnosis not present

## 2021-04-11 DIAGNOSIS — F419 Anxiety disorder, unspecified: Secondary | ICD-10-CM | POA: Diagnosis not present

## 2021-04-11 DIAGNOSIS — Z9181 History of falling: Secondary | ICD-10-CM | POA: Diagnosis not present

## 2021-04-11 DIAGNOSIS — M797 Fibromyalgia: Secondary | ICD-10-CM | POA: Diagnosis not present

## 2021-04-11 DIAGNOSIS — M47814 Spondylosis without myelopathy or radiculopathy, thoracic region: Secondary | ICD-10-CM | POA: Diagnosis not present

## 2021-04-11 DIAGNOSIS — S82001D Unspecified fracture of right patella, subsequent encounter for closed fracture with routine healing: Secondary | ICD-10-CM | POA: Diagnosis not present

## 2021-04-11 DIAGNOSIS — Z79899 Other long term (current) drug therapy: Secondary | ICD-10-CM | POA: Diagnosis not present

## 2021-04-16 DIAGNOSIS — W19XXXD Unspecified fall, subsequent encounter: Secondary | ICD-10-CM | POA: Diagnosis not present

## 2021-04-16 DIAGNOSIS — M4727 Other spondylosis with radiculopathy, lumbosacral region: Secondary | ICD-10-CM | POA: Diagnosis not present

## 2021-04-16 DIAGNOSIS — M1990 Unspecified osteoarthritis, unspecified site: Secondary | ICD-10-CM | POA: Diagnosis not present

## 2021-04-16 DIAGNOSIS — M797 Fibromyalgia: Secondary | ICD-10-CM | POA: Diagnosis not present

## 2021-04-16 DIAGNOSIS — K219 Gastro-esophageal reflux disease without esophagitis: Secondary | ICD-10-CM | POA: Diagnosis not present

## 2021-04-16 DIAGNOSIS — M5136 Other intervertebral disc degeneration, lumbar region: Secondary | ICD-10-CM | POA: Diagnosis not present

## 2021-04-16 DIAGNOSIS — I509 Heart failure, unspecified: Secondary | ICD-10-CM | POA: Diagnosis not present

## 2021-04-16 DIAGNOSIS — F419 Anxiety disorder, unspecified: Secondary | ICD-10-CM | POA: Diagnosis not present

## 2021-04-16 DIAGNOSIS — I110 Hypertensive heart disease with heart failure: Secondary | ICD-10-CM | POA: Diagnosis not present

## 2021-04-16 DIAGNOSIS — Z9181 History of falling: Secondary | ICD-10-CM | POA: Diagnosis not present

## 2021-04-16 DIAGNOSIS — Z79891 Long term (current) use of opiate analgesic: Secondary | ICD-10-CM | POA: Diagnosis not present

## 2021-04-16 DIAGNOSIS — Z79899 Other long term (current) drug therapy: Secondary | ICD-10-CM | POA: Diagnosis not present

## 2021-04-16 DIAGNOSIS — M47814 Spondylosis without myelopathy or radiculopathy, thoracic region: Secondary | ICD-10-CM | POA: Diagnosis not present

## 2021-04-16 DIAGNOSIS — F32A Depression, unspecified: Secondary | ICD-10-CM | POA: Diagnosis not present

## 2021-04-16 DIAGNOSIS — S82001D Unspecified fracture of right patella, subsequent encounter for closed fracture with routine healing: Secondary | ICD-10-CM | POA: Diagnosis not present

## 2021-04-16 DIAGNOSIS — E039 Hypothyroidism, unspecified: Secondary | ICD-10-CM | POA: Diagnosis not present

## 2021-04-17 ENCOUNTER — Encounter: Payer: Self-pay | Admitting: Cardiology

## 2021-04-17 ENCOUNTER — Other Ambulatory Visit: Payer: Self-pay

## 2021-04-17 ENCOUNTER — Ambulatory Visit: Payer: PPO | Admitting: Cardiology

## 2021-04-17 VITALS — BP 103/56 | HR 64 | Ht 64.0 in | Wt 192.0 lb

## 2021-04-17 DIAGNOSIS — I10 Essential (primary) hypertension: Secondary | ICD-10-CM | POA: Diagnosis not present

## 2021-04-17 DIAGNOSIS — I351 Nonrheumatic aortic (valve) insufficiency: Secondary | ICD-10-CM

## 2021-04-17 MED ORDER — AMLODIPINE BESYLATE 5 MG PO TABS: 5.0000 mg | ORAL_TABLET | Freq: Every day | ORAL | 3 refills | Status: DC

## 2021-04-17 NOTE — Progress Notes (Signed)
Cardiology Office Note:    Date:  04/17/2021   ID:  Lauren Roth, DOB 03-30-1946, MRN [id number]  PCP:  Nicholos Johns, MD  Cardiologist:  Jenean Lindau, MD   Referring MD: Nicholos Johns, MD    ASSESSMENT:    1. Essential hypertension   2. Moderate aortic regurgitation    PLAN:    In order of problems listed above:  Primary prevention stressed with the patient.  Importance of compliance with diet medication stressed and she vocalized understanding. Essential hypertension: Blood pressure stable and diet was emphasized. Congestive heart failure: I increased her diuretic to 40 mg twice daily for the next 3 days.  She will keep a track of her blood pressures and heart rates.  Also she will check her weight on a regular basis.  I reviewed hospital records extensively.  Diet was emphasized. Moderate aortic regurgitation: Stable at this time and medical management.  They had mentioned to her the possibility of surgical evaluation but finally they decided on medical management which I think is appropriate for this lady who is frail and multiple risk factors. Obesity: Weight reduction was stressed diet was emphasized.  She will be seen follow-up appointment in a month or earlier if she has any concerns   Medication Adjustments/Labs and Tests Ordered: Current medicines are reviewed at length with the patient today.  Concerns regarding medicines are outlined above.  No orders of the defined types were placed in this encounter.  Meds ordered this encounter  Medications   amLODipine (NORVASC) 5 MG tablet    Sig: Take 1 tablet (5 mg total) by mouth daily.    Dispense:  90 tablet    Refill:  3     No chief complaint on file.    History of Present Illness:    Lauren Roth is a 74 y.o. female.  Patient was evaluated recently at [facility] for congestive heart failure.  Echocardiogram has revealed valvular heart disease with moderate aortic  regurgitation.  She underwent diuresis and subsequently has done well.  No chest pain orthopnea or PND.  She tells me that she is gradually gaining weight again.  No issues with shortness of breath or any such symptoms.  I reviewed [facility] records.  Echocardiogram report has been detailed below.  Past Medical History:  Diagnosis Date   Allergic rhinitis 01/16/2009   Qualifier: Diagnosis of  By: Loanne Drilling MD, Jacelyn Pi   Formatting of this note might be different from the original. Overview:  Qualifier: Diagnosis of  By: Loanne Drilling MD, Sean A   Arthritis    DEGENERATIVE [place] DISEASE, LUMBOSACRAL SPINE 01/16/2009   Qualifier: Diagnosis of  By: Loanne Drilling MD, Sean A    Depression    DEPRESSION 01/16/2009   Qualifier: Diagnosis of  By: Loanne Drilling MD, Hilliard Clark A    DIAPHORESIS 01/16/2009   Qualifier: Diagnosis of  By: Loanne Drilling MD, Sean A    Diaphoresis 01/16/2009   Qualifier: Diagnosis of  By: Loanne Drilling MD, Jacelyn Pi  Formatting of this note might be different from the original. Overview:  Qualifier: Diagnosis of  By: Loanne Drilling MD, Sean A   Edema 01/16/2009   Qualifier: Diagnosis of  By: Loanne Drilling MD, Roena Malady of this note might be different from the original. Overview:  Qualifier: Diagnosis of  By: Loanne Drilling MD, Jacelyn Pi   Encounter for therapeutic drug monitoring 08/25/2010   Essential hypertension 01/28/2017   Essential thrombocytosis ([place]) 11/25/2017  Fibromyalgia    GERD 01/16/2009   Qualifier: Diagnosis of  By: Loanne Drilling MD, Sean A    Hypertension    HYPERTENSION 01/16/2009   Qualifier: Diagnosis of  By: Loanne Drilling MD, Hilliard Clark A    Hypokalemia 01/16/2009   Qualifier: Diagnosis of  By: Loanne Drilling MD, Roena Malady of this note might be different from the original. Overview:  Qualifier: Diagnosis of  By: Loanne Drilling MD, Sean A   Left hip pain 05/24/2019   Lumbosacral spondylosis 10/09/2010   Memory loss 01/16/2009   Qualifier: Diagnosis of  By: Loanne Drilling MD, Jacelyn Pi   Formatting of this note might be different  from the original. Overview:  Qualifier: Diagnosis of  By: Loanne Drilling MD, Sean A   Menopausal and postmenopausal disorder 01/16/2009   Qualifier: Diagnosis of  By: Loanne Drilling MD, Jacelyn Pi   Formatting of this note might be different from the original. Overview:  Qualifier: Diagnosis of  By: Loanne Drilling MD, Sean A   Moderate aortic regurgitation 05/21/2019   Neuropathy    Pain in soft tissues of limb 06/20/2012   Formatting of this note might be different from the original. Overview:  IMPRESSION: Left knee pain Formatting of this note might be different from the original. IMPRESSION: Left knee pain   Pain in thoracic spine 07/30/2013   Palpitations 01/28/2017   Pre-operative cardiovascular examination 11/25/2017   Primary osteoarthritis of right knee 10/06/2014   Restless legs syndrome 08/25/2010   Shortness of breath on exertion 01/28/2017   SI (sacroiliac) pain 05/10/2016   Thoracic spondylosis 11/20/2010   Overview:  Overview:  IMPRESSION: Thoracic radiculopathy Overview:  IMPRESSION: Thoracic radiculopathy  Formatting of this note might be different from the original. Overview:  IMPRESSION: Thoracic radiculopathy Formatting of this note might be different from the original. IMPRESSION: Thoracic radiculopathy   Thrombocythemia, essential ([place]) 09/12/2013   Thyroid disease    TMJ (dislocation of temporomandibular joint) 07/30/2013   Unspecified hypothyroidism 01/16/2009   Qualifier: Diagnosis of  By: Loanne Drilling MD, Sean A    Uterine cancer ([place]) 07/30/2013    Past Surgical History:  Procedure Laterality Date   APPENDECTOMY     CERVICAL DISC SURGERY     CHOLECYSTECTOMY     FRACTURE SURGERY Left    Left arm has a metal plate   KNEE SURGERY      Current Medications: Current Meds  Medication Sig   albuterol (VENTOLIN HFA) 108 (90 Base) MCG/ACT inhaler Inhale 2 puffs into the lungs every 6 (six) hours as needed for wheezing or shortness of breath.   ARIPiprazole (ABILIFY) 5 MG tablet Take 5 mg by mouth daily.    ascorbic acid (VITAMIN C) 500 MG tablet Take 1 tablet by mouth daily.   aspirin 81 MG chewable tablet Chew 81 mg by mouth daily.   buprenorphine (SUBUTEX) 2 MG SUBL SL tablet Place 2 mg under the tongue daily.   buPROPion (WELLBUTRIN XL) 300 MG 24 hr tablet Take 1 tablet by mouth daily.   cloNIDine (CATAPRES) 0.1 MG tablet Take 1 tablet by mouth 2 (two) times daily.   diclofenac sodium (VOLTAREN) 1 % GEL Apply 1 % topically as needed for pain.   DILT-XR 240 MG 24 hr capsule Take 240 mg by mouth daily.   donepezil (ARICEPT) 10 MG tablet Take 10 mg by mouth daily.   DULoxetine (CYMBALTA) 60 MG capsule Take 60 mg by mouth daily.   estrogens, conjugated, (PREMARIN) 0.625 MG tablet Take 1 tablet by mouth daily.  fluticasone (FLONASE) 50 MCG/ACT nasal spray Place 1 spray into both nostrils daily as needed for allergies or rhinitis.   furosemide (LASIX) 20 MG tablet Take 40 mg by mouth 2 (two) times daily. Per patient increase dose to 40 mg am and pm  and at noon she is taking 1  20 mg tablet   iron polysaccharides (NIFEREX) 150 MG capsule Take 1 capsule by mouth daily.   levothyroxine (SYNTHROID, LEVOTHROID) 25 MCG tablet Take 1 tablet by mouth daily.   loratadine (CLARITIN) 10 MG tablet Take 10 mg by mouth daily.   losartan (COZAAR) 50 MG tablet Take 50 mg by mouth daily.   montelukast (SINGULAIR) 10 MG tablet Take 10 mg by mouth at bedtime.   nitroGLYCERIN (NITROSTAT) 0.4 MG SL tablet Place 1 tablet (0.4 mg total) under the tongue as needed for chest pain.   omeprazole (PRILOSEC) 20 MG capsule Take 1 capsule by mouth daily.   oxybutynin (DITROPAN-XL) 10 MG 24 hr tablet Take 10 mg by mouth daily.   oxyCODONE-acetaminophen (PERCOCET) 10-325 MG tablet Take 1 tablet by mouth every 4 (four) hours as needed for pain.   potassium chloride (MICRO-K) 10 MEQ CR capsule Take 10 mEq by mouth 2 (two) times daily.   pregabalin (LYRICA) 75 MG capsule Take 75 mg by mouth. Takes 2 capsules (150 mg) nightly at  bedtime   rOPINIRole (REQUIP) 1 MG tablet Take 1 tablet by mouth daily.   tiZANidine (ZANAFLEX) 2 MG tablet Take 2 mg by mouth every 8 (eight) hours as needed for muscle spasms.   topiramate (TOPAMAX) 25 MG tablet Take 25 mg by mouth 2 (two) times daily.   traZODone (DESYREL) 100 MG tablet Take 100 mg by mouth at bedtime.   Vitamin D, Ergocalciferol, (DRISDOL) 1.25 MG (50000 UNIT) CAPS capsule Take 50,000 Units by mouth once a week.   [DISCONTINUED] amLODipine (NORVASC) 10 MG tablet Take 1 tablet (10 mg total) by mouth daily.     Allergies:   Cottonseed oil and Codeine sulfate   Social History   Socioeconomic History   Marital status: Married    Spouse name: Not on file   Number of children: Not on file   Years of education: Not on file   Highest education level: Not on file  Occupational History   Not on file  Tobacco Use   Smoking status: Former   Smokeless tobacco: Never   Tobacco comments:    at age 89  Vaping Use   Vaping Use: Never used  Substance and Sexual Activity   Alcohol use: No   Drug use: No   Sexual activity: Not on file  Other Topics Concern   Not on file  Social History Narrative   Not on file   Social Determinants of Health   Financial Resource Strain: Not on file  Food Insecurity: Not on file  Transportation Needs: Not on file  Physical Activity: Not on file  Stress: Not on file  Social Connections: Not on file     Family History: The patient's family history includes Deep vein thrombosis in her father; Heart disease in her brother, brother, and father; Heart failure in her father; Pulmonary embolism in her brother.  ROS:   Please see the history of present illness.    All other systems reviewed and are negative.  EKGs/Labs/Other Studies Reviewed:    The following studies were reviewed today: SUMMARY  NPS.  The left ventricular size is normal.  LV ejection fraction = 60-65%.  LV Global L Strain =-20.6%.  The right ventricle is  moderately dilated.  The left atrium is mildly dilated.  The right atrium is mildly to moderately dilated.  There is moderate aortic regurgitation.  There is mild to moderate mitral regurgitation.  There is mild tricuspid regurgitation.  IVC size was moderately dilated.  There is a pleural effusion present.    Recent Labs: 05/23/2020: ALT 24; BUN 9; Creatinine 0.8; Potassium 4.2; Sodium 132 11/27/2020: Hemoglobin 12.1; Platelets 462  Recent Lipid Panel No results found for: CHOL, TRIG, HDL, CHOLHDL, VLDL, LDLCALC, LDLDIRECT  Physical Exam:    VS:  BP (!) 103/56    Pulse 64    Ht 5\' 4"  (1.626 m)    Wt 192 lb (87.1 kg)    SpO2 96%    BMI 32.96 kg/m     Wt Readings from Last 3 Encounters:  04/17/21 192 lb (87.1 kg)  03/17/21 202 lb (91.6 kg)  03/11/21 207 lb (93.9 kg)     GEN: Patient is in no acute distress HEENT: Normal NECK: No JVD; No carotid bruits LYMPHATICS: No lymphadenopathy CARDIAC: Hear sounds regular, 2/6 systolic murmur at the apex. RESPIRATORY:  Clear to auscultation without rales, wheezing or rhonchi  ABDOMEN: Soft, non-tender, non-distended MUSCULOSKELETAL:  No edema; No deformity  SKIN: Warm and dry NEUROLOGIC:  Alert and oriented x 3 PSYCHIATRIC:  Normal affect   Signed, Jenean Lindau, MD  04/17/2021 3:02 PM    [facility]

## 2021-04-17 NOTE — Patient Instructions (Signed)
Medication Instructions:  Your physician has recommended you make the following change in your medication:   Decrease your Amlodipine to 5 mg daily. Take 1/2 tablet of your current dose.  Take Furosemide 40 mg twice daily for 3 days, then resume 40 mg daily.  *If you need a refill on your cardiac medications before your next appointment, please call your pharmacy*   Lab Work: None ordered If you have labs (blood work) drawn today and your tests are completely normal, you will receive your results only by: [place] (if you have MyChart) OR A paper copy in the mail If you have any lab test that is abnormal or we need to change your treatment, we will call you to review the results.   Testing/Procedures: None ordered   Follow-Up: At [facility], you and your health needs are our priority.  As part of our continuing mission to provide you with exceptional heart care, we have created designated Provider Care Teams.  These Care Teams include your primary Cardiologist (physician) and Advanced Practice Providers (APPs -  Physician Assistants and Nurse Practitioners) who all work together to provide you with the care you need, when you need it.  We recommend signing up for the patient portal called "MyChart".  Sign up information is provided on this After Visit Summary.  MyChart is used to connect with patients for Virtual Visits (Telemedicine).  Patients are able to view lab/test results, encounter notes, upcoming appointments, etc.  Non-urgent messages can be sent to your provider as well.   To learn more about what you can do with MyChart, go to [email or web address].    Your next appointment:   1 month(s)  The format for your next appointment:   In Person  Provider:   Jyl Heinz, MD   Other Instructions NA

## 2021-04-22 ENCOUNTER — Telehealth: Payer: Self-pay | Admitting: Cardiology

## 2021-04-22 ENCOUNTER — Encounter: Payer: Self-pay | Admitting: Emergency Medicine

## 2021-04-22 DIAGNOSIS — I110 Hypertensive heart disease with heart failure: Secondary | ICD-10-CM | POA: Diagnosis not present

## 2021-04-22 DIAGNOSIS — Z8679 Personal history of other diseases of the circulatory system: Secondary | ICD-10-CM | POA: Diagnosis not present

## 2021-04-22 DIAGNOSIS — F419 Anxiety disorder, unspecified: Secondary | ICD-10-CM | POA: Diagnosis not present

## 2021-04-22 DIAGNOSIS — M7989 Other specified soft tissue disorders: Secondary | ICD-10-CM | POA: Diagnosis not present

## 2021-04-22 DIAGNOSIS — Z79891 Long term (current) use of opiate analgesic: Secondary | ICD-10-CM | POA: Diagnosis not present

## 2021-04-22 DIAGNOSIS — Z09 Encounter for follow-up examination after completed treatment for conditions other than malignant neoplasm: Secondary | ICD-10-CM | POA: Diagnosis not present

## 2021-04-22 DIAGNOSIS — M47814 Spondylosis without myelopathy or radiculopathy, thoracic region: Secondary | ICD-10-CM | POA: Diagnosis not present

## 2021-04-22 DIAGNOSIS — K219 Gastro-esophageal reflux disease without esophagitis: Secondary | ICD-10-CM | POA: Diagnosis not present

## 2021-04-22 DIAGNOSIS — M1990 Unspecified osteoarthritis, unspecified site: Secondary | ICD-10-CM | POA: Diagnosis not present

## 2021-04-22 DIAGNOSIS — M5136 Other intervertebral disc degeneration, lumbar region: Secondary | ICD-10-CM | POA: Diagnosis not present

## 2021-04-22 DIAGNOSIS — Z79899 Other long term (current) drug therapy: Secondary | ICD-10-CM | POA: Diagnosis not present

## 2021-04-22 DIAGNOSIS — M797 Fibromyalgia: Secondary | ICD-10-CM | POA: Diagnosis not present

## 2021-04-22 DIAGNOSIS — I10 Essential (primary) hypertension: Secondary | ICD-10-CM | POA: Diagnosis not present

## 2021-04-22 DIAGNOSIS — F32A Depression, unspecified: Secondary | ICD-10-CM | POA: Diagnosis not present

## 2021-04-22 DIAGNOSIS — I509 Heart failure, unspecified: Secondary | ICD-10-CM | POA: Diagnosis not present

## 2021-04-22 DIAGNOSIS — S82001D Unspecified fracture of right patella, subsequent encounter for closed fracture with routine healing: Secondary | ICD-10-CM | POA: Diagnosis not present

## 2021-04-22 DIAGNOSIS — W19XXXD Unspecified fall, subsequent encounter: Secondary | ICD-10-CM | POA: Diagnosis not present

## 2021-04-22 DIAGNOSIS — E039 Hypothyroidism, unspecified: Secondary | ICD-10-CM | POA: Diagnosis not present

## 2021-04-22 DIAGNOSIS — Z9181 History of falling: Secondary | ICD-10-CM | POA: Diagnosis not present

## 2021-04-22 DIAGNOSIS — M4727 Other spondylosis with radiculopathy, lumbosacral region: Secondary | ICD-10-CM | POA: Diagnosis not present

## 2021-04-22 NOTE — Telephone Encounter (Signed)
Spoke with Marlowe Kays, PT with [facility].  Marlowe Kays is currently with pt - reports pt has had 7 lb weight gain in 1 week.  Pitting edema present in BLE including above knees and some swelling abdomen per pt.  Pt denies shortness of breath and denies any other s/sx.  Skin dry to touch. Pt has compression socks in place.  Pt denies extra sodium intake. Pt states she follows strict sodium and fluid intake.   Pt last seen in office with Dr. Geraldo Pitter 04/17/21.  Pt incr Lasix 40 mg BID x 3 days as was advised at visit.  Pt states she continued to gain wait with incr Lasix. No improvement noted.  Pt is now continuing Lasix 40 mg daily. No missed doses.   Marlowe Kays, PT asks if she may have a verbal order for skilled nursing through [facility].  Will forward to Dr. Geraldo Pitter to advise.

## 2021-04-22 NOTE — Telephone Encounter (Signed)
Spoke to patient informed her no need to have labs drawn here as we can have them drawn when she goes to [place]. She will wait for my call tomorrow.

## 2021-04-22 NOTE — Telephone Encounter (Signed)
Left message for special procedures at [place] to call back to set up outpatient lasix.

## 2021-04-22 NOTE — Telephone Encounter (Signed)
Pt c/o swelling: STAT is pt has developed SOB within 24 hours  If swelling, where is the swelling located? Her legs  How much weight have you gained and in what time span? 7 lbs in a week  Have you gained 3 pounds in a day or 5 pounds in a week? 7 lbs in a week  Do you have a log of your daily weights (if so, list)?   Are you currently taking a fluid pill? yes  Are you currently SOB? no  Have you traveled recently? no

## 2021-04-22 NOTE — Telephone Encounter (Signed)
Spoke to patient informed her to hold lasix and amlodipine starting tomorrow per Dr. Geraldo Pitter. Patient coming in the morning here for blood work. Will work on getting her set up for iv lasix tomorrow and Friday and . Patient informed to start back oral lasix on Saturday and repeat labs again 1 week. She understands. Will follow up with her in the morning on our office. No further questions at this time.

## 2021-04-23 ENCOUNTER — Encounter: Payer: Self-pay | Admitting: Emergency Medicine

## 2021-04-23 DIAGNOSIS — I509 Heart failure, unspecified: Secondary | ICD-10-CM | POA: Diagnosis not present

## 2021-04-23 NOTE — Telephone Encounter (Signed)
Order faxed to [facility]. Patient aware to arrive at 130pm today. No further questions.

## 2021-04-23 NOTE — Telephone Encounter (Signed)
Left message for [facility] special procedures to return call.

## 2021-04-27 ENCOUNTER — Telehealth: Payer: Self-pay | Admitting: Cardiology

## 2021-04-27 DIAGNOSIS — Z9181 History of falling: Secondary | ICD-10-CM | POA: Diagnosis not present

## 2021-04-27 DIAGNOSIS — I509 Heart failure, unspecified: Secondary | ICD-10-CM | POA: Diagnosis not present

## 2021-04-27 DIAGNOSIS — W19XXXD Unspecified fall, subsequent encounter: Secondary | ICD-10-CM | POA: Diagnosis not present

## 2021-04-27 DIAGNOSIS — M797 Fibromyalgia: Secondary | ICD-10-CM | POA: Diagnosis not present

## 2021-04-27 DIAGNOSIS — M1990 Unspecified osteoarthritis, unspecified site: Secondary | ICD-10-CM | POA: Diagnosis not present

## 2021-04-27 DIAGNOSIS — M5136 Other intervertebral disc degeneration, lumbar region: Secondary | ICD-10-CM | POA: Diagnosis not present

## 2021-04-27 DIAGNOSIS — S82001D Unspecified fracture of right patella, subsequent encounter for closed fracture with routine healing: Secondary | ICD-10-CM | POA: Diagnosis not present

## 2021-04-27 DIAGNOSIS — Z79899 Other long term (current) drug therapy: Secondary | ICD-10-CM | POA: Diagnosis not present

## 2021-04-27 DIAGNOSIS — F32A Depression, unspecified: Secondary | ICD-10-CM | POA: Diagnosis not present

## 2021-04-27 DIAGNOSIS — E039 Hypothyroidism, unspecified: Secondary | ICD-10-CM | POA: Diagnosis not present

## 2021-04-27 DIAGNOSIS — Z79891 Long term (current) use of opiate analgesic: Secondary | ICD-10-CM | POA: Diagnosis not present

## 2021-04-27 DIAGNOSIS — F419 Anxiety disorder, unspecified: Secondary | ICD-10-CM | POA: Diagnosis not present

## 2021-04-27 DIAGNOSIS — I110 Hypertensive heart disease with heart failure: Secondary | ICD-10-CM | POA: Diagnosis not present

## 2021-04-27 DIAGNOSIS — M4727 Other spondylosis with radiculopathy, lumbosacral region: Secondary | ICD-10-CM | POA: Diagnosis not present

## 2021-04-27 DIAGNOSIS — M47814 Spondylosis without myelopathy or radiculopathy, thoracic region: Secondary | ICD-10-CM | POA: Diagnosis not present

## 2021-04-27 DIAGNOSIS — K219 Gastro-esophageal reflux disease without esophagitis: Secondary | ICD-10-CM | POA: Diagnosis not present

## 2021-04-27 NOTE — Telephone Encounter (Signed)
Patient was calling back to give update, she stated Dr. Geraldo Pitter wanted her to call back in a week. Patient weighs 147.7

## 2021-04-28 NOTE — Telephone Encounter (Signed)
Spoke with pt who states that her weight today was 183.8 pounds. Pt states that she weighs first thing in the morning before eating or doing anything. Pt states that the 147.7 reported yesterday was incorrect.

## 2021-04-30 NOTE — Telephone Encounter (Signed)
Spoke with pt who states her weight on 1/11 was 181.2 and 04/30/21 it was 180.0.

## 2021-05-08 DIAGNOSIS — I509 Heart failure, unspecified: Secondary | ICD-10-CM | POA: Insufficient documentation

## 2021-05-08 DIAGNOSIS — I5032 Chronic diastolic (congestive) heart failure: Secondary | ICD-10-CM | POA: Insufficient documentation

## 2021-05-08 HISTORY — DX: Heart failure, unspecified: I50.9

## 2021-05-14 ENCOUNTER — Other Ambulatory Visit: Payer: Self-pay

## 2021-05-15 DIAGNOSIS — I890 Lymphedema, not elsewhere classified: Secondary | ICD-10-CM | POA: Diagnosis not present

## 2021-05-18 DIAGNOSIS — I10 Essential (primary) hypertension: Secondary | ICD-10-CM | POA: Diagnosis not present

## 2021-05-18 DIAGNOSIS — J309 Allergic rhinitis, unspecified: Secondary | ICD-10-CM | POA: Diagnosis not present

## 2021-05-18 DIAGNOSIS — E039 Hypothyroidism, unspecified: Secondary | ICD-10-CM | POA: Diagnosis not present

## 2021-05-18 DIAGNOSIS — Z8679 Personal history of other diseases of the circulatory system: Secondary | ICD-10-CM | POA: Diagnosis not present

## 2021-05-18 DIAGNOSIS — Z6827 Body mass index (BMI) 27.0-27.9, adult: Secondary | ICD-10-CM | POA: Diagnosis not present

## 2021-05-18 DIAGNOSIS — Z79899 Other long term (current) drug therapy: Secondary | ICD-10-CM | POA: Diagnosis not present

## 2021-05-18 DIAGNOSIS — E785 Hyperlipidemia, unspecified: Secondary | ICD-10-CM | POA: Diagnosis not present

## 2021-05-18 DIAGNOSIS — M7989 Other specified soft tissue disorders: Secondary | ICD-10-CM | POA: Diagnosis not present

## 2021-05-18 DIAGNOSIS — H6690 Otitis media, unspecified, unspecified ear: Secondary | ICD-10-CM | POA: Diagnosis not present

## 2021-05-18 DIAGNOSIS — H68003 Unspecified Eustachian salpingitis, bilateral: Secondary | ICD-10-CM | POA: Diagnosis not present

## 2021-05-18 DIAGNOSIS — E559 Vitamin D deficiency, unspecified: Secondary | ICD-10-CM | POA: Diagnosis not present

## 2021-05-18 DIAGNOSIS — E663 Overweight: Secondary | ICD-10-CM | POA: Diagnosis not present

## 2021-05-19 ENCOUNTER — Other Ambulatory Visit: Payer: Self-pay

## 2021-05-19 ENCOUNTER — Encounter: Payer: Self-pay | Admitting: Cardiology

## 2021-05-19 ENCOUNTER — Ambulatory Visit: Payer: PPO | Admitting: Cardiology

## 2021-05-19 VITALS — BP 106/52 | HR 84 | Ht 64.0 in | Wt 174.4 lb

## 2021-05-19 DIAGNOSIS — I351 Nonrheumatic aortic (valve) insufficiency: Secondary | ICD-10-CM

## 2021-05-19 DIAGNOSIS — I10 Essential (primary) hypertension: Secondary | ICD-10-CM | POA: Diagnosis not present

## 2021-05-19 DIAGNOSIS — Z79899 Other long term (current) drug therapy: Secondary | ICD-10-CM | POA: Diagnosis not present

## 2021-05-19 DIAGNOSIS — E782 Mixed hyperlipidemia: Secondary | ICD-10-CM | POA: Diagnosis not present

## 2021-05-19 DIAGNOSIS — E559 Vitamin D deficiency, unspecified: Secondary | ICD-10-CM | POA: Diagnosis not present

## 2021-05-19 DIAGNOSIS — H68003 Unspecified Eustachian salpingitis, bilateral: Secondary | ICD-10-CM | POA: Diagnosis not present

## 2021-05-19 DIAGNOSIS — E785 Hyperlipidemia, unspecified: Secondary | ICD-10-CM | POA: Diagnosis not present

## 2021-05-19 NOTE — Patient Instructions (Signed)

## 2021-05-19 NOTE — Progress Notes (Signed)
Cardiology Office Note:    Date:  05/19/2021   ID:  Luisa Dago, DOB 02-03-1946, MRN [id number]  PCP:  Nicholos Johns, MD  Cardiologist:  Jenean Lindau, MD   Referring MD: Nicholos Johns, MD    ASSESSMENT:    1. Essential hypertension   2. Moderate aortic regurgitation    PLAN:    In order of problems listed above:  Primary prevention stressed with the patient.  Importance of compliance with diet medication stressed and she vocalized understanding. Moderate aortic regurgitation: Stable at this time.  Blood pressure stable and will continue to monitor valvular heart disease. Preserved systolic function congestive heart failure: Stable.  Educated about congestive heart failure and she is doing well with this.  If she has any significant weight in a short period of time she was advised to call us.  I discussed this with her at length. Essential hypertension: Blood pressure stable and diet was emphasized.  Lifestyle modification urged. I told her to let her primary care doctor send me a copy of her lab work when she gets a call from them. Patient will be seen in follow-up appointment in 6 months or earlier if the patient has any concerns    Medication Adjustments/Labs and Tests Ordered: Current medicines are reviewed at length with the patient today.  Concerns regarding medicines are outlined above.  No orders of the defined types were placed in this encounter.  No orders of the defined types were placed in this encounter.    No chief complaint on file.    History of Present Illness:    Keyandra Swenson is a 76 y.o. female.  Patient has past medical history of essential hypertension, dyslipidemia, congestive heart failure with preserved ejection fraction.  She was admitted to the hospital in December.  Subsequently since discharge she has been fine.  She tells me that she weighs herself on a regular basis, watches salt and other issues with her diet and is  very meticulous about this.  She denies any chest pain orthopnea or PND.  She is happy with the fact that she hardly has any pedal edema.  At the time of my evaluation, the patient is alert awake oriented and in no distress.  She had blood work at her primary care doctor today.  Past Medical History:  Diagnosis Date   Allergic rhinitis 01/16/2009   Qualifier: Diagnosis of  By: Loanne Drilling MD, Jacelyn Pi   Formatting of this note might be different from the original. Overview:  Qualifier: Diagnosis of  By: Loanne Drilling MD, Sean A   Arthritis    Chronic pain syndrome 07/30/2013   DEGENERATIVE DISC DISEASE, LUMBOSACRAL SPINE 01/16/2009   Qualifier: Diagnosis of  By: Loanne Drilling MD, Sean A    Depression    DEPRESSION 01/16/2009   Qualifier: Diagnosis of  By: Loanne Drilling MD, Hilliard Clark A    DIAPHORESIS 01/16/2009   Qualifier: Diagnosis of  By: Loanne Drilling MD, Sean A    Diaphoresis 01/16/2009   Qualifier: Diagnosis of  By: Loanne Drilling MD, Jacelyn Pi  Formatting of this note might be different from the original. Overview:  Qualifier: Diagnosis of  By: Loanne Drilling MD, Sean A   Edema 01/16/2009   Qualifier: Diagnosis of  By: Loanne Drilling MD, Roena Malady of this note might be different from the original. Overview:  Qualifier: Diagnosis of  By: Loanne Drilling MD, Sean A   Essential hypertension 01/28/2017   Essential thrombocytosis ([place]) 11/25/2017   Fibromyalgia  GERD 01/16/2009   Qualifier: Diagnosis of  By: Loanne Drilling MD, Sean A    Hypertension    HYPERTENSION 01/16/2009   Qualifier: Diagnosis of  By: Loanne Drilling MD, Hilliard Clark A    Hypokalemia 01/16/2009   Qualifier: Diagnosis of  By: Loanne Drilling MD, Roena Malady of this note might be different from the original. Overview:  Qualifier: Diagnosis of  By: Loanne Drilling MD, Sean A   Left hip pain 05/24/2019   Lumbosacral spondylosis 10/09/2010   Lymphedema 02/10/2021   Medication management 08/25/2010   Memory loss 01/16/2009   Qualifier: Diagnosis of  By: Loanne Drilling MD, Jacelyn Pi   Formatting of this note might  be different from the original. Overview:  Qualifier: Diagnosis of  By: Loanne Drilling MD, Sean A   Menopausal and postmenopausal disorder 01/16/2009   Qualifier: Diagnosis of  By: Loanne Drilling MD, Jacelyn Pi   Formatting of this note might be different from the original. Overview:  Qualifier: Diagnosis of  By: Loanne Drilling MD, Sean A   Moderate aortic regurgitation 05/21/2019   Neuropathic pain 09/25/2020   Neuropathy    Pain in soft tissues of limb 06/20/2012   Formatting of this note might be different from the original. Overview:  IMPRESSION: Left knee pain Formatting of this note might be different from the original. IMPRESSION: Left knee pain   Pain in thoracic spine 07/30/2013   Palpitations 01/28/2017   Pre-operative cardiovascular examination 11/25/2017   Primary osteoarthritis of right knee 10/06/2014   Restless legs syndrome 08/25/2010   Shortness of breath on exertion 01/28/2017   SI (sacroiliac) pain 05/10/2016   Thoracic spondylosis 11/20/2010   Overview:  Overview:  IMPRESSION: Thoracic radiculopathy Overview:  IMPRESSION: Thoracic radiculopathy  Formatting of this note might be different from the original. Overview:  IMPRESSION: Thoracic radiculopathy Formatting of this note might be different from the original. IMPRESSION: Thoracic radiculopathy   Thrombocythemia, essential ([place]) 09/12/2013   Thyroid disease    TMJ (dislocation of temporomandibular joint) 07/30/2013   Unspecified hypothyroidism 01/16/2009   Qualifier: Diagnosis of  By: Loanne Drilling MD, Sean A    Uterine cancer ([place]) 07/30/2013    Past Surgical History:  Procedure Laterality Date   APPENDECTOMY     CERVICAL DISC SURGERY     CHOLECYSTECTOMY     FRACTURE SURGERY Left    Left arm has a metal plate   KNEE SURGERY      Current Medications: Current Meds  Medication Sig   albuterol (VENTOLIN HFA) 108 (90 Base) MCG/ACT inhaler Inhale 2 puffs into the lungs every 6 (six) hours as needed for wheezing or shortness of breath.    amLODipine (NORVASC) 5 MG tablet Take 1 tablet (5 mg total) by mouth daily.   amoxicillin-clavulanate (AUGMENTIN) 875-125 MG tablet Take 1 tablet by mouth 2 (two) times daily.   ARIPiprazole (ABILIFY) 5 MG tablet Take 5 mg by mouth daily.   ascorbic acid (VITAMIN C) 500 MG tablet Take 1 tablet by mouth daily.   aspirin 81 MG chewable tablet Chew 81 mg by mouth daily.   buprenorphine (SUBUTEX) 2 MG SUBL SL tablet Place 2 mg under the tongue daily.   buPROPion (WELLBUTRIN XL) 300 MG 24 hr tablet Take 1 tablet by mouth daily.   cloNIDine (CATAPRES) 0.1 MG tablet Take 1 tablet by mouth 2 (two) times daily.   diclofenac sodium (VOLTAREN) 1 % GEL Apply 1 % topically as needed for pain.   DILT-XR 240 MG 24 hr capsule Take 240 mg by mouth  daily.   donepezil (ARICEPT) 10 MG tablet Take 10 mg by mouth daily.   DULoxetine (CYMBALTA) 60 MG capsule Take 60 mg by mouth daily.   estrogens, conjugated, (PREMARIN) 0.625 MG tablet Take 1 tablet by mouth daily.   fluticasone (FLONASE) 50 MCG/ACT nasal spray Place 1 spray into both nostrils daily as needed for allergies or rhinitis.   furosemide (LASIX) 40 MG tablet Take 40 mg by mouth 2 (two) times daily.   iron polysaccharides (NIFEREX) 150 MG capsule Take 1 capsule by mouth daily.   levothyroxine (SYNTHROID, LEVOTHROID) 25 MCG tablet Take 1 tablet by mouth daily.   loratadine (CLARITIN) 10 MG tablet Take 10 mg by mouth daily.   losartan (COZAAR) 50 MG tablet Take 50 mg by mouth daily.   montelukast (SINGULAIR) 10 MG tablet Take 10 mg by mouth at bedtime.   nitroGLYCERIN (NITROSTAT) 0.4 MG SL tablet Place 1 tablet (0.4 mg total) under the tongue as needed for chest pain.   nystatin cream (MYCOSTATIN) Apply 1 application topically 2 (two) times daily.   omeprazole (PRILOSEC) 20 MG capsule Take 1 capsule by mouth daily.   oxybutynin (DITROPAN-XL) 10 MG 24 hr tablet Take 10 mg by mouth daily.   oxyCODONE-acetaminophen (PERCOCET) 10-325 MG tablet Take 1 tablet by  mouth every 4 (four) hours as needed for pain.   potassium chloride (MICRO-K) 10 MEQ CR capsule Take 10 mEq by mouth 2 (two) times daily.   pregabalin (LYRICA) 75 MG capsule Take 150 mg by mouth at bedtime.   rOPINIRole (REQUIP) 1 MG tablet Take 1 tablet by mouth daily.   tiZANidine (ZANAFLEX) 4 MG tablet Take 4 mg by mouth 3 (three) times daily as needed for muscle spasms.   topiramate (TOPAMAX) 25 MG tablet Take 25 mg by mouth 2 (two) times daily.   traZODone (DESYREL) 100 MG tablet Take 100 mg by mouth at bedtime.   Vitamin D, Ergocalciferol, (DRISDOL) 1.25 MG (50000 UNIT) CAPS capsule Take 50,000 Units by mouth once a week.     Allergies:   Cottonseed oil and Codeine sulfate   Social History   Socioeconomic History   Marital status: Married    Spouse name: Not on file   Number of children: Not on file   Years of education: Not on file   Highest education level: Not on file  Occupational History   Not on file  Tobacco Use   Smoking status: Former   Smokeless tobacco: Never   Tobacco comments:    at age 55  Vaping Use   Vaping Use: Never used  Substance and Sexual Activity   Alcohol use: No   Drug use: No   Sexual activity: Not on file  Other Topics Concern   Not on file  Social History Narrative   Not on file   Social Determinants of Health   Financial Resource Strain: Not on file  Food Insecurity: Not on file  Transportation Needs: Not on file  Physical Activity: Not on file  Stress: Not on file  Social Connections: Not on file     Family History: The patient's family history includes Deep vein thrombosis in her father; Heart disease in her brother, brother, and father; Heart failure in her father; Pulmonary embolism in her brother.  ROS:   Please see the history of present illness.    All other systems reviewed and are negative.  EKGs/Labs/Other Studies Reviewed:    The following studies were reviewed today: I discussed my findings with  the patient at  length   Recent Labs: 05/23/2020: ALT 24; BUN 9; Creatinine 0.8; Potassium 4.2; Sodium 132 11/27/2020: Hemoglobin 12.1; Platelets 462  Recent Lipid Panel No results found for: CHOL, TRIG, HDL, CHOLHDL, VLDL, LDLCALC, LDLDIRECT  Physical Exam:    VS:  BP (!) 106/52    Pulse 84    Ht 5\' 4"  (1.626 m)    Wt 174 lb 6.4 oz (79.1 kg)    SpO2 97%    BMI 29.94 kg/m     Wt Readings from Last 3 Encounters:  05/19/21 174 lb 6.4 oz (79.1 kg)  04/17/21 192 lb (87.1 kg)  03/17/21 202 lb (91.6 kg)     GEN: Patient is in no acute distress HEENT: Normal NECK: No JVD; No carotid bruits LYMPHATICS: No lymphadenopathy CARDIAC: Hear sounds regular, 2/6 systolic murmur at the apex. RESPIRATORY:  Clear to auscultation without rales, wheezing or rhonchi  ABDOMEN: Soft, non-tender, non-distended MUSCULOSKELETAL:  No edema; No deformity  SKIN: Warm and dry NEUROLOGIC:  Alert and oriented x 3 PSYCHIATRIC:  Normal affect   Signed, Jenean Lindau, MD  05/19/2021 1:47 PM    [facility]

## 2021-05-25 NOTE — Progress Notes (Signed)
[facility]  [address] [place],  [place]  [address] [phone]  Clinic Day:  05/29/2021  Referring physician: Nicholos Johns, MD  This document serves as a record of services personally performed by Marice Potter, MD. It was created on their behalf by [facility] E, a trained medical scribe. The creation of this record is based on the scribe's personal observations and the provider's statements to them.  HISTORY OF PRESENT ILLNESS:  The patient is a 76 y.o. female with essential thrombocythemia.  As her platelet count has never risen above 600,000, her disease has been followed conservatively.  She comes in today for routine follow up.  Since her last visit, the patient has been doing okay.  Of note, she was recently hospitalized for congestive heart failure.  She claims much as 50 pounds of fluid has been removed from her over these past few months due to her severe congestive heart failure.  As it pertains to her underlying essential thrombocythemia, she denies having any clotting complications.  PHYSICAL EXAM:  Blood pressure (!) 148/68, pulse (!) 55, temperature 97.6 F (36.4 C), temperature source Oral, resp. rate 17, weight 171 lb 6.4 oz (77.7 kg), SpO2 97 %. Wt Readings from Last 3 Encounters:  05/29/21 171 lb 6.4 oz (77.7 kg)  05/19/21 174 lb 6.4 oz (79.1 kg)  04/17/21 192 lb (87.1 kg)   Body mass index is 29.42 kg/m. Performance status (ECOG): 1 Physical Exam Constitutional:      Appearance: Normal appearance. She is not ill-appearing.     Comments: She is ambulating with a cane  HENT:     Mouth/Throat:     Mouth: Mucous membranes are moist.     Pharynx: Oropharynx is clear. No oropharyngeal exudate or posterior oropharyngeal erythema.  Cardiovascular:     Rate and Rhythm: Regular rhythm. Bradycardia present.     Heart sounds: No murmur heard.   No friction rub. No gallop.  Pulmonary:     Effort: Pulmonary effort is normal. No  respiratory distress.     Breath sounds: Normal breath sounds. No wheezing, rhonchi or rales.  Abdominal:     General: Bowel sounds are normal. There is no distension.     Palpations: Abdomen is soft. There is no mass.     Tenderness: There is no abdominal tenderness.  Musculoskeletal:        General: No swelling.     Right lower leg: No edema.     Left lower leg: No edema.  Lymphadenopathy:     Cervical: No cervical adenopathy.     Upper Body:     Right upper body: No supraclavicular or axillary adenopathy.     Left upper body: No supraclavicular or axillary adenopathy.     Lower Body: No right inguinal adenopathy. No left inguinal adenopathy.  Skin:    General: Skin is warm.     Coloration: Skin is not jaundiced.     Findings: No lesion or rash.  Neurological:     General: No focal deficit present.     Mental Status: She is alert and oriented to person, place, and time. Mental status is at baseline.  Psychiatric:        Mood and Affect: Mood normal.        Behavior: Behavior normal.        Thought Content: Thought content normal.   LABS:   CBC Latest Ref Rng & Units 05/29/2021 11/27/2020 05/23/2020  WBC - 8.1  9.7 10.2  Hemoglobin 12.0 - 16.0 13.2 12.1 12.0  Hematocrit 36 - 46 40 37 36  Platelets 150 - 399 313 462(A) 440(A)   ASSESSMENT & PLAN:  Assessment/Plan:  A 76 y.o. female with essential thrombocythemia.  Once again, her platelet count remains well below 600,000 to where no type of cytoreductive therapy needs to be considered.  She knows to continue taking a baby aspirin on a daily basis to offset any potential clotting complications from her underlying myeloproliferative disorder.  Otherwise, as she is doing very well, I will see her back in 6 months for repeat clinical assessment.  The patient understands all the plans discussed today and is in agreement with them.     I, Rita Ohara, am acting as scribe for Marice Potter, MD    I have reviewed this report as typed  by the medical scribe, and it is complete and accurate.  Ausar Georgiou Macarthur Critchley, MD

## 2021-05-29 ENCOUNTER — Encounter: Payer: Self-pay | Admitting: Oncology

## 2021-05-29 ENCOUNTER — Other Ambulatory Visit: Payer: Self-pay

## 2021-05-29 ENCOUNTER — Other Ambulatory Visit: Payer: Self-pay | Admitting: Hematology and Oncology

## 2021-05-29 ENCOUNTER — Telehealth: Payer: Self-pay | Admitting: Oncology

## 2021-05-29 ENCOUNTER — Inpatient Hospital Stay: Payer: PPO | Attending: Oncology

## 2021-05-29 ENCOUNTER — Other Ambulatory Visit: Payer: Self-pay | Admitting: Oncology

## 2021-05-29 ENCOUNTER — Inpatient Hospital Stay (INDEPENDENT_AMBULATORY_CARE_PROVIDER_SITE_OTHER): Payer: PPO | Admitting: Oncology

## 2021-05-29 VITALS — BP 148/68 | HR 55 | Temp 97.6°F | Resp 17 | Wt 171.4 lb

## 2021-05-29 DIAGNOSIS — D473 Essential (hemorrhagic) thrombocythemia: Secondary | ICD-10-CM

## 2021-05-29 LAB — CBC AND DIFFERENTIAL
HCT: 40 (ref 36–46)
Hemoglobin: 13.2 (ref 12.0–16.0)
Neutrophils Absolute: 4.21
Platelets: 313 (ref 150–399)
WBC: 8.1

## 2021-05-29 LAB — CBC: RBC: 4.95 (ref 3.87–5.11)

## 2021-05-29 NOTE — Telephone Encounter (Signed)
Patient has been scheduled for follow-up visit per 05/29/21 los. Pt given an appt calendar with date and time.

## 2021-05-29 NOTE — Progress Notes (Signed)
Patient here for oncology follow-up appointment, states she was recently hospitalized for HF

## 2021-06-02 DIAGNOSIS — M25561 Pain in right knee: Secondary | ICD-10-CM | POA: Diagnosis not present

## 2021-06-12 DIAGNOSIS — M25561 Pain in right knee: Secondary | ICD-10-CM | POA: Diagnosis not present

## 2021-06-12 DIAGNOSIS — G8929 Other chronic pain: Secondary | ICD-10-CM

## 2021-06-12 DIAGNOSIS — M546 Pain in thoracic spine: Secondary | ICD-10-CM | POA: Diagnosis not present

## 2021-06-12 DIAGNOSIS — M961 Postlaminectomy syndrome, not elsewhere classified: Secondary | ICD-10-CM | POA: Diagnosis not present

## 2021-06-12 DIAGNOSIS — G894 Chronic pain syndrome: Secondary | ICD-10-CM | POA: Diagnosis not present

## 2021-06-12 HISTORY — DX: Other chronic pain: G89.29

## 2021-06-16 DIAGNOSIS — I10 Essential (primary) hypertension: Secondary | ICD-10-CM | POA: Diagnosis not present

## 2021-06-16 DIAGNOSIS — E782 Mixed hyperlipidemia: Secondary | ICD-10-CM | POA: Diagnosis not present

## 2021-07-08 ENCOUNTER — Ambulatory Visit: Payer: PPO | Admitting: Cardiology

## 2021-07-28 DIAGNOSIS — Z6827 Body mass index (BMI) 27.0-27.9, adult: Secondary | ICD-10-CM | POA: Diagnosis not present

## 2021-07-28 DIAGNOSIS — L659 Nonscarring hair loss, unspecified: Secondary | ICD-10-CM | POA: Diagnosis not present

## 2021-07-28 DIAGNOSIS — Z79899 Other long term (current) drug therapy: Secondary | ICD-10-CM | POA: Diagnosis not present

## 2021-07-29 DIAGNOSIS — M25561 Pain in right knee: Secondary | ICD-10-CM | POA: Diagnosis not present

## 2021-07-29 DIAGNOSIS — G8929 Other chronic pain: Secondary | ICD-10-CM | POA: Diagnosis not present

## 2021-08-04 DIAGNOSIS — T8484XD Pain due to internal orthopedic prosthetic devices, implants and grafts, subsequent encounter: Secondary | ICD-10-CM | POA: Diagnosis not present

## 2021-08-04 DIAGNOSIS — Z96651 Presence of right artificial knee joint: Secondary | ICD-10-CM | POA: Diagnosis not present

## 2021-08-11 DIAGNOSIS — M25561 Pain in right knee: Secondary | ICD-10-CM | POA: Diagnosis not present

## 2021-08-11 DIAGNOSIS — T8484XD Pain due to internal orthopedic prosthetic devices, implants and grafts, subsequent encounter: Secondary | ICD-10-CM | POA: Diagnosis not present

## 2021-08-11 DIAGNOSIS — Z96653 Presence of artificial knee joint, bilateral: Secondary | ICD-10-CM | POA: Diagnosis not present

## 2021-08-11 DIAGNOSIS — Z96651 Presence of right artificial knee joint: Secondary | ICD-10-CM | POA: Diagnosis not present

## 2021-08-13 ENCOUNTER — Telehealth: Payer: Self-pay | Admitting: Cardiology

## 2021-08-13 MED ORDER — FUROSEMIDE 40 MG PO TABS: 40.0000 mg | ORAL_TABLET | Freq: Two times a day (BID) | ORAL | 2 refills | Status: DC

## 2021-08-13 NOTE — Addendum Note (Signed)
Addended by: Truddie Hidden on: 08/13/2021 05:02 PM ? ? Modules accepted: Orders ? ?

## 2021-08-13 NOTE — Telephone Encounter (Signed)
Recommendations reviewed with pt as per Dr. Revankar's note.  Pt verbalized understanding and had no additional questions.   

## 2021-08-13 NOTE — Telephone Encounter (Signed)
Pt had been taking Lasix 40 mg BID and states that when she got her last refill it appears Dr. Rica Records sent a RX and changed it to ?Authorized by: Nicholos Johns ?Dispense note: ORIGINAL ENI:DPOE 1 TABLET BY MOUTH IN THE MORNING (MAY TAKE AN ADDITIONAL TABLET IN THE EVENING AS NEEDED FOR LEG SWELLING) MAX DAILY DOSE: 2 TABLETS. ? ?Pt states that she done better when it was 40 mg BID. BP has been 136/65 to 165/75. Please advise ?

## 2021-08-13 NOTE — Telephone Encounter (Signed)
Pt c/o swelling: STAT is pt has developed SOB within 24 hours ? ?If swelling, where is the swelling located? In legs ? ?How much weight have you gained and in what time span? 8 lbs in about 3 weeks ? ?Have you gained 3 pounds in a day or 5 pounds in a week? no ? ?Do you have a log of your daily weights (if so, list)? 171, 169, 172 lbs the last 3 days ? ?Are you currently taking a fluid pill? yes ? ?Are you currently SOB? no ? ?Have you traveled recently? No ? ? ?Patient states she has gained 8 lbs in 3 weeks and has swelling in her legs. ? ?

## 2021-08-16 DIAGNOSIS — E782 Mixed hyperlipidemia: Secondary | ICD-10-CM | POA: Diagnosis not present

## 2021-08-16 DIAGNOSIS — K219 Gastro-esophageal reflux disease without esophagitis: Secondary | ICD-10-CM | POA: Diagnosis not present

## 2021-08-16 DIAGNOSIS — I10 Essential (primary) hypertension: Secondary | ICD-10-CM | POA: Diagnosis not present

## 2021-08-18 DIAGNOSIS — L638 Other alopecia areata: Secondary | ICD-10-CM | POA: Diagnosis not present

## 2021-09-07 DIAGNOSIS — G894 Chronic pain syndrome: Secondary | ICD-10-CM | POA: Diagnosis not present

## 2021-09-07 DIAGNOSIS — G8929 Other chronic pain: Secondary | ICD-10-CM | POA: Diagnosis not present

## 2021-09-07 DIAGNOSIS — Z79899 Other long term (current) drug therapy: Secondary | ICD-10-CM | POA: Diagnosis not present

## 2021-09-07 DIAGNOSIS — M25561 Pain in right knee: Secondary | ICD-10-CM | POA: Diagnosis not present

## 2021-09-07 DIAGNOSIS — M961 Postlaminectomy syndrome, not elsewhere classified: Secondary | ICD-10-CM | POA: Diagnosis not present

## 2021-09-07 DIAGNOSIS — M792 Neuralgia and neuritis, unspecified: Secondary | ICD-10-CM | POA: Diagnosis not present

## 2021-09-07 DIAGNOSIS — M5414 Radiculopathy, thoracic region: Secondary | ICD-10-CM | POA: Diagnosis not present

## 2021-09-08 ENCOUNTER — Other Ambulatory Visit: Payer: Self-pay

## 2021-09-08 DIAGNOSIS — H26493 Other secondary cataract, bilateral: Secondary | ICD-10-CM | POA: Diagnosis not present

## 2021-09-08 DIAGNOSIS — H524 Presbyopia: Secondary | ICD-10-CM | POA: Diagnosis not present

## 2021-09-08 DIAGNOSIS — H18513 Endothelial corneal dystrophy, bilateral: Secondary | ICD-10-CM | POA: Diagnosis not present

## 2021-09-10 ENCOUNTER — Ambulatory Visit: Payer: PPO | Admitting: Cardiology

## 2021-09-17 DIAGNOSIS — T8484XD Pain due to internal orthopedic prosthetic devices, implants and grafts, subsequent encounter: Secondary | ICD-10-CM | POA: Diagnosis not present

## 2021-09-17 DIAGNOSIS — Z96651 Presence of right artificial knee joint: Secondary | ICD-10-CM | POA: Diagnosis not present

## 2021-09-22 DIAGNOSIS — G894 Chronic pain syndrome: Secondary | ICD-10-CM | POA: Diagnosis not present

## 2021-09-22 DIAGNOSIS — M961 Postlaminectomy syndrome, not elsewhere classified: Secondary | ICD-10-CM | POA: Diagnosis not present

## 2021-09-22 DIAGNOSIS — M25561 Pain in right knee: Secondary | ICD-10-CM | POA: Diagnosis not present

## 2021-09-22 DIAGNOSIS — M797 Fibromyalgia: Secondary | ICD-10-CM | POA: Diagnosis not present

## 2021-10-06 DIAGNOSIS — E559 Vitamin D deficiency, unspecified: Secondary | ICD-10-CM | POA: Diagnosis not present

## 2021-10-06 DIAGNOSIS — I10 Essential (primary) hypertension: Secondary | ICD-10-CM | POA: Diagnosis not present

## 2021-10-06 DIAGNOSIS — Z8679 Personal history of other diseases of the circulatory system: Secondary | ICD-10-CM | POA: Diagnosis not present

## 2021-10-06 DIAGNOSIS — E039 Hypothyroidism, unspecified: Secondary | ICD-10-CM | POA: Diagnosis not present

## 2021-10-06 DIAGNOSIS — M810 Age-related osteoporosis without current pathological fracture: Secondary | ICD-10-CM | POA: Diagnosis not present

## 2021-10-06 DIAGNOSIS — Z9181 History of falling: Secondary | ICD-10-CM | POA: Diagnosis not present

## 2021-10-06 DIAGNOSIS — G8929 Other chronic pain: Secondary | ICD-10-CM | POA: Diagnosis not present

## 2021-10-06 DIAGNOSIS — R413 Other amnesia: Secondary | ICD-10-CM | POA: Diagnosis not present

## 2021-10-06 DIAGNOSIS — J309 Allergic rhinitis, unspecified: Secondary | ICD-10-CM | POA: Diagnosis not present

## 2021-10-06 DIAGNOSIS — K219 Gastro-esophageal reflux disease without esophagitis: Secondary | ICD-10-CM | POA: Diagnosis not present

## 2021-10-06 DIAGNOSIS — M7989 Other specified soft tissue disorders: Secondary | ICD-10-CM | POA: Diagnosis not present

## 2021-10-06 DIAGNOSIS — E785 Hyperlipidemia, unspecified: Secondary | ICD-10-CM | POA: Diagnosis not present

## 2021-10-13 ENCOUNTER — Encounter: Payer: Self-pay | Admitting: Cardiology

## 2021-10-13 ENCOUNTER — Ambulatory Visit: Payer: PPO | Admitting: Cardiology

## 2021-10-13 VITALS — BP 95/56 | HR 64 | Ht 64.0 in | Wt 183.0 lb

## 2021-10-13 DIAGNOSIS — I509 Heart failure, unspecified: Secondary | ICD-10-CM | POA: Diagnosis not present

## 2021-10-13 DIAGNOSIS — E669 Obesity, unspecified: Secondary | ICD-10-CM

## 2021-10-13 DIAGNOSIS — I351 Nonrheumatic aortic (valve) insufficiency: Secondary | ICD-10-CM

## 2021-10-13 DIAGNOSIS — R413 Other amnesia: Secondary | ICD-10-CM | POA: Diagnosis not present

## 2021-10-13 DIAGNOSIS — I10 Essential (primary) hypertension: Secondary | ICD-10-CM | POA: Diagnosis not present

## 2021-10-13 DIAGNOSIS — E66811 Obesity, class 1: Secondary | ICD-10-CM

## 2021-10-13 HISTORY — DX: Obesity, unspecified: E66.9

## 2021-10-13 HISTORY — DX: Obesity, class 1: E66.811

## 2021-10-21 DIAGNOSIS — Z6829 Body mass index (BMI) 29.0-29.9, adult: Secondary | ICD-10-CM | POA: Diagnosis not present

## 2021-10-21 DIAGNOSIS — M25532 Pain in left wrist: Secondary | ICD-10-CM | POA: Diagnosis not present

## 2021-10-21 DIAGNOSIS — E663 Overweight: Secondary | ICD-10-CM | POA: Diagnosis not present

## 2021-10-21 DIAGNOSIS — Z9181 History of falling: Secondary | ICD-10-CM | POA: Diagnosis not present

## 2021-10-21 DIAGNOSIS — M25561 Pain in right knee: Secondary | ICD-10-CM | POA: Diagnosis not present

## 2021-10-21 DIAGNOSIS — M25461 Effusion, right knee: Secondary | ICD-10-CM | POA: Diagnosis not present

## 2021-10-21 DIAGNOSIS — S0993XA Unspecified injury of face, initial encounter: Secondary | ICD-10-CM | POA: Diagnosis not present

## 2021-10-22 DIAGNOSIS — M25461 Effusion, right knee: Secondary | ICD-10-CM | POA: Diagnosis not present

## 2021-10-22 DIAGNOSIS — S6992XA Unspecified injury of left wrist, hand and finger(s), initial encounter: Secondary | ICD-10-CM | POA: Diagnosis not present

## 2021-10-22 DIAGNOSIS — M25561 Pain in right knee: Secondary | ICD-10-CM | POA: Diagnosis not present

## 2021-10-22 DIAGNOSIS — M25532 Pain in left wrist: Secondary | ICD-10-CM | POA: Diagnosis not present

## 2021-10-22 DIAGNOSIS — S82001A Unspecified fracture of right patella, initial encounter for closed fracture: Secondary | ICD-10-CM | POA: Diagnosis not present

## 2021-10-22 DIAGNOSIS — Z043 Encounter for examination and observation following other accident: Secondary | ICD-10-CM | POA: Diagnosis not present

## 2021-10-22 DIAGNOSIS — S0993XA Unspecified injury of face, initial encounter: Secondary | ICD-10-CM | POA: Diagnosis not present

## 2021-10-22 DIAGNOSIS — M1612 Unilateral primary osteoarthritis, left hip: Secondary | ICD-10-CM | POA: Diagnosis not present

## 2021-10-22 DIAGNOSIS — R519 Headache, unspecified: Secondary | ICD-10-CM | POA: Diagnosis not present

## 2021-10-27 ENCOUNTER — Ambulatory Visit (INDEPENDENT_AMBULATORY_CARE_PROVIDER_SITE_OTHER): Payer: PPO

## 2021-10-27 DIAGNOSIS — I509 Heart failure, unspecified: Secondary | ICD-10-CM

## 2021-10-27 DIAGNOSIS — I351 Nonrheumatic aortic (valve) insufficiency: Secondary | ICD-10-CM | POA: Diagnosis not present

## 2021-10-27 LAB — ECHOCARDIOGRAM COMPLETE
P 1/2 time: 468 msec
S' Lateral: 2.8 cm

## 2021-11-03 DIAGNOSIS — G894 Chronic pain syndrome: Secondary | ICD-10-CM | POA: Diagnosis not present

## 2021-11-03 DIAGNOSIS — M533 Sacrococcygeal disorders, not elsewhere classified: Secondary | ICD-10-CM | POA: Diagnosis not present

## 2021-11-03 DIAGNOSIS — M961 Postlaminectomy syndrome, not elsewhere classified: Secondary | ICD-10-CM | POA: Diagnosis not present

## 2021-11-10 ENCOUNTER — Ambulatory Visit: Payer: PPO | Admitting: Cardiology

## 2021-11-18 DIAGNOSIS — Z6829 Body mass index (BMI) 29.0-29.9, adult: Secondary | ICD-10-CM | POA: Diagnosis not present

## 2021-11-18 DIAGNOSIS — M797 Fibromyalgia: Secondary | ICD-10-CM | POA: Diagnosis not present

## 2021-11-19 DIAGNOSIS — M797 Fibromyalgia: Secondary | ICD-10-CM | POA: Diagnosis not present

## 2021-11-24 ENCOUNTER — Other Ambulatory Visit: Payer: PPO

## 2021-11-24 ENCOUNTER — Ambulatory Visit: Payer: PPO | Admitting: Oncology

## 2021-11-26 ENCOUNTER — Ambulatory Visit: Payer: PPO | Admitting: Oncology

## 2021-11-26 ENCOUNTER — Other Ambulatory Visit: Payer: PPO

## 2021-12-17 DIAGNOSIS — I10 Essential (primary) hypertension: Secondary | ICD-10-CM | POA: Diagnosis not present

## 2021-12-17 DIAGNOSIS — E782 Mixed hyperlipidemia: Secondary | ICD-10-CM | POA: Diagnosis not present

## 2021-12-23 DIAGNOSIS — Z0000 Encounter for general adult medical examination without abnormal findings: Secondary | ICD-10-CM | POA: Diagnosis not present

## 2021-12-23 DIAGNOSIS — Z1331 Encounter for screening for depression: Secondary | ICD-10-CM | POA: Diagnosis not present

## 2021-12-23 DIAGNOSIS — E785 Hyperlipidemia, unspecified: Secondary | ICD-10-CM | POA: Diagnosis not present

## 2021-12-29 DIAGNOSIS — R251 Tremor, unspecified: Secondary | ICD-10-CM | POA: Insufficient documentation

## 2021-12-29 DIAGNOSIS — M961 Postlaminectomy syndrome, not elsewhere classified: Secondary | ICD-10-CM | POA: Diagnosis not present

## 2021-12-29 DIAGNOSIS — G894 Chronic pain syndrome: Secondary | ICD-10-CM | POA: Diagnosis not present

## 2021-12-29 DIAGNOSIS — F32A Depression, unspecified: Secondary | ICD-10-CM | POA: Diagnosis not present

## 2021-12-29 HISTORY — DX: Tremor, unspecified: R25.1

## 2021-12-30 DIAGNOSIS — G250 Essential tremor: Secondary | ICD-10-CM | POA: Diagnosis not present

## 2021-12-30 DIAGNOSIS — Z6829 Body mass index (BMI) 29.0-29.9, adult: Secondary | ICD-10-CM | POA: Diagnosis not present

## 2022-01-19 DIAGNOSIS — Z8679 Personal history of other diseases of the circulatory system: Secondary | ICD-10-CM | POA: Diagnosis not present

## 2022-01-19 DIAGNOSIS — Z9181 History of falling: Secondary | ICD-10-CM | POA: Diagnosis not present

## 2022-01-19 DIAGNOSIS — M7989 Other specified soft tissue disorders: Secondary | ICD-10-CM | POA: Diagnosis not present

## 2022-01-19 DIAGNOSIS — E559 Vitamin D deficiency, unspecified: Secondary | ICD-10-CM | POA: Diagnosis not present

## 2022-01-19 DIAGNOSIS — E785 Hyperlipidemia, unspecified: Secondary | ICD-10-CM | POA: Diagnosis not present

## 2022-01-19 DIAGNOSIS — I10 Essential (primary) hypertension: Secondary | ICD-10-CM | POA: Diagnosis not present

## 2022-01-19 DIAGNOSIS — G250 Essential tremor: Secondary | ICD-10-CM | POA: Diagnosis not present

## 2022-01-19 DIAGNOSIS — E039 Hypothyroidism, unspecified: Secondary | ICD-10-CM | POA: Diagnosis not present

## 2022-01-19 DIAGNOSIS — R413 Other amnesia: Secondary | ICD-10-CM | POA: Diagnosis not present

## 2022-01-19 DIAGNOSIS — M810 Age-related osteoporosis without current pathological fracture: Secondary | ICD-10-CM | POA: Diagnosis not present

## 2022-01-19 DIAGNOSIS — J309 Allergic rhinitis, unspecified: Secondary | ICD-10-CM | POA: Diagnosis not present

## 2022-01-19 DIAGNOSIS — K219 Gastro-esophageal reflux disease without esophagitis: Secondary | ICD-10-CM | POA: Diagnosis not present

## 2022-01-21 DIAGNOSIS — E559 Vitamin D deficiency, unspecified: Secondary | ICD-10-CM | POA: Diagnosis not present

## 2022-01-21 DIAGNOSIS — E785 Hyperlipidemia, unspecified: Secondary | ICD-10-CM | POA: Diagnosis not present

## 2022-01-21 DIAGNOSIS — Z79899 Other long term (current) drug therapy: Secondary | ICD-10-CM | POA: Diagnosis not present

## 2022-02-24 DIAGNOSIS — Z6829 Body mass index (BMI) 29.0-29.9, adult: Secondary | ICD-10-CM | POA: Diagnosis not present

## 2022-02-24 DIAGNOSIS — M5442 Lumbago with sciatica, left side: Secondary | ICD-10-CM | POA: Diagnosis not present

## 2022-03-02 DIAGNOSIS — G250 Essential tremor: Secondary | ICD-10-CM | POA: Diagnosis not present

## 2022-03-02 DIAGNOSIS — Z820 Family history of epilepsy and other diseases of the nervous system: Secondary | ICD-10-CM | POA: Diagnosis not present

## 2022-03-02 DIAGNOSIS — R600 Localized edema: Secondary | ICD-10-CM | POA: Diagnosis not present

## 2022-03-02 DIAGNOSIS — R29898 Other symptoms and signs involving the musculoskeletal system: Secondary | ICD-10-CM | POA: Diagnosis not present

## 2022-03-02 DIAGNOSIS — F419 Anxiety disorder, unspecified: Secondary | ICD-10-CM | POA: Diagnosis not present

## 2022-03-02 DIAGNOSIS — R251 Tremor, unspecified: Secondary | ICD-10-CM | POA: Diagnosis not present

## 2022-03-02 DIAGNOSIS — Z79899 Other long term (current) drug therapy: Secondary | ICD-10-CM | POA: Diagnosis not present

## 2022-03-02 DIAGNOSIS — F32A Depression, unspecified: Secondary | ICD-10-CM | POA: Diagnosis not present

## 2022-03-18 DIAGNOSIS — Z23 Encounter for immunization: Secondary | ICD-10-CM | POA: Diagnosis not present

## 2022-03-18 DIAGNOSIS — M5442 Lumbago with sciatica, left side: Secondary | ICD-10-CM | POA: Diagnosis not present

## 2022-03-30 DIAGNOSIS — M961 Postlaminectomy syndrome, not elsewhere classified: Secondary | ICD-10-CM | POA: Diagnosis not present

## 2022-03-30 DIAGNOSIS — M47814 Spondylosis without myelopathy or radiculopathy, thoracic region: Secondary | ICD-10-CM | POA: Diagnosis not present

## 2022-03-30 DIAGNOSIS — G894 Chronic pain syndrome: Secondary | ICD-10-CM | POA: Diagnosis not present

## 2022-03-30 DIAGNOSIS — M5416 Radiculopathy, lumbar region: Secondary | ICD-10-CM | POA: Diagnosis not present

## 2022-03-30 DIAGNOSIS — Z79899 Other long term (current) drug therapy: Secondary | ICD-10-CM | POA: Diagnosis not present

## 2022-04-14 DIAGNOSIS — M48061 Spinal stenosis, lumbar region without neurogenic claudication: Secondary | ICD-10-CM | POA: Diagnosis not present

## 2022-04-14 DIAGNOSIS — M4316 Spondylolisthesis, lumbar region: Secondary | ICD-10-CM | POA: Diagnosis not present

## 2022-04-14 DIAGNOSIS — M5416 Radiculopathy, lumbar region: Secondary | ICD-10-CM | POA: Diagnosis not present

## 2022-04-14 DIAGNOSIS — M545 Low back pain, unspecified: Secondary | ICD-10-CM | POA: Diagnosis not present

## 2022-04-14 DIAGNOSIS — M4726 Other spondylosis with radiculopathy, lumbar region: Secondary | ICD-10-CM | POA: Diagnosis not present

## 2022-04-21 DIAGNOSIS — E039 Hypothyroidism, unspecified: Secondary | ICD-10-CM | POA: Diagnosis not present

## 2022-04-21 DIAGNOSIS — M5440 Lumbago with sciatica, unspecified side: Secondary | ICD-10-CM | POA: Diagnosis not present

## 2022-04-21 DIAGNOSIS — E785 Hyperlipidemia, unspecified: Secondary | ICD-10-CM | POA: Diagnosis not present

## 2022-04-21 DIAGNOSIS — G250 Essential tremor: Secondary | ICD-10-CM | POA: Diagnosis not present

## 2022-04-21 DIAGNOSIS — E663 Overweight: Secondary | ICD-10-CM | POA: Diagnosis not present

## 2022-04-21 DIAGNOSIS — I10 Essential (primary) hypertension: Secondary | ICD-10-CM | POA: Diagnosis not present

## 2022-04-21 DIAGNOSIS — Z6829 Body mass index (BMI) 29.0-29.9, adult: Secondary | ICD-10-CM | POA: Diagnosis not present

## 2022-04-28 ENCOUNTER — Other Ambulatory Visit: Payer: Self-pay | Admitting: Cardiology

## 2022-05-05 DIAGNOSIS — E039 Hypothyroidism, unspecified: Secondary | ICD-10-CM | POA: Diagnosis not present

## 2022-05-05 DIAGNOSIS — K219 Gastro-esophageal reflux disease without esophagitis: Secondary | ICD-10-CM | POA: Diagnosis not present

## 2022-05-05 DIAGNOSIS — M7989 Other specified soft tissue disorders: Secondary | ICD-10-CM | POA: Diagnosis not present

## 2022-05-05 DIAGNOSIS — E785 Hyperlipidemia, unspecified: Secondary | ICD-10-CM | POA: Diagnosis not present

## 2022-05-05 DIAGNOSIS — G8929 Other chronic pain: Secondary | ICD-10-CM | POA: Diagnosis not present

## 2022-05-05 DIAGNOSIS — E559 Vitamin D deficiency, unspecified: Secondary | ICD-10-CM | POA: Diagnosis not present

## 2022-05-05 DIAGNOSIS — I10 Essential (primary) hypertension: Secondary | ICD-10-CM | POA: Diagnosis not present

## 2022-05-05 DIAGNOSIS — Z8679 Personal history of other diseases of the circulatory system: Secondary | ICD-10-CM | POA: Diagnosis not present

## 2022-05-05 DIAGNOSIS — R413 Other amnesia: Secondary | ICD-10-CM | POA: Diagnosis not present

## 2022-05-05 DIAGNOSIS — G250 Essential tremor: Secondary | ICD-10-CM | POA: Diagnosis not present

## 2022-05-05 DIAGNOSIS — M810 Age-related osteoporosis without current pathological fracture: Secondary | ICD-10-CM | POA: Diagnosis not present

## 2022-05-05 DIAGNOSIS — J309 Allergic rhinitis, unspecified: Secondary | ICD-10-CM | POA: Diagnosis not present

## 2022-05-12 DIAGNOSIS — R600 Localized edema: Secondary | ICD-10-CM | POA: Diagnosis not present

## 2022-05-12 DIAGNOSIS — Z79899 Other long term (current) drug therapy: Secondary | ICD-10-CM | POA: Diagnosis not present

## 2022-05-12 DIAGNOSIS — R251 Tremor, unspecified: Secondary | ICD-10-CM | POA: Diagnosis not present

## 2022-05-17 DIAGNOSIS — M7989 Other specified soft tissue disorders: Secondary | ICD-10-CM | POA: Diagnosis not present

## 2022-05-17 DIAGNOSIS — I872 Venous insufficiency (chronic) (peripheral): Secondary | ICD-10-CM | POA: Diagnosis not present

## 2022-05-17 DIAGNOSIS — L03116 Cellulitis of left lower limb: Secondary | ICD-10-CM | POA: Diagnosis not present

## 2022-05-17 DIAGNOSIS — L03115 Cellulitis of right lower limb: Secondary | ICD-10-CM | POA: Diagnosis not present

## 2022-05-24 ENCOUNTER — Other Ambulatory Visit: Payer: Self-pay | Admitting: Cardiology

## 2022-05-27 DIAGNOSIS — R519 Headache, unspecified: Secondary | ICD-10-CM | POA: Diagnosis not present

## 2022-05-27 DIAGNOSIS — Z471 Aftercare following joint replacement surgery: Secondary | ICD-10-CM | POA: Diagnosis not present

## 2022-05-27 DIAGNOSIS — Z96651 Presence of right artificial knee joint: Secondary | ICD-10-CM | POA: Diagnosis not present

## 2022-05-27 DIAGNOSIS — I739 Peripheral vascular disease, unspecified: Secondary | ICD-10-CM | POA: Diagnosis not present

## 2022-05-27 DIAGNOSIS — M25461 Effusion, right knee: Secondary | ICD-10-CM | POA: Diagnosis not present

## 2022-05-27 DIAGNOSIS — M25561 Pain in right knee: Secondary | ICD-10-CM | POA: Diagnosis not present

## 2022-05-27 DIAGNOSIS — E559 Vitamin D deficiency, unspecified: Secondary | ICD-10-CM | POA: Diagnosis not present

## 2022-05-27 DIAGNOSIS — Z79899 Other long term (current) drug therapy: Secondary | ICD-10-CM | POA: Diagnosis not present

## 2022-05-27 DIAGNOSIS — M8448XA Pathological fracture, other site, initial encounter for fracture: Secondary | ICD-10-CM | POA: Diagnosis not present

## 2022-05-28 DIAGNOSIS — M25561 Pain in right knee: Secondary | ICD-10-CM | POA: Diagnosis not present

## 2022-05-28 DIAGNOSIS — R110 Nausea: Secondary | ICD-10-CM | POA: Diagnosis not present

## 2022-05-28 DIAGNOSIS — I4891 Unspecified atrial fibrillation: Secondary | ICD-10-CM | POA: Diagnosis not present

## 2022-05-28 DIAGNOSIS — R5383 Other fatigue: Secondary | ICD-10-CM | POA: Diagnosis not present

## 2022-05-28 DIAGNOSIS — R519 Headache, unspecified: Secondary | ICD-10-CM | POA: Diagnosis not present

## 2022-05-28 DIAGNOSIS — R079 Chest pain, unspecified: Secondary | ICD-10-CM | POA: Diagnosis not present

## 2022-05-28 DIAGNOSIS — I482 Chronic atrial fibrillation, unspecified: Secondary | ICD-10-CM | POA: Diagnosis not present

## 2022-05-28 DIAGNOSIS — G8929 Other chronic pain: Secondary | ICD-10-CM | POA: Diagnosis not present

## 2022-05-28 DIAGNOSIS — I509 Heart failure, unspecified: Secondary | ICD-10-CM | POA: Diagnosis not present

## 2022-05-28 DIAGNOSIS — I110 Hypertensive heart disease with heart failure: Secondary | ICD-10-CM | POA: Diagnosis not present

## 2022-05-29 DIAGNOSIS — Z20822 Contact with and (suspected) exposure to covid-19: Secondary | ICD-10-CM | POA: Diagnosis not present

## 2022-05-29 DIAGNOSIS — M542 Cervicalgia: Secondary | ICD-10-CM | POA: Diagnosis not present

## 2022-05-29 DIAGNOSIS — M5030 Other cervical disc degeneration, unspecified cervical region: Secondary | ICD-10-CM | POA: Diagnosis not present

## 2022-05-29 DIAGNOSIS — R9431 Abnormal electrocardiogram [ECG] [EKG]: Secondary | ICD-10-CM | POA: Diagnosis not present

## 2022-05-29 DIAGNOSIS — I4891 Unspecified atrial fibrillation: Secondary | ICD-10-CM | POA: Diagnosis not present

## 2022-05-29 DIAGNOSIS — R519 Headache, unspecified: Secondary | ICD-10-CM | POA: Diagnosis not present

## 2022-05-30 DIAGNOSIS — I4891 Unspecified atrial fibrillation: Secondary | ICD-10-CM | POA: Diagnosis not present

## 2022-05-31 ENCOUNTER — Telehealth: Payer: Self-pay | Admitting: Cardiology

## 2022-05-31 DIAGNOSIS — I739 Peripheral vascular disease, unspecified: Secondary | ICD-10-CM | POA: Diagnosis not present

## 2022-05-31 DIAGNOSIS — M25561 Pain in right knee: Secondary | ICD-10-CM | POA: Diagnosis not present

## 2022-05-31 DIAGNOSIS — M25461 Effusion, right knee: Secondary | ICD-10-CM | POA: Diagnosis not present

## 2022-05-31 DIAGNOSIS — Z09 Encounter for follow-up examination after completed treatment for conditions other than malignant neoplasm: Secondary | ICD-10-CM | POA: Diagnosis not present

## 2022-05-31 DIAGNOSIS — R519 Headache, unspecified: Secondary | ICD-10-CM | POA: Diagnosis not present

## 2022-05-31 NOTE — Telephone Encounter (Signed)
RH chart printed.   LVM for pt to callback.

## 2022-05-31 NOTE — Telephone Encounter (Signed)
Pt is recently out of hospital due to swelling and migraine. She states that [employer] diagnosed her with afib and were able to pull liquid off of her. She says that her swelling still seems to be an issue and she does have other issues going, but would like a call back to discuss what her next steps should be.

## 2022-06-01 NOTE — Telephone Encounter (Signed)
Appointment made for 06/16/22 at 1:00.

## 2022-06-09 DIAGNOSIS — Z79899 Other long term (current) drug therapy: Secondary | ICD-10-CM | POA: Diagnosis not present

## 2022-06-10 ENCOUNTER — Other Ambulatory Visit: Payer: Self-pay

## 2022-06-15 ENCOUNTER — Telehealth: Payer: Self-pay | Admitting: Cardiology

## 2022-06-15 DIAGNOSIS — J029 Acute pharyngitis, unspecified: Secondary | ICD-10-CM | POA: Diagnosis not present

## 2022-06-15 DIAGNOSIS — M791 Myalgia, unspecified site: Secondary | ICD-10-CM | POA: Diagnosis not present

## 2022-06-15 NOTE — Telephone Encounter (Signed)
Pt states that she saw her PCP this am and was tested negative for all of the test noted. Pt states her PCP placed her on an antibiotic. Will call pt in the am to see how she feels, if no better we will reschedule.

## 2022-06-15 NOTE — Telephone Encounter (Signed)
Patient states she has body aches and a sore throat but has tested negative for the flu, strep throat, and COVID. She states she has not had any other symptoms and she would like to know if she can still come in for her appointment tomorrow, 2/28 with Dr. Geraldo Pitter. Patient says that she really needs to keep this appointment if at all possible, or if there is another date she can be seen soon. Please advise.

## 2022-06-16 ENCOUNTER — Encounter: Payer: Self-pay | Admitting: Cardiology

## 2022-06-16 ENCOUNTER — Ambulatory Visit (INDEPENDENT_AMBULATORY_CARE_PROVIDER_SITE_OTHER): Payer: PPO

## 2022-06-16 ENCOUNTER — Ambulatory Visit: Payer: PPO | Attending: Cardiology | Admitting: Cardiology

## 2022-06-16 VITALS — BP 172/70 | HR 58 | Ht 67.0 in | Wt 218.2 lb

## 2022-06-16 DIAGNOSIS — I10 Essential (primary) hypertension: Secondary | ICD-10-CM

## 2022-06-16 DIAGNOSIS — R002 Palpitations: Secondary | ICD-10-CM

## 2022-06-16 DIAGNOSIS — E669 Obesity, unspecified: Secondary | ICD-10-CM | POA: Diagnosis not present

## 2022-06-16 NOTE — Telephone Encounter (Signed)
Pt reports that she is feeling better. Advised to wear a mask for her appointment. Pt verbalized understanding and had no additional questions.

## 2022-06-16 NOTE — Patient Instructions (Signed)
Medication Instructions:  Your physician recommends that you continue on your current medications as directed. Please refer to the Current Medication list given to you today.  *If you need a refill on your cardiac medications before your next appointment, please call your pharmacy*   Lab Work: None ordered If you have labs (blood work) drawn today and your tests are completely normal, you will receive your results only by: [place] (if you have MyChart) OR A paper copy in the mail If you have any lab test that is abnormal or we need to change your treatment, we will call you to review the results.   Testing/Procedures: A zio monitor was ordered today. It will remain on for 14 days. You will then return monitor and event diary in provided box. It takes 1-2 weeks for report to be downloaded and returned to [place]. We will call you with the results. If monitor falls off or has orange flashing light, please call [employer] for further instructions.  Remove 06/30/22.   Follow-Up: At [facility], you and your health needs are our priority.  As part of our continuing mission to provide you with exceptional heart care, we have created designated Provider Care Teams.  These Care Teams include your primary Cardiologist (physician) and Advanced Practice Providers (APPs -  Physician Assistants and Nurse Practitioners) who all work together to provide you with the care you need, when you need it.  We recommend signing up for the patient portal called "MyChart".  Sign up information is provided on this After Visit Summary.  MyChart is used to connect with patients for Virtual Visits (Telemedicine).  Patients are able to view lab/test results, encounter notes, upcoming appointments, etc.  Non-urgent messages can be sent to your provider as well.   To learn more about what you can do with MyChart, go to [email or web address].    Your next appointment:   9 month(s)  The format for your next  appointment:   In Person  Provider:   Jyl Heinz, MD    Other Instructions none  Important Information About [place]

## 2022-06-16 NOTE — Progress Notes (Signed)
Cardiology Office Note:    Date:  06/16/2022   ID:  Lauren Roth, DOB June 26, 1945, MRN [id number]  PCP:  Nicholos Johns, MD  Cardiologist:  Jenean Lindau, MD   Referring MD: Nicholos Johns, MD    ASSESSMENT:    1. Essential hypertension   2. Palpitations   3. Obesity (BMI 30.0-34.9)    PLAN:    In order of problems listed above:  Primary prevention stressed with the patient.  Importance of compliance with diet medication stressed and she vocalized understanding. Essential hypertension: Blood pressure stable.  She tells me that she is still under stress today.  She mentions to me that her blood pressure readings are fine and told me the numbers. Palpitations: Will do a 2-week monitor.  Her blood work from recent evaluation is fine.  In view of her history I will do a Chem-7 and a CBC today. Obesity: Weight reduction stressed and diet was emphasized and she promises to do better.  She knows to go to the nearest emergency room for any concerning symptoms.   Medication Adjustments/Labs and Tests Ordered: Current medicines are reviewed at length with the patient today.  Concerns regarding medicines are outlined above.  No orders of the defined types were placed in this encounter.  No orders of the defined types were placed in this encounter.    No chief complaint on file.    History of Present Illness:    Lauren Roth is a 77 y.o. female.  Patient has past medical history of essential hypertension.  She mentions to me that she has not been feeling over the past couple of weeks.  She went to the emergency room and got a complete evaluation and was discharged.  I reviewed those records.  She denies any chest pain orthopnea or PND she gives history of fatigue.  Past Medical History:  Diagnosis Date   Allergic rhinitis 01/16/2009   Qualifier: Diagnosis of  By: Loanne Drilling MD, Jacelyn Pi   Formatting of this note might be different from the original. Overview:   Qualifier: Diagnosis of  By: Loanne Drilling MD, Sean A   Arthritis    Chronic pain of right knee 06/12/2021   Chronic pain syndrome 07/30/2013   DEGENERATIVE DISC DISEASE, LUMBOSACRAL SPINE 01/16/2009   Qualifier: Diagnosis of  By: Loanne Drilling MD, Sean A    Depression    DEPRESSION 01/16/2009   Qualifier: Diagnosis of  By: Loanne Drilling MD, Hilliard Clark A    DIAPHORESIS 01/16/2009   Qualifier: Diagnosis of  By: Loanne Drilling MD, Sean A    Diaphoresis 01/16/2009   Qualifier: Diagnosis of  By: Loanne Drilling MD, Jacelyn Pi  Formatting of this note might be different from the original. Overview:  Qualifier: Diagnosis of  By: Loanne Drilling MD, Sean A   Edema 01/16/2009   Qualifier: Diagnosis of  By: Loanne Drilling MD, Jacelyn Pi   Formatting of this note might be different from the original. Overview:  Qualifier: Diagnosis of  By: Loanne Drilling MD, Sean A   Essential hypertension 01/28/2017   Essential thrombocytosis ([place]) 11/25/2017   Fibromyalgia    GERD 01/16/2009   Qualifier: Diagnosis of  By: Loanne Drilling MD, Sean A    Heart failure ([place]) 05/08/2021   Hypertension    HYPERTENSION 01/16/2009   Qualifier: Diagnosis of  By: Loanne Drilling MD, Sean A    Hypokalemia 01/16/2009   Qualifier: Diagnosis of  By: Loanne Drilling MD, Jacelyn Pi   Formatting of this note might be different from the original. Overview:  Qualifier: Diagnosis of  By: Loanne Drilling MD, Sean A   Left hip pain 05/24/2019   Lumbosacral spondylosis 10/09/2010   Lymphedema 02/10/2021   Medication management 08/25/2010   Memory loss 01/16/2009   Qualifier: Diagnosis of  By: Loanne Drilling MD, Jacelyn Pi   Formatting of this note might be different from the original. Overview:  Qualifier: Diagnosis of  By: Loanne Drilling MD, Sean A   Menopausal and postmenopausal disorder 01/16/2009   Qualifier: Diagnosis of  By: Loanne Drilling MD, Jacelyn Pi   Formatting of this note might be different from the original. Overview:  Qualifier: Diagnosis of  By: Loanne Drilling MD, Sean A   Moderate aortic regurgitation 05/21/2019   Neuropathic pain 09/25/2020    Neuropathy    Obesity (BMI 30.0-34.9) 10/13/2021   Pain in soft tissues of limb 06/20/2012   Formatting of this note might be different from the original. Overview:  IMPRESSION: Left knee pain Formatting of this note might be different from the original. IMPRESSION: Left knee pain   Pain in thoracic spine 07/30/2013   Palpitations 01/28/2017   Pre-operative cardiovascular examination 11/25/2017   Primary osteoarthritis of right knee 10/06/2014   Restless legs syndrome 08/25/2010   Shortness of breath on exertion 01/28/2017   SI (sacroiliac) pain 05/10/2016   Thoracic spondylosis 11/20/2010   Overview:  Overview:  IMPRESSION: Thoracic radiculopathy Overview:  IMPRESSION: Thoracic radiculopathy  Formatting of this note might be different from the original. Overview:  IMPRESSION: Thoracic radiculopathy Formatting of this note might be different from the original. IMPRESSION: Thoracic radiculopathy   Thrombocythemia, essential ([place]) 09/12/2013   Thyroid disease    TMJ (dislocation of temporomandibular joint) 07/30/2013   Tremor 12/29/2021   Unspecified hypothyroidism 01/16/2009   Qualifier: Diagnosis of  By: Loanne Drilling MD, Sean A    Uterine cancer ([place]) 07/30/2013    Past Surgical History:  Procedure Laterality Date   APPENDECTOMY     CERVICAL DISC SURGERY     CHOLECYSTECTOMY     FRACTURE SURGERY Left    Left arm has a metal plate   KNEE SURGERY      Current Medications: Current Meds  Medication Sig   albuterol (VENTOLIN HFA) 108 (90 Base) MCG/ACT inhaler Inhale 2 puffs into the lungs every 6 (six) hours as needed for wheezing or shortness of breath.   amLODipine (NORVASC) 5 MG tablet Take 5 mg by mouth daily.   ARIPiprazole (ABILIFY) 5 MG tablet Take 5 mg by mouth daily.   aspirin 81 MG chewable tablet Chew 81 mg by mouth daily.   azithromycin (ZITHROMAX) 250 MG tablet Take 250 mg by mouth as directed.   buprenorphine (SUBUTEX) 2 MG SUBL SL tablet Place 2 mg under the tongue daily.    buPROPion (WELLBUTRIN XL) 300 MG 24 hr tablet Take 1 tablet by mouth daily.   cloNIDine (CATAPRES) 0.1 MG tablet Take 1 tablet by mouth 2 (two) times daily.   diclofenac Sodium (VOLTAREN) 1 % GEL Apply 0.5 g topically 4 (four) times daily.   DILT-XR 240 MG 24 hr capsule Take 240 mg by mouth daily.   donepezil (ARICEPT) 10 MG tablet Take 10 mg by mouth daily.   DULoxetine (CYMBALTA) 60 MG capsule Take 60 mg by mouth daily.   estrogens, conjugated, (PREMARIN) 0.625 MG tablet Take 1 tablet by mouth daily.   finasteride (PROSCAR) 5 MG tablet Take 5 mg by mouth daily.   fluticasone (FLONASE) 50 MCG/ACT nasal spray Place 1 spray into both nostrils daily as needed for allergies  or rhinitis.   furosemide (LASIX) 20 MG tablet Take 20 mg by mouth daily.   iron polysaccharides (NIFEREX) 150 MG capsule Take 150 mg by mouth daily.   levothyroxine (SYNTHROID) 50 MCG tablet Take 50 mcg by mouth daily.   loratadine (CLARITIN) 10 MG tablet Take 10 mg by mouth daily.   losartan (COZAAR) 50 MG tablet Take 50 mg by mouth daily.   montelukast (SINGULAIR) 10 MG tablet Take 10 mg by mouth at bedtime.   nitroGLYCERIN (NITROSTAT) 0.4 MG SL tablet Place 1 tablet (0.4 mg total) under the tongue as needed for chest pain.   nystatin cream (MYCOSTATIN) Apply 1 application topically 2 (two) times daily.   omeprazole (PRILOSEC) 20 MG capsule Take 1 capsule by mouth daily.   oxybutynin (DITROPAN-XL) 10 MG 24 hr tablet Take 10 mg by mouth daily.   oxyCODONE-acetaminophen (PERCOCET) 10-325 MG tablet Take 1 tablet by mouth every 4 (four) hours as needed for pain.   potassium chloride (KLOR-CON) 10 MEQ tablet Take 10 mEq by mouth 2 (two) times daily.   pregabalin (LYRICA) 75 MG capsule Take 150 mg by mouth at bedtime.   propranolol ER (INDERAL LA) 60 MG 24 hr capsule Take 60 mg by mouth daily.   rOPINIRole (REQUIP) 1 MG tablet Take 1 tablet by mouth daily.   tiZANidine (ZANAFLEX) 4 MG tablet Take 4 mg by mouth 3 (three) times  daily as needed for muscle spasms.   topiramate (TOPAMAX) 25 MG tablet Take 25 mg by mouth 2 (two) times daily.   traZODone (DESYREL) 100 MG tablet Take 100 mg by mouth at bedtime.   Vitamin D, Ergocalciferol, (DRISDOL) 1.25 MG (50000 UNIT) CAPS capsule Take 50,000 Units by mouth once a week.     Allergies:   Cottonseed oil and Codeine sulfate   Social History   Socioeconomic History   Marital status: Married    Spouse name: Not on file   Number of children: Not on file   Years of education: Not on file   Highest education level: Not on file  Occupational History   Not on file  Tobacco Use   Smoking status: Former   Smokeless tobacco: Never   Tobacco comments:    at age 51  Vaping Use   Vaping Use: Never used  Substance and Sexual Activity   Alcohol use: No   Drug use: No   Sexual activity: Not on file  Other Topics Concern   Not on file  Social History Narrative   Not on file   Social Determinants of Health   Financial Resource Strain: Not on file  Food Insecurity: Not on file  Transportation Needs: Not on file  Physical Activity: Not on file  Stress: Not on file  Social Connections: Not on file     Family History: The patient's family history includes Deep vein thrombosis in her father; Heart disease in her brother, brother, and father; Heart failure in her father; Pulmonary embolism in her brother.  ROS:   Please see the history of present illness.    All other systems reviewed and are negative.  EKGs/Labs/Other Studies Reviewed:    The following studies were reviewed today: EKG: Sinus bradycardia nonspecific ST-T changes   Recent Labs: No results found for requested labs within last 365 days.  Recent Lipid Panel No results found for: "CHOL", "TRIG", "HDL", "CHOLHDL", "VLDL", "LDLCALC", "LDLDIRECT"  Physical Exam:    VS:  BP (!) 172/70   Pulse (!) 58  Ht '5\' 7"'$  (1.702 m)   Wt 218 lb 3.2 oz (99 kg)   SpO2 98%   BMI 34.17 kg/m     Wt Readings  from Last 3 Encounters:  06/16/22 218 lb 3.2 oz (99 kg)  10/13/21 183 lb (83 kg)  05/29/21 171 lb 6.4 oz (77.7 kg)     GEN: Patient is in no acute distress HEENT: Normal NECK: No JVD; No carotid bruits LYMPHATICS: No lymphadenopathy CARDIAC: Hear sounds regular, 2/6 systolic murmur at the apex. RESPIRATORY:  Clear to auscultation without rales, wheezing or rhonchi  ABDOMEN: Soft, non-tender, non-distended MUSCULOSKELETAL:  No edema; No deformity  SKIN: Warm and dry NEUROLOGIC:  Alert and oriented x 3 PSYCHIATRIC:  Normal affect   Signed, Jenean Lindau, MD  06/16/2022 4:34 PM    [facility]

## 2022-06-16 NOTE — Telephone Encounter (Signed)
Pt called back stating she is feeling better. Will keep appt today.

## 2022-06-17 LAB — BASIC METABOLIC PANEL
BUN/Creatinine Ratio: 10 — ABNORMAL LOW (ref 12–28)
BUN: 8 mg/dL (ref 8–27)
CO2: 29 mmol/L (ref 20–29)
Calcium: 10.2 mg/dL (ref 8.7–10.3)
Chloride: 95 mmol/L — ABNORMAL LOW (ref 96–106)
Creatinine, Ser: 0.78 mg/dL (ref 0.57–1.00)
Glucose: 115 mg/dL — ABNORMAL HIGH (ref 70–99)
Potassium: 4.4 mmol/L (ref 3.5–5.2)
Sodium: 141 mmol/L (ref 134–144)
eGFR: 79 mL/min/{1.73_m2} (ref >59)

## 2022-06-17 LAB — CBC
Hematocrit: 41 % (ref 34.0–46.6)
Hemoglobin: 14 g/dL (ref 11.1–15.9)
MCH: 31.1 pg (ref 26.6–33.0)
MCHC: 34.1 g/dL (ref 31.5–35.7)
MCV: 91 fL (ref 79–97)
Platelets: 282 10*3/uL (ref 150–450)
RBC: 4.5 x10E6/uL (ref 3.77–5.28)
RDW: 12.6 % (ref 11.7–15.4)
WBC: 14.1 10*3/uL — ABNORMAL HIGH (ref 3.4–10.8)

## 2022-07-05 DIAGNOSIS — R002 Palpitations: Secondary | ICD-10-CM | POA: Diagnosis not present

## 2022-07-07 DIAGNOSIS — M961 Postlaminectomy syndrome, not elsewhere classified: Secondary | ICD-10-CM | POA: Diagnosis not present

## 2022-07-07 DIAGNOSIS — F32A Depression, unspecified: Secondary | ICD-10-CM | POA: Diagnosis not present

## 2022-07-07 DIAGNOSIS — M5414 Radiculopathy, thoracic region: Secondary | ICD-10-CM | POA: Diagnosis not present

## 2022-07-07 DIAGNOSIS — M533 Sacrococcygeal disorders, not elsewhere classified: Secondary | ICD-10-CM | POA: Diagnosis not present

## 2022-07-07 DIAGNOSIS — M5137 Other intervertebral disc degeneration, lumbosacral region: Secondary | ICD-10-CM | POA: Diagnosis not present

## 2022-07-07 DIAGNOSIS — G894 Chronic pain syndrome: Secondary | ICD-10-CM | POA: Diagnosis not present

## 2022-07-08 ENCOUNTER — Telehealth: Payer: Self-pay

## 2022-07-08 NOTE — Telephone Encounter (Signed)
Results given to the patient through my chart and faxed to PCP.

## 2022-07-08 NOTE — Telephone Encounter (Signed)
-----   Message from Jenean Lindau, MD sent at 07/08/2022  1:56 PM EDT ----- The results of the study is unremarkable. Please inform patient. I will discuss in detail at next appointment. Cc  primary care/referring physician Jenean Lindau, MD 07/08/2022 1:56 PM

## 2022-07-27 DIAGNOSIS — G250 Essential tremor: Secondary | ICD-10-CM | POA: Diagnosis not present

## 2022-07-27 DIAGNOSIS — E785 Hyperlipidemia, unspecified: Secondary | ICD-10-CM | POA: Diagnosis not present

## 2022-07-27 DIAGNOSIS — Z6829 Body mass index (BMI) 29.0-29.9, adult: Secondary | ICD-10-CM | POA: Diagnosis not present

## 2022-07-27 DIAGNOSIS — I10 Essential (primary) hypertension: Secondary | ICD-10-CM | POA: Diagnosis not present

## 2022-07-27 DIAGNOSIS — M5440 Lumbago with sciatica, unspecified side: Secondary | ICD-10-CM | POA: Diagnosis not present

## 2022-07-28 DIAGNOSIS — Z79899 Other long term (current) drug therapy: Secondary | ICD-10-CM | POA: Diagnosis not present

## 2022-07-28 DIAGNOSIS — E039 Hypothyroidism, unspecified: Secondary | ICD-10-CM | POA: Diagnosis not present

## 2022-08-04 DIAGNOSIS — Z6836 Body mass index (BMI) 36.0-36.9, adult: Secondary | ICD-10-CM | POA: Diagnosis not present

## 2022-08-04 DIAGNOSIS — R413 Other amnesia: Secondary | ICD-10-CM | POA: Diagnosis not present

## 2022-08-04 DIAGNOSIS — M7989 Other specified soft tissue disorders: Secondary | ICD-10-CM | POA: Diagnosis not present

## 2022-08-04 DIAGNOSIS — I10 Essential (primary) hypertension: Secondary | ICD-10-CM | POA: Diagnosis not present

## 2022-08-04 DIAGNOSIS — Z8679 Personal history of other diseases of the circulatory system: Secondary | ICD-10-CM | POA: Diagnosis not present

## 2022-08-23 DIAGNOSIS — Z6829 Body mass index (BMI) 29.0-29.9, adult: Secondary | ICD-10-CM | POA: Diagnosis not present

## 2022-08-23 DIAGNOSIS — E663 Overweight: Secondary | ICD-10-CM | POA: Diagnosis not present

## 2022-08-23 DIAGNOSIS — M2550 Pain in unspecified joint: Secondary | ICD-10-CM | POA: Diagnosis not present

## 2022-08-24 DIAGNOSIS — I739 Peripheral vascular disease, unspecified: Secondary | ICD-10-CM | POA: Diagnosis not present

## 2022-08-25 DIAGNOSIS — I471 Supraventricular tachycardia, unspecified: Secondary | ICD-10-CM | POA: Diagnosis not present

## 2022-08-25 DIAGNOSIS — I110 Hypertensive heart disease with heart failure: Secondary | ICD-10-CM | POA: Diagnosis not present

## 2022-08-25 DIAGNOSIS — R0602 Shortness of breath: Secondary | ICD-10-CM | POA: Diagnosis not present

## 2022-08-25 DIAGNOSIS — I5032 Chronic diastolic (congestive) heart failure: Secondary | ICD-10-CM | POA: Diagnosis not present

## 2022-08-25 DIAGNOSIS — I1A0 Resistant hypertension: Secondary | ICD-10-CM | POA: Diagnosis not present

## 2022-08-25 DIAGNOSIS — I351 Nonrheumatic aortic (valve) insufficiency: Secondary | ICD-10-CM | POA: Diagnosis not present

## 2022-08-25 DIAGNOSIS — I890 Lymphedema, not elsewhere classified: Secondary | ICD-10-CM | POA: Diagnosis not present

## 2022-08-25 DIAGNOSIS — I499 Cardiac arrhythmia, unspecified: Secondary | ICD-10-CM | POA: Diagnosis not present

## 2022-08-26 DIAGNOSIS — I499 Cardiac arrhythmia, unspecified: Secondary | ICD-10-CM | POA: Diagnosis not present

## 2022-08-26 DIAGNOSIS — R0602 Shortness of breath: Secondary | ICD-10-CM | POA: Diagnosis not present

## 2022-09-07 ENCOUNTER — Telehealth: Payer: Self-pay | Admitting: Cardiology

## 2022-09-07 NOTE — Telephone Encounter (Signed)
Patient called to cancel her appt for 6/26 and to say she is changing practices.

## 2022-09-17 DIAGNOSIS — R600 Localized edema: Secondary | ICD-10-CM | POA: Diagnosis not present

## 2022-09-17 DIAGNOSIS — G8929 Other chronic pain: Secondary | ICD-10-CM | POA: Diagnosis not present

## 2022-09-23 DIAGNOSIS — M19011 Primary osteoarthritis, right shoulder: Secondary | ICD-10-CM | POA: Diagnosis not present

## 2022-09-23 DIAGNOSIS — Z6829 Body mass index (BMI) 29.0-29.9, adult: Secondary | ICD-10-CM | POA: Diagnosis not present

## 2022-09-24 DIAGNOSIS — M19011 Primary osteoarthritis, right shoulder: Secondary | ICD-10-CM | POA: Diagnosis not present

## 2022-09-29 DIAGNOSIS — G894 Chronic pain syndrome: Secondary | ICD-10-CM | POA: Diagnosis not present

## 2022-09-29 DIAGNOSIS — M961 Postlaminectomy syndrome, not elsewhere classified: Secondary | ICD-10-CM | POA: Diagnosis not present

## 2022-09-29 DIAGNOSIS — M533 Sacrococcygeal disorders, not elsewhere classified: Secondary | ICD-10-CM | POA: Diagnosis not present

## 2022-09-29 DIAGNOSIS — Z79899 Other long term (current) drug therapy: Secondary | ICD-10-CM | POA: Diagnosis not present

## 2022-09-29 DIAGNOSIS — M792 Neuralgia and neuritis, unspecified: Secondary | ICD-10-CM | POA: Diagnosis not present

## 2022-09-30 DIAGNOSIS — M7989 Other specified soft tissue disorders: Secondary | ICD-10-CM | POA: Diagnosis not present

## 2022-09-30 DIAGNOSIS — G894 Chronic pain syndrome: Secondary | ICD-10-CM | POA: Diagnosis not present

## 2022-09-30 DIAGNOSIS — Z6829 Body mass index (BMI) 29.0-29.9, adult: Secondary | ICD-10-CM | POA: Diagnosis not present

## 2022-10-01 DIAGNOSIS — M25561 Pain in right knee: Secondary | ICD-10-CM | POA: Diagnosis not present

## 2022-10-01 DIAGNOSIS — G894 Chronic pain syndrome: Secondary | ICD-10-CM | POA: Diagnosis not present

## 2022-10-01 DIAGNOSIS — G8929 Other chronic pain: Secondary | ICD-10-CM | POA: Diagnosis not present

## 2022-10-01 DIAGNOSIS — I890 Lymphedema, not elsewhere classified: Secondary | ICD-10-CM | POA: Diagnosis not present

## 2022-10-07 DIAGNOSIS — Z6829 Body mass index (BMI) 29.0-29.9, adult: Secondary | ICD-10-CM | POA: Diagnosis not present

## 2022-10-07 DIAGNOSIS — E559 Vitamin D deficiency, unspecified: Secondary | ICD-10-CM | POA: Diagnosis not present

## 2022-10-07 DIAGNOSIS — N959 Unspecified menopausal and perimenopausal disorder: Secondary | ICD-10-CM | POA: Diagnosis not present

## 2022-10-07 DIAGNOSIS — Z1231 Encounter for screening mammogram for malignant neoplasm of breast: Secondary | ICD-10-CM | POA: Diagnosis not present

## 2022-10-07 DIAGNOSIS — M19011 Primary osteoarthritis, right shoulder: Secondary | ICD-10-CM | POA: Diagnosis not present

## 2022-10-07 DIAGNOSIS — M7989 Other specified soft tissue disorders: Secondary | ICD-10-CM | POA: Diagnosis not present

## 2022-10-13 ENCOUNTER — Ambulatory Visit: Payer: PPO | Admitting: Cardiology

## 2022-10-13 DIAGNOSIS — I361 Nonrheumatic tricuspid (valve) insufficiency: Secondary | ICD-10-CM | POA: Diagnosis not present

## 2022-10-27 DIAGNOSIS — E261 Secondary hyperaldosteronism: Secondary | ICD-10-CM | POA: Diagnosis not present

## 2022-10-27 DIAGNOSIS — G63 Polyneuropathy in diseases classified elsewhere: Secondary | ICD-10-CM | POA: Diagnosis not present

## 2022-10-27 DIAGNOSIS — I509 Heart failure, unspecified: Secondary | ICD-10-CM | POA: Diagnosis not present

## 2022-10-27 DIAGNOSIS — I4891 Unspecified atrial fibrillation: Secondary | ICD-10-CM | POA: Diagnosis not present

## 2022-10-27 DIAGNOSIS — I110 Hypertensive heart disease with heart failure: Secondary | ICD-10-CM | POA: Diagnosis not present

## 2022-10-27 DIAGNOSIS — D6869 Other thrombophilia: Secondary | ICD-10-CM | POA: Diagnosis not present

## 2022-10-27 DIAGNOSIS — E559 Vitamin D deficiency, unspecified: Secondary | ICD-10-CM | POA: Diagnosis not present

## 2022-10-27 DIAGNOSIS — F3342 Major depressive disorder, recurrent, in full remission: Secondary | ICD-10-CM | POA: Diagnosis not present

## 2022-10-27 DIAGNOSIS — F1120 Opioid dependence, uncomplicated: Secondary | ICD-10-CM | POA: Diagnosis not present

## 2022-10-27 DIAGNOSIS — E039 Hypothyroidism, unspecified: Secondary | ICD-10-CM | POA: Diagnosis not present

## 2022-10-27 DIAGNOSIS — D509 Iron deficiency anemia, unspecified: Secondary | ICD-10-CM | POA: Diagnosis not present

## 2022-10-27 DIAGNOSIS — E038 Other specified hypothyroidism: Secondary | ICD-10-CM | POA: Diagnosis not present

## 2022-10-28 DIAGNOSIS — M8589 Other specified disorders of bone density and structure, multiple sites: Secondary | ICD-10-CM | POA: Diagnosis not present

## 2022-10-28 DIAGNOSIS — N959 Unspecified menopausal and perimenopausal disorder: Secondary | ICD-10-CM | POA: Diagnosis not present

## 2022-10-28 DIAGNOSIS — Z1231 Encounter for screening mammogram for malignant neoplasm of breast: Secondary | ICD-10-CM | POA: Diagnosis not present

## 2022-11-04 DIAGNOSIS — M159 Polyosteoarthritis, unspecified: Secondary | ICD-10-CM | POA: Diagnosis not present

## 2022-11-04 DIAGNOSIS — M47816 Spondylosis without myelopathy or radiculopathy, lumbar region: Secondary | ICD-10-CM | POA: Diagnosis not present

## 2022-11-04 DIAGNOSIS — Z6834 Body mass index (BMI) 34.0-34.9, adult: Secondary | ICD-10-CM | POA: Diagnosis not present

## 2022-11-04 DIAGNOSIS — M810 Age-related osteoporosis without current pathological fracture: Secondary | ICD-10-CM | POA: Diagnosis not present

## 2022-11-09 DIAGNOSIS — E041 Nontoxic single thyroid nodule: Secondary | ICD-10-CM | POA: Diagnosis not present

## 2022-11-09 DIAGNOSIS — Z86718 Personal history of other venous thrombosis and embolism: Secondary | ICD-10-CM | POA: Diagnosis not present

## 2022-11-09 DIAGNOSIS — E876 Hypokalemia: Secondary | ICD-10-CM | POA: Diagnosis not present

## 2022-11-09 DIAGNOSIS — E039 Hypothyroidism, unspecified: Secondary | ICD-10-CM | POA: Diagnosis not present

## 2022-11-09 DIAGNOSIS — G2581 Restless legs syndrome: Secondary | ICD-10-CM | POA: Diagnosis not present

## 2022-11-09 DIAGNOSIS — Z7982 Long term (current) use of aspirin: Secondary | ICD-10-CM | POA: Diagnosis not present

## 2022-11-09 DIAGNOSIS — I6521 Occlusion and stenosis of right carotid artery: Secondary | ICD-10-CM | POA: Diagnosis not present

## 2022-11-09 DIAGNOSIS — I160 Hypertensive urgency: Secondary | ICD-10-CM | POA: Diagnosis not present

## 2022-11-09 DIAGNOSIS — I509 Heart failure, unspecified: Secondary | ICD-10-CM | POA: Diagnosis not present

## 2022-11-09 DIAGNOSIS — F0390 Unspecified dementia without behavioral disturbance: Secondary | ICD-10-CM | POA: Diagnosis not present

## 2022-11-09 DIAGNOSIS — I10 Essential (primary) hypertension: Secondary | ICD-10-CM | POA: Diagnosis not present

## 2022-11-09 DIAGNOSIS — Z79899 Other long term (current) drug therapy: Secondary | ICD-10-CM | POA: Diagnosis not present

## 2022-11-09 DIAGNOSIS — M79605 Pain in left leg: Secondary | ICD-10-CM | POA: Diagnosis not present

## 2022-11-09 DIAGNOSIS — R519 Headache, unspecified: Secondary | ICD-10-CM | POA: Diagnosis not present

## 2022-11-09 DIAGNOSIS — N3281 Overactive bladder: Secondary | ICD-10-CM | POA: Diagnosis not present

## 2022-11-09 DIAGNOSIS — I739 Peripheral vascular disease, unspecified: Secondary | ICD-10-CM | POA: Diagnosis not present

## 2022-11-09 DIAGNOSIS — R001 Bradycardia, unspecified: Secondary | ICD-10-CM | POA: Diagnosis not present

## 2022-11-09 DIAGNOSIS — I672 Cerebral atherosclerosis: Secondary | ICD-10-CM | POA: Diagnosis not present

## 2022-11-09 DIAGNOSIS — K219 Gastro-esophageal reflux disease without esophagitis: Secondary | ICD-10-CM | POA: Diagnosis not present

## 2022-11-10 DIAGNOSIS — I160 Hypertensive urgency: Secondary | ICD-10-CM | POA: Diagnosis not present

## 2022-11-10 DIAGNOSIS — F0390 Unspecified dementia without behavioral disturbance: Secondary | ICD-10-CM | POA: Diagnosis not present

## 2022-11-10 DIAGNOSIS — N3281 Overactive bladder: Secondary | ICD-10-CM | POA: Diagnosis not present

## 2022-11-15 DIAGNOSIS — Z09 Encounter for follow-up examination after completed treatment for conditions other than malignant neoplasm: Secondary | ICD-10-CM | POA: Diagnosis not present

## 2022-11-15 DIAGNOSIS — I10 Essential (primary) hypertension: Secondary | ICD-10-CM | POA: Diagnosis not present

## 2022-11-15 DIAGNOSIS — M542 Cervicalgia: Secondary | ICD-10-CM | POA: Diagnosis not present

## 2022-11-18 DIAGNOSIS — M810 Age-related osteoporosis without current pathological fracture: Secondary | ICD-10-CM | POA: Diagnosis not present

## 2022-11-18 DIAGNOSIS — Z6836 Body mass index (BMI) 36.0-36.9, adult: Secondary | ICD-10-CM | POA: Diagnosis not present

## 2022-11-18 DIAGNOSIS — M19012 Primary osteoarthritis, left shoulder: Secondary | ICD-10-CM | POA: Diagnosis not present

## 2022-11-18 DIAGNOSIS — J309 Allergic rhinitis, unspecified: Secondary | ICD-10-CM | POA: Diagnosis not present

## 2022-11-18 DIAGNOSIS — E039 Hypothyroidism, unspecified: Secondary | ICD-10-CM | POA: Diagnosis not present

## 2022-11-18 DIAGNOSIS — M170 Bilateral primary osteoarthritis of knee: Secondary | ICD-10-CM | POA: Diagnosis not present

## 2022-11-18 DIAGNOSIS — Z8679 Personal history of other diseases of the circulatory system: Secondary | ICD-10-CM | POA: Diagnosis not present

## 2022-11-18 DIAGNOSIS — E785 Hyperlipidemia, unspecified: Secondary | ICD-10-CM | POA: Diagnosis not present

## 2022-11-18 DIAGNOSIS — M25512 Pain in left shoulder: Secondary | ICD-10-CM | POA: Diagnosis not present

## 2022-11-18 DIAGNOSIS — Z79899 Other long term (current) drug therapy: Secondary | ICD-10-CM | POA: Diagnosis not present

## 2022-11-18 DIAGNOSIS — I10 Essential (primary) hypertension: Secondary | ICD-10-CM | POA: Diagnosis not present

## 2022-11-18 DIAGNOSIS — G8929 Other chronic pain: Secondary | ICD-10-CM | POA: Diagnosis not present

## 2022-11-18 DIAGNOSIS — E559 Vitamin D deficiency, unspecified: Secondary | ICD-10-CM | POA: Diagnosis not present

## 2022-11-18 DIAGNOSIS — M7989 Other specified soft tissue disorders: Secondary | ICD-10-CM | POA: Diagnosis not present

## 2022-11-27 ENCOUNTER — Other Ambulatory Visit (HOSPITAL_BASED_OUTPATIENT_CLINIC_OR_DEPARTMENT_OTHER): Payer: Self-pay

## 2022-11-27 DIAGNOSIS — R0683 Snoring: Secondary | ICD-10-CM

## 2022-12-03 DIAGNOSIS — I471 Supraventricular tachycardia, unspecified: Secondary | ICD-10-CM | POA: Diagnosis not present

## 2022-12-03 DIAGNOSIS — G43909 Migraine, unspecified, not intractable, without status migrainosus: Secondary | ICD-10-CM | POA: Diagnosis not present

## 2022-12-03 DIAGNOSIS — Z7982 Long term (current) use of aspirin: Secondary | ICD-10-CM | POA: Diagnosis not present

## 2022-12-03 DIAGNOSIS — I5032 Chronic diastolic (congestive) heart failure: Secondary | ICD-10-CM | POA: Diagnosis not present

## 2022-12-03 DIAGNOSIS — E669 Obesity, unspecified: Secondary | ICD-10-CM | POA: Diagnosis not present

## 2022-12-03 DIAGNOSIS — I10 Essential (primary) hypertension: Secondary | ICD-10-CM | POA: Diagnosis not present

## 2022-12-03 DIAGNOSIS — Z79899 Other long term (current) drug therapy: Secondary | ICD-10-CM | POA: Diagnosis not present

## 2022-12-03 DIAGNOSIS — Z6834 Body mass index (BMI) 34.0-34.9, adult: Secondary | ICD-10-CM | POA: Diagnosis not present

## 2022-12-03 DIAGNOSIS — I110 Hypertensive heart disease with heart failure: Secondary | ICD-10-CM | POA: Diagnosis not present

## 2022-12-03 DIAGNOSIS — Z556 Problems related to health literacy: Secondary | ICD-10-CM | POA: Diagnosis not present

## 2022-12-06 DIAGNOSIS — I10 Essential (primary) hypertension: Secondary | ICD-10-CM | POA: Diagnosis not present

## 2022-12-08 DIAGNOSIS — F331 Major depressive disorder, recurrent, moderate: Secondary | ICD-10-CM | POA: Diagnosis not present

## 2022-12-08 DIAGNOSIS — F411 Generalized anxiety disorder: Secondary | ICD-10-CM | POA: Insufficient documentation

## 2022-12-13 DIAGNOSIS — M47812 Spondylosis without myelopathy or radiculopathy, cervical region: Secondary | ICD-10-CM | POA: Diagnosis not present

## 2022-12-14 DIAGNOSIS — G629 Polyneuropathy, unspecified: Secondary | ICD-10-CM | POA: Diagnosis not present

## 2022-12-14 DIAGNOSIS — F0393 Unspecified dementia, unspecified severity, with mood disturbance: Secondary | ICD-10-CM | POA: Diagnosis not present

## 2022-12-14 DIAGNOSIS — E039 Hypothyroidism, unspecified: Secondary | ICD-10-CM | POA: Diagnosis not present

## 2022-12-14 DIAGNOSIS — K219 Gastro-esophageal reflux disease without esophagitis: Secondary | ICD-10-CM | POA: Diagnosis not present

## 2022-12-14 DIAGNOSIS — M797 Fibromyalgia: Secondary | ICD-10-CM | POA: Diagnosis not present

## 2022-12-14 DIAGNOSIS — F3342 Major depressive disorder, recurrent, in full remission: Secondary | ICD-10-CM | POA: Diagnosis not present

## 2022-12-14 DIAGNOSIS — Z6836 Body mass index (BMI) 36.0-36.9, adult: Secondary | ICD-10-CM | POA: Diagnosis not present

## 2022-12-14 DIAGNOSIS — I110 Hypertensive heart disease with heart failure: Secondary | ICD-10-CM | POA: Diagnosis not present

## 2022-12-14 DIAGNOSIS — E261 Secondary hyperaldosteronism: Secondary | ICD-10-CM | POA: Diagnosis not present

## 2022-12-14 DIAGNOSIS — G4700 Insomnia, unspecified: Secondary | ICD-10-CM | POA: Diagnosis not present

## 2022-12-14 DIAGNOSIS — I890 Lymphedema, not elsewhere classified: Secondary | ICD-10-CM | POA: Diagnosis not present

## 2022-12-14 DIAGNOSIS — I509 Heart failure, unspecified: Secondary | ICD-10-CM | POA: Diagnosis not present

## 2022-12-17 DIAGNOSIS — M542 Cervicalgia: Secondary | ICD-10-CM | POA: Diagnosis not present

## 2022-12-27 DIAGNOSIS — R293 Abnormal posture: Secondary | ICD-10-CM | POA: Diagnosis not present

## 2022-12-27 DIAGNOSIS — M5440 Lumbago with sciatica, unspecified side: Secondary | ICD-10-CM | POA: Diagnosis not present

## 2022-12-27 DIAGNOSIS — M47816 Spondylosis without myelopathy or radiculopathy, lumbar region: Secondary | ICD-10-CM | POA: Diagnosis not present

## 2022-12-27 DIAGNOSIS — R2681 Unsteadiness on feet: Secondary | ICD-10-CM | POA: Diagnosis not present

## 2022-12-27 DIAGNOSIS — M2569 Stiffness of other specified joint, not elsewhere classified: Secondary | ICD-10-CM | POA: Diagnosis not present

## 2022-12-30 DIAGNOSIS — M533 Sacrococcygeal disorders, not elsewhere classified: Secondary | ICD-10-CM | POA: Diagnosis not present

## 2022-12-30 DIAGNOSIS — M47812 Spondylosis without myelopathy or radiculopathy, cervical region: Secondary | ICD-10-CM | POA: Diagnosis not present

## 2022-12-30 DIAGNOSIS — M47817 Spondylosis without myelopathy or radiculopathy, lumbosacral region: Secondary | ICD-10-CM | POA: Diagnosis not present

## 2022-12-30 DIAGNOSIS — G894 Chronic pain syndrome: Secondary | ICD-10-CM | POA: Diagnosis not present

## 2022-12-31 DIAGNOSIS — Z6832 Body mass index (BMI) 32.0-32.9, adult: Secondary | ICD-10-CM | POA: Diagnosis not present

## 2022-12-31 DIAGNOSIS — G8929 Other chronic pain: Secondary | ICD-10-CM | POA: Diagnosis not present

## 2023-01-05 DIAGNOSIS — F331 Major depressive disorder, recurrent, moderate: Secondary | ICD-10-CM | POA: Diagnosis not present

## 2023-01-05 DIAGNOSIS — F411 Generalized anxiety disorder: Secondary | ICD-10-CM | POA: Diagnosis not present

## 2023-01-07 DIAGNOSIS — M25561 Pain in right knee: Secondary | ICD-10-CM | POA: Diagnosis not present

## 2023-01-07 DIAGNOSIS — G8929 Other chronic pain: Secondary | ICD-10-CM | POA: Diagnosis not present

## 2023-01-07 DIAGNOSIS — I890 Lymphedema, not elsewhere classified: Secondary | ICD-10-CM | POA: Diagnosis not present

## 2023-01-11 DIAGNOSIS — L84 Corns and callosities: Secondary | ICD-10-CM | POA: Diagnosis not present

## 2023-01-11 DIAGNOSIS — M79674 Pain in right toe(s): Secondary | ICD-10-CM | POA: Insufficient documentation

## 2023-01-11 DIAGNOSIS — M79675 Pain in left toe(s): Secondary | ICD-10-CM | POA: Diagnosis not present

## 2023-01-11 DIAGNOSIS — M79672 Pain in left foot: Secondary | ICD-10-CM | POA: Diagnosis not present

## 2023-01-11 DIAGNOSIS — M79671 Pain in right foot: Secondary | ICD-10-CM | POA: Diagnosis not present

## 2023-01-11 DIAGNOSIS — M216X1 Other acquired deformities of right foot: Secondary | ICD-10-CM | POA: Diagnosis not present

## 2023-01-11 DIAGNOSIS — M216X2 Other acquired deformities of left foot: Secondary | ICD-10-CM | POA: Diagnosis not present

## 2023-01-12 DIAGNOSIS — I110 Hypertensive heart disease with heart failure: Secondary | ICD-10-CM | POA: Diagnosis not present

## 2023-01-12 DIAGNOSIS — I509 Heart failure, unspecified: Secondary | ICD-10-CM | POA: Diagnosis not present

## 2023-01-12 DIAGNOSIS — E261 Secondary hyperaldosteronism: Secondary | ICD-10-CM | POA: Diagnosis not present

## 2023-01-18 ENCOUNTER — Ambulatory Visit (HOSPITAL_BASED_OUTPATIENT_CLINIC_OR_DEPARTMENT_OTHER): Payer: Medicare HMO | Attending: Internal Medicine | Admitting: Internal Medicine

## 2023-01-18 DIAGNOSIS — G4733 Obstructive sleep apnea (adult) (pediatric): Secondary | ICD-10-CM | POA: Diagnosis not present

## 2023-01-18 DIAGNOSIS — R0683 Snoring: Secondary | ICD-10-CM

## 2023-01-20 DIAGNOSIS — L84 Corns and callosities: Secondary | ICD-10-CM | POA: Diagnosis not present

## 2023-01-20 DIAGNOSIS — M778 Other enthesopathies, not elsewhere classified: Secondary | ICD-10-CM | POA: Diagnosis not present

## 2023-01-22 DIAGNOSIS — R0683 Snoring: Secondary | ICD-10-CM

## 2023-01-22 NOTE — Procedures (Signed)
    Patient Name: Lauren Roth, Lauren Roth Date: 01/18/2023 Gender: Female D.O.B: 17-Feb-1946 Age (years): 85 Referring Provider: Lucianne Lei MD Height (inches): 64 Interpreting Physician: Jetty Duhamel MD, ABSM Weight (lbs): 209 RPSGT: Cherylann Parr BMI: 36 MRN: [id number] Neck Size: 15.00  CLINICAL INFORMATION Sleep Study Type: NPSG Indication for sleep study: Fatigue, Obesity, Snoring, Witnesses Apnea / Gasping During Sleep Epworth Sleepiness Score: 12  SLEEP STUDY TECHNIQUE As per the AASM Manual for the Scoring of Sleep and Associated Events v2.3 (April 2016) with a hypopnea requiring 4% desaturations.  The channels recorded and monitored were frontal, central and occipital EEG, electrooculogram (EOG), submentalis EMG (chin), nasal and oral airflow, thoracic and abdominal wall motion, anterior tibialis EMG, snore microphone, electrocardiogram, and pulse oximetry.  MEDICATIONS Medications self-administered by patient taken the night of the study : none reported  SLEEP ARCHITECTURE The study was initiated at 9:55:06 PM and ended at 4:28:50 AM.  Sleep onset time was 0.4 minutes and the sleep efficiency was 89.0%. The total sleep time was 350.5 minutes.  Stage REM latency was 197.0 minutes.  The patient spent 1.4% of the night in stage N1 sleep, 74.9% in stage N2 sleep, 4.6% in stage N3 and 19.1% in REM.  Alpha intrusion was absent.  Supine sleep was 100.00%.  RESPIRATORY PARAMETERS The overall apnea/hypopnea index (AHI) was 13.5 per hour. There were 9 total apneas, including 9 obstructive, 0 central and 0 mixed apneas. There were 70 hypopneas and 0 RERAs.  The AHI during Stage REM sleep was 9.0 per hour.  AHI while supine was 13.5 per hour.  The mean oxygen saturation was 89.5%. The minimum SpO2 during sleep was 80.0%.  soft snoring was noted during this study.  CARDIAC DATA The 2 lead EKG demonstrated sinus rhythm. The mean heart rate was 56.7 beats per minute. Other  EKG findings include: None.  LEG MOVEMENT DATA The total PLMS were 0 with a resulting PLMS index of 0.0. Associated arousal with leg movement index was 0.0 .  IMPRESSIONS - Mild obstructive sleep apnea occurred during this study (AHI = 13.5/h). - Moderate oxygen desaturation was noted during this study (Min O2 = 80.0%, Mean 89.5%). Timw with O2 saturation 88% or less was 97.8 minutes. - The patient snored with soft snoring volume. - No cardiac abnormalities were noted during this study. - Clinically significant periodic limb movements did not occur during sleep. No significant associated arousals. - DIAGNOSIS - Obstructive Sleep Apnea (G47.33) - Nocturnal Hypoxemia (G47.36)  RECOMMENDATIONS - Suggest CPAP titration sleep study or autopap. Other options would be based on clinical judgment. - Positional therapy avoiding supine position during sleep. - Be careful with alcohol, sedatives and other CNS depressants that may worsen sleep apnea and disrupt normal sleep architecture. - Sleep hygiene should be reviewed to assess factors that may improve sleep quality. - Weight management and regular exercise should be initiated or continued if appropriate.  [Electronically signed] 01/22/2023 01:28 PM  Jetty Duhamel MD, ABSM Diplomate, American Board of Sleep Medicine NPI: [id number]                         Jetty Duhamel Diplomate, American Board of Sleep Medicine  ELECTRONICALLY SIGNED ON:  01/22/2023, 1:25 PM [facility] PH: [phone]   FX: [phone] ACCREDITED BY THE AMERICAN ACADEMY OF SLEEP MEDICINE

## 2023-01-25 ENCOUNTER — Ambulatory Visit: Payer: PPO | Admitting: Family Medicine

## 2023-01-25 ENCOUNTER — Telehealth: Payer: Self-pay

## 2023-01-25 NOTE — Telephone Encounter (Signed)
PT CALLED TO CANCEL HER APPT DUE TO HER PIPES BURSTING UNDER HER HOUSE. SHE HAS REQUESTED TO RESCHEDULE APPT. DR. COX WHERE WOULD YOU WANT TO WORK THIS NEW PATIENT INTO YOUR SCHEDULE?

## 2023-01-25 NOTE — Telephone Encounter (Signed)
Next available or with one of the other Providers.

## 2023-01-26 DIAGNOSIS — M542 Cervicalgia: Secondary | ICD-10-CM | POA: Diagnosis not present

## 2023-01-26 DIAGNOSIS — M545 Low back pain, unspecified: Secondary | ICD-10-CM | POA: Diagnosis not present

## 2023-01-26 NOTE — Telephone Encounter (Signed)
Scheduled next week. Dr. Sedalia Muta

## 2023-02-02 DIAGNOSIS — F411 Generalized anxiety disorder: Secondary | ICD-10-CM | POA: Diagnosis not present

## 2023-02-02 DIAGNOSIS — F331 Major depressive disorder, recurrent, moderate: Secondary | ICD-10-CM | POA: Diagnosis not present

## 2023-02-03 ENCOUNTER — Telehealth: Payer: Self-pay | Admitting: Family Medicine

## 2023-02-03 ENCOUNTER — Ambulatory Visit: Payer: Medicare HMO | Admitting: Family Medicine

## 2023-02-03 ENCOUNTER — Encounter: Payer: Self-pay | Admitting: Family Medicine

## 2023-02-03 VITALS — BP 138/60 | HR 48 | Temp 97.2°F | Ht 64.0 in | Wt 210.0 lb

## 2023-02-03 DIAGNOSIS — R0789 Other chest pain: Secondary | ICD-10-CM

## 2023-02-03 DIAGNOSIS — E039 Hypothyroidism, unspecified: Secondary | ICD-10-CM

## 2023-02-03 DIAGNOSIS — I5032 Chronic diastolic (congestive) heart failure: Secondary | ICD-10-CM | POA: Diagnosis not present

## 2023-02-03 DIAGNOSIS — I10 Essential (primary) hypertension: Secondary | ICD-10-CM | POA: Diagnosis not present

## 2023-02-03 DIAGNOSIS — R7303 Prediabetes: Secondary | ICD-10-CM

## 2023-02-03 DIAGNOSIS — Z23 Encounter for immunization: Secondary | ICD-10-CM

## 2023-02-03 DIAGNOSIS — D473 Essential (hemorrhagic) thrombocythemia: Secondary | ICD-10-CM | POA: Diagnosis not present

## 2023-02-03 DIAGNOSIS — E782 Mixed hyperlipidemia: Secondary | ICD-10-CM

## 2023-02-03 MED ORDER — NITROGLYCERIN 0.4 MG SL SUBL: 0.4000 mg | SUBLINGUAL_TABLET | SUBLINGUAL | 3 refills | Status: AC | PRN

## 2023-02-03 NOTE — Telephone Encounter (Signed)
Needs 40 minutes

## 2023-02-03 NOTE — Progress Notes (Signed)
Subjective:  Patient ID: Lauren Roth, female    DOB: [birth year]  Age: 77 y.o. MRN: [id number]  Chief Complaint  Patient presents with   Medical Management of Chronic Issues    Patient is establishing as a new patient.  HPI Prediabetes: Patient is trying to eat healthy.  Hyperlipidemia: Current medications: Not taking medication.  Hypertension: Complications: hyperlipidemia, prediabetes Current medications: Taking amlodipine 5 mg daily, aspirin 81 mg daily, clonidine 0.1 mg twice a day, diltiazem 240 mg daily, furosemide 20 mg daily, lisinopril 10 mg daily, losartan 50 mg daily, spironolactone 25 mg daily.  CONGESTIVE HEART FAILURE: Sees Dr. Beverely Pace. Sees him quarterly.   Lymphedema: Sees [facility]  Right knee pain: Patient saw orthopedics and the only choice is TKR, but her lymphedema is too much to do surgery.   Had chest pain last week. Her bp was 100/40. No radiation. No associated with nausea, vomiting or diaphoresis.   Patient sees pain clinic. Marzetta Board, NP. On oxycodone.  Patient sees psychiatry.  Barbette Merino, MD. On wellbutrin xl.   Fibromyalgia: on cymbalta, lyrica, tizanidine.   Headaches: been to the ED 3 times in the last month. She was admitted to [facility] for what sounds like hypertensive crisis.    Had recent sleep study read by Dr. Maple Hudson. Needs orders for apap.     02/03/2023    1:33 PM  Depression screen PHQ 2/9  Decreased Interest 0  Down, Depressed, Hopeless 0  PHQ - 2 Score 0         03/06/2018    4:47 PM 05/23/2020    4:22 PM 11/27/2020    4:16 PM 05/29/2021    2:00 PM 02/03/2023    1:32 PM  Fall Risk  Falls in the past year? 1    1  Number of falls in past year - Comments Emmi Telephone Survey Actual Response = 2      Was there an injury with Fall? 0    0  Fall Risk Category Calculator 2    2  Fall Risk Category (Retired) Moderate      (RETIRED) Patient Fall Risk Level  High fall risk Low fall risk High fall risk    Patient at Risk for Falls Due to     History of fall(s);No Fall Risks  Fall risk Follow up     Falls evaluation completed     Current Outpatient Medications on File Prior to Visit  Medication Sig Dispense Refill   albuterol (VENTOLIN HFA) 108 (90 Base) MCG/ACT inhaler Inhale 2 puffs into the lungs every 6 (six) hours as needed for wheezing or shortness of breath.     amLODipine (NORVASC) 5 MG tablet Take 5 mg by mouth daily.     aspirin 81 MG chewable tablet Chew 81 mg by mouth daily.     Biotin 10 MG TABS Take by mouth.     buPROPion (WELLBUTRIN XL) 300 MG 24 hr tablet Take 1 tablet by mouth daily.     Calcium Carb-Cholecalciferol 500-10 MG-MCG TABS Take by mouth.     cloNIDine (CATAPRES) 0.1 MG tablet Take 1 tablet by mouth 2 (two) times daily.     diclofenac Sodium (VOLTAREN) 1 % GEL Apply 0.5 g topically 4 (four) times daily.     diltiazem (TIAZAC) 240 MG 24 hr capsule Take 240 mg by mouth daily.     donepezil (ARICEPT) 10 MG tablet Take 10 mg by mouth daily.     DULoxetine (CYMBALTA)  60 MG capsule Take 60 mg by mouth daily.     finasteride (PROSCAR) 5 MG tablet Take 5 mg by mouth daily.     fluticasone (FLONASE) 50 MCG/ACT nasal spray Place 1 spray into both nostrils daily as needed for allergies or rhinitis.     furosemide (LASIX) 20 MG tablet Take 20 mg by mouth daily.     iron polysaccharides (NIFEREX) 150 MG capsule Take 150 mg by mouth daily.     levothyroxine (SYNTHROID) 50 MCG tablet Take 50 mcg by mouth daily.     lidocaine (LIDODERM) 5 % Place onto the skin.     lisinopril (ZESTRIL) 10 MG tablet Take by mouth.     loratadine (CLARITIN) 10 MG tablet Take 10 mg by mouth daily.     losartan (COZAAR) 50 MG tablet Take 50 mg by mouth daily.     Misc Natural Products (FIBER 7 PO) Take by mouth.     montelukast (SINGULAIR) 10 MG tablet Take 10 mg by mouth at bedtime.     nystatin cream (MYCOSTATIN) Apply 1 application topically 2 (two) times daily.     omeprazole (PRILOSEC) 20  MG capsule Take 1 capsule by mouth daily.     oxybutynin (DITROPAN-XL) 10 MG 24 hr tablet Take 10 mg by mouth daily.     oxyCODONE-acetaminophen (PERCOCET) 10-325 MG tablet Take 1 tablet by mouth every 4 (four) hours as needed for pain.     pregabalin (LYRICA) 75 MG capsule Take 150 mg by mouth at bedtime.     propranolol ER (INDERAL LA) 60 MG 24 hr capsule Take 60 mg by mouth daily.     rOPINIRole (REQUIP) 1 MG tablet Take 1 tablet by mouth daily.     spironolactone (ALDACTONE) 25 MG tablet Take 25 mg by mouth daily.     Suvorexant (BELSOMRA) 15 MG TABS Take 1 tab at night     thiamine (VITAMIN B1) 100 MG tablet Take by mouth.     tiZANidine (ZANAFLEX) 4 MG tablet Take 4 mg by mouth 3 (three) times daily as needed for muscle spasms.     topiramate (TOPAMAX) 25 MG tablet Take 25 mg by mouth 2 (two) times daily.     traZODone (DESYREL) 100 MG tablet Take 100 mg by mouth at bedtime.     Vitamin D, Ergocalciferol, (DRISDOL) 1.25 MG (50000 UNIT) CAPS capsule Take 50,000 Units by mouth once a week.     No current facility-administered medications on file prior to visit.  . Social History   Socioeconomic History   Marital status: Married    Spouse name: Not on file   Number of children: Not on file   Years of education: Not on file   Highest education level: GED or equivalent  Occupational History   Not on file  Tobacco Use   Smoking status: Former   Smokeless tobacco: Never   Tobacco comments:    at age 23  Vaping Use   Vaping status: Never Used  Substance and Sexual Activity   Alcohol use: No   Drug use: No   Sexual activity: Not on file  Other Topics Concern   Not on file  Social History Narrative   Not on file   Social Determinants of Health   Financial Resource Strain: Low Risk  (02/01/2023)   Overall Financial Resource Strain (CARDIA)    Difficulty of Paying Living Expenses: Not very hard  Food Insecurity: No Food Insecurity (02/01/2023)   Hunger Vital Sign  Worried  About Programme researcher, broadcasting/film/video in the Last Year: Never true    Ran Out of Food in the Last Year: Never true  Transportation Needs: No Transportation Needs (02/01/2023)   PRAPARE - Administrator, Civil Service (Medical): No    Lack of Transportation (Non-Medical): No  Physical Activity: Insufficiently Active (02/01/2023)   Exercise Vital Sign    Days of Exercise per Week: 2 days    Minutes of Exercise per Session: 10 min  Stress: No Stress Concern Present (02/01/2023)   [employer] of Occupational Health - Occupational Stress Questionnaire    Feeling of Stress : Only a little  Social Connections: Socially Integrated (02/01/2023)   Social Connection and Isolation Panel [NHANES]    Frequency of Communication with Friends and Family: More than three times a week    Frequency of Social Gatherings with Friends and Family: Twice a week    Attends Religious Services: More than 4 times per year    Active Member of Clubs or Organizations: Yes    Attends Banker Meetings: More than 4 times per year    Marital Status: Married   Past Medical History:  Diagnosis Date   Allergic rhinitis 01/16/2009   Qualifier: Diagnosis of  By: Everardo All MD, Cleophas Dunker   Formatting of this note might be different from the original. Overview:  Qualifier: Diagnosis of  By: Everardo All MD, Gregary Signs A   Arthritis    Chronic migraine w/o aura, not intractable, w/o stat migr    Chronic pain of right knee 06/12/2021   Chronic pain syndrome 07/30/2013   DEGENERATIVE DISC DISEASE, LUMBOSACRAL SPINE 01/16/2009   Qualifier: Diagnosis of  By: Everardo All MD, Sean A    Depression    DIAPHORESIS 01/16/2009   Qualifier: Diagnosis of  By: Everardo All MD, Sean A    Edema 01/16/2009   Qualifier: Diagnosis of  By: Everardo All MD, Cleophas Dunker   Formatting of this note might be different from the original. Overview:  Qualifier: Diagnosis of  By: Everardo All MD, Sean A   Essential hypertension 01/28/2017   Essential thrombocytosis (HCC)  11/25/2017   Fibromyalgia    GERD 01/16/2009   Qualifier: Diagnosis of  By: Everardo All MD, Sean A    Heart failure (HCC) 05/08/2021   Hypertension    HYPERTENSION 01/16/2009   Qualifier: Diagnosis of  By: Everardo All MD, Sean A    Hypokalemia 01/16/2009   Qualifier: Diagnosis of  By: Everardo All MD, Cleophas Dunker   Formatting of this note might be different from the original. Overview:  Qualifier: Diagnosis of  By: Everardo All MD, Sean A   Left hip pain 05/24/2019   Lumbosacral spondylosis 10/09/2010   Lymphedema 02/10/2021   Medication management 08/25/2010   Memory loss 01/16/2009   Qualifier: Diagnosis of  By: Everardo All MD, Cleophas Dunker   Formatting of this note might be different from the original. Overview:  Qualifier: Diagnosis of  By: Everardo All MD, Sean A   Menopausal and postmenopausal disorder 01/16/2009   Qualifier: Diagnosis of  By: Everardo All MD, Cleophas Dunker   Formatting of this note might be different from the original. Overview:  Qualifier: Diagnosis of  By: Everardo All MD, Sean A   Moderate aortic regurgitation 05/21/2019   Neuropathic pain 09/25/2020   Neuropathy    Obesity (BMI 30.0-34.9) 10/13/2021   Pain in soft tissues of limb 06/20/2012   Formatting of this note might be different from the original. Overview:  IMPRESSION: Left knee pain Formatting of  this note might be different from the original. IMPRESSION: Left knee pain   Pain in thoracic spine 07/30/2013   Palpitations 01/28/2017   Primary osteoarthritis of right knee 10/06/2014   Restless legs syndrome 08/25/2010   Shortness of breath on exertion 01/28/2017   SI (sacroiliac) pain 05/10/2016   Thoracic spondylosis 11/20/2010   Overview:  Overview:  IMPRESSION: Thoracic radiculopathy Overview:  IMPRESSION: Thoracic radiculopathy  Formatting of this note might be different from the original. Overview:  IMPRESSION: Thoracic radiculopathy Formatting of this note might be different from the original. IMPRESSION: Thoracic radiculopathy   Thrombocythemia,  essential (HCC) 09/12/2013   Thyroid disease    TMJ (dislocation of temporomandibular joint) 07/30/2013   Tremor 12/29/2021   Unspecified hypothyroidism 01/16/2009   Qualifier: Diagnosis of  By: Everardo All MD, Sean A    Uterine cancer (HCC) 07/30/2013   Family History  Problem Relation Age of Onset   Deep vein thrombosis Father    Heart disease Father    Heart failure Father    Alzheimer's disease Father    Heart disease Brother    Pulmonary embolism Brother    Heart disease Brother     Review of Systems  Constitutional:  Positive for fatigue.  HENT:  Negative for congestion, ear pain and sore throat.   Respiratory:  Negative for cough and shortness of breath.   Cardiovascular:  Positive for chest pain and leg swelling.  Gastrointestinal:  Negative for abdominal pain, constipation, diarrhea, nausea and vomiting.  Genitourinary:  Negative for dysuria, frequency and urgency.  Musculoskeletal:  Positive for arthralgias. Negative for back pain and myalgias.  Neurological:  Negative for dizziness and headaches.  Psychiatric/Behavioral:  Negative for agitation and sleep disturbance. The patient is not nervous/anxious.      Objective:  BP 138/60 (BP Location: Left Arm, Patient Position: Sitting, Cuff Size: Large)   Pulse (!) 48   Temp (!) 97.2 F (36.2 C) (Temporal)   Ht 5\' 4"  (1.626 m)   Wt 210 lb (95.3 kg)   LMP  (LMP Unknown)   SpO2 98%   BMI 36.05 kg/m      02/03/2023    1:29 PM 01/19/2023    3:00 AM 06/16/2022    4:12 PM  BP/Weight  Systolic BP 138  161  Diastolic BP 60  70  Wt. (Lbs) 210 209 218.2  BMI 36.05 kg/m2 35.87 kg/m2 34.17 kg/m2    Physical Exam Vitals reviewed.  Constitutional:      Appearance: Normal appearance. She is obese.  Neck:     Vascular: No carotid bruit.  Cardiovascular:     Rate and Rhythm: Normal rate and regular rhythm.     Heart sounds: Normal heart sounds.  Pulmonary:     Effort: Pulmonary effort is normal. No respiratory distress.      Breath sounds: Normal breath sounds.  Abdominal:     General: Abdomen is flat. Bowel sounds are normal.     Palpations: Abdomen is soft.     Tenderness: There is no abdominal tenderness.  Musculoskeletal:     Right lower leg: Edema present.     Left lower leg: Edema present.  Neurological:     Mental Status: She is alert and oriented to person, place, and time.  Psychiatric:        Mood and Affect: Mood normal.        Behavior: Behavior normal.    Diabetic Foot Exam - Simple   No data filed  Lab Results  Component Value Date   WBC 8.8 02/03/2023   HGB 13.6 02/03/2023   HCT 40.9 02/03/2023   PLT 445 02/03/2023   GLUCOSE 89 02/03/2023   CHOL 209 (H) 02/03/2023   TRIG 155 (H) 02/03/2023   HDL 44 02/03/2023   LDLCALC 137 (H) 02/03/2023   ALT 21 02/03/2023   AST 22 02/03/2023   NA 136 02/03/2023   K 6.1 (HH) 02/03/2023   CL 92 (L) 02/03/2023   CREATININE 0.98 02/03/2023   BUN 15 02/03/2023   CO2 26 02/03/2023   TSH 1.870 02/03/2023   HGBA1C 5.5 02/03/2023      Assessment & Plan:  Chronic heart failure with preserved ejection fraction (HCC) Management per specialist Order EKG  Essential hypertension Well controlled.  Taking amlodipine 5 mg daily, aspirin 81 mg daily, clonidine 0.1 mg twice a day, diltiazem 240 mg daily, furosemide 20 mg daily, lisinopril 10 mg daily, losartan 50 mg daily, spironolactone 25 mg daily. I will have my nurse clarify as she should not be on both lisinopril and losartan.  Continue to work on eating a healthy diet and exercise.  Labs drawn today.    Hypothyroidism Previously well controlled Continue Synthroid at current dose  Recheck TSH and adjust Synthroid as indicated    Essential thrombocytosis (HCC) Check labs  Mixed hyperlipidemia Await labs/testing for recommendations.   Prediabetes Recommend continue to work on eating healthy diet and exercise.   Other chest pain Check EKG. No changes. Patient is to call Dr.  Ledell Noss office to get a follow up in the next 1-2 weeks.     Meds ordered this encounter  Medications   nitroGLYCERIN (NITROSTAT) 0.4 MG SL tablet    Sig: Place 1 tablet (0.4 mg total) under the tongue as needed for chest pain.    Dispense:  25 tablet    Refill:  3   Orders Placed This Encounter  Procedures   Flu Vaccine Trivalent High Dose (Fluad)   Pfizer Comirnaty Covid-19 Vaccine 32yrs & older   CBC with Differential/Platelet   Comprehensive metabolic panel   T4, free   TSH   Hemoglobin A1c   Lipid panel   EKG 12-Lead      Follow-up: Return in about 8 weeks (around 03/31/2023) for 40 minutes please.  AVS was given to patient prior to departure.  Total time spent on today's visit was greater than 40 minutes, including both face-to-face time and nonface-to-face time personally spent on review of chart (labs and imaging), discussing labs and goals, discussing further work-up, treatment options, referrals to specialist if needed, reviewing outside records of pertinent, answering patient's questions, and coordinating care.  Clayborn Bigness I Leal-Borjas,acting as a scribe for Blane Ohara, MD.,have documented all relevant documentation on the behalf of Blane Ohara, MD,as directed by  Blane Ohara, MD while in the presence of Blane Ohara, MD.   I attest that I have reviewed this visit and agree with the plan scribed by my staff.   Blane Ohara, MD [facility] [phone]

## 2023-02-04 ENCOUNTER — Other Ambulatory Visit: Payer: Self-pay

## 2023-02-04 DIAGNOSIS — E875 Hyperkalemia: Secondary | ICD-10-CM

## 2023-02-04 LAB — COMPREHENSIVE METABOLIC PANEL
ALT: 21 [IU]/L (ref 0–32)
AST: 22 [IU]/L (ref 0–40)
Albumin: 4.4 g/dL (ref 3.8–4.8)
Alkaline Phosphatase: 96 [IU]/L (ref 44–121)
BUN/Creatinine Ratio: 15 (ref 12–28)
BUN: 15 mg/dL (ref 8–27)
Bilirubin Total: 0.4 mg/dL (ref 0.0–1.2)
CO2: 26 mmol/L (ref 20–29)
Calcium: 10.5 mg/dL — ABNORMAL HIGH (ref 8.7–10.3)
Chloride: 92 mmol/L — ABNORMAL LOW (ref 96–106)
Creatinine, Ser: 0.98 mg/dL (ref 0.57–1.00)
Globulin, Total: 2.8 g/dL (ref 1.5–4.5)
Glucose: 89 mg/dL (ref 70–99)
Potassium: 6.1 mmol/L (ref 3.5–5.2)
Sodium: 136 mmol/L (ref 134–144)
Total Protein: 7.2 g/dL (ref 6.0–8.5)
eGFR: 60 mL/min/{1.73_m2} (ref >59)

## 2023-02-04 LAB — TSH: TSH: 1.87 u[IU]/mL (ref 0.450–4.500)

## 2023-02-04 LAB — LIPID PANEL
Chol/HDL Ratio: 4.8 {ratio} — ABNORMAL HIGH (ref 0.0–4.4)
Cholesterol, Total: 209 mg/dL — ABNORMAL HIGH (ref 100–199)
HDL: 44 mg/dL (ref >39)
LDL Chol Calc (NIH): 137 mg/dL — ABNORMAL HIGH (ref 0–99)
Triglycerides: 155 mg/dL — ABNORMAL HIGH (ref 0–149)
VLDL Cholesterol Cal: 28 mg/dL (ref 5–40)

## 2023-02-04 LAB — CBC WITH DIFFERENTIAL/PLATELET
Basophils Absolute: 0 10*3/uL (ref 0.0–0.2)
Basos: 1 %
EOS (ABSOLUTE): 0.2 10*3/uL (ref 0.0–0.4)
Eos: 2 %
Hematocrit: 40.9 % (ref 34.0–46.6)
Hemoglobin: 13.6 g/dL (ref 11.1–15.9)
Immature Grans (Abs): 0 10*3/uL (ref 0.0–0.1)
Immature Granulocytes: 0 %
Lymphocytes Absolute: 3.1 10*3/uL (ref 0.7–3.1)
Lymphs: 35 %
MCH: 31.3 pg (ref 26.6–33.0)
MCHC: 33.3 g/dL (ref 31.5–35.7)
MCV: 94 fL (ref 79–97)
Monocytes Absolute: 1 10*3/uL — ABNORMAL HIGH (ref 0.1–0.9)
Monocytes: 11 %
Neutrophils Absolute: 4.5 10*3/uL (ref 1.4–7.0)
Neutrophils: 51 %
Platelets: 445 10*3/uL (ref 150–450)
RBC: 4.35 x10E6/uL (ref 3.77–5.28)
RDW: 12.5 % (ref 11.7–15.4)
WBC: 8.8 10*3/uL (ref 3.4–10.8)

## 2023-02-04 LAB — HEMOGLOBIN A1C
Est. average glucose Bld gHb Est-mCnc: 111 mg/dL
Hgb A1c MFr Bld: 5.5 % (ref 4.8–5.6)

## 2023-02-04 LAB — T4, FREE: Free T4: 1.09 ng/dL (ref 0.82–1.77)

## 2023-02-05 DIAGNOSIS — E782 Mixed hyperlipidemia: Secondary | ICD-10-CM | POA: Insufficient documentation

## 2023-02-05 DIAGNOSIS — R7303 Prediabetes: Secondary | ICD-10-CM | POA: Insufficient documentation

## 2023-02-05 NOTE — Assessment & Plan Note (Signed)
Management per specialist Order EKG

## 2023-02-06 DIAGNOSIS — R0789 Other chest pain: Secondary | ICD-10-CM | POA: Insufficient documentation

## 2023-02-06 DIAGNOSIS — Z13820 Encounter for screening for osteoporosis: Secondary | ICD-10-CM | POA: Insufficient documentation

## 2023-02-06 DIAGNOSIS — Z23 Encounter for immunization: Secondary | ICD-10-CM | POA: Insufficient documentation

## 2023-02-06 NOTE — Assessment & Plan Note (Addendum)
Await labs/testing for recommendations.

## 2023-02-06 NOTE — Assessment & Plan Note (Signed)
Check labs 

## 2023-02-06 NOTE — Assessment & Plan Note (Signed)
Previously well controlled Continue Synthroid at current dose  Recheck TSH and adjust Synthroid as indicated   

## 2023-02-06 NOTE — Assessment & Plan Note (Signed)
Check EKG. No changes. Patient is to call Dr. Ledell Noss office to get a follow up in the next 1-2 weeks.

## 2023-02-06 NOTE — Assessment & Plan Note (Addendum)
Well controlled.  Taking amlodipine 5 mg daily, aspirin 81 mg daily, clonidine 0.1 mg twice a day, diltiazem 240 mg daily, furosemide 20 mg daily, lisinopril 10 mg daily, losartan 50 mg daily, spironolactone 25 mg daily. I will have my nurse clarify as she should not be on both lisinopril and losartan.  Continue to work on eating a healthy diet and exercise.  Labs drawn today.

## 2023-02-06 NOTE — Assessment & Plan Note (Signed)
Recommend continue to work on eating healthy diet and exercise.  

## 2023-02-07 ENCOUNTER — Encounter: Payer: Self-pay | Admitting: Family Medicine

## 2023-02-07 ENCOUNTER — Ambulatory Visit (INDEPENDENT_AMBULATORY_CARE_PROVIDER_SITE_OTHER): Payer: Medicare HMO | Admitting: Family Medicine

## 2023-02-07 ENCOUNTER — Other Ambulatory Visit: Payer: Self-pay

## 2023-02-07 VITALS — BP 150/62 | HR 70 | Temp 97.7°F | Resp 16 | Ht 64.0 in | Wt 210.2 lb

## 2023-02-07 DIAGNOSIS — J0110 Acute frontal sinusitis, unspecified: Secondary | ICD-10-CM | POA: Diagnosis not present

## 2023-02-07 DIAGNOSIS — E875 Hyperkalemia: Secondary | ICD-10-CM

## 2023-02-07 MED ORDER — AMOXICILLIN-POT CLAVULANATE 875-125 MG PO TABS
1.0000 | ORAL_TABLET | Freq: Two times a day (BID) | ORAL | 0 refills | Status: DC
Start: 2023-02-07 — End: 2023-05-24

## 2023-02-07 MED ORDER — FLUCONAZOLE 150 MG PO TABS: 150.0000 mg | ORAL_TABLET | Freq: Every day | ORAL | 0 refills | Status: AC

## 2023-02-07 NOTE — Assessment & Plan Note (Signed)
Acute Increase fluid intake, rest and practice good hand hygeine Throw away your toothbrush and start using a new one Augmentin 875-125 mg tablet by mouth TWICE A DAY for 10 days Diflucan 150 mg by mouth once as needed for itching. Albuterol 2 puffs into the lungs every 6 hours as needed for wheezing or shortness of breath. Rinse your mouth with water afterwards Try lemon and honey and/or cough drops for cough

## 2023-02-07 NOTE — Assessment & Plan Note (Signed)
Acute Hold spirolactone and potassium containing foods Labs drawn today Await labs/testing for assessment and recommendations.

## 2023-02-07 NOTE — Progress Notes (Signed)
Established Patient Office Visit  Subjective   Patient ID: Lauren Roth, female    DOB: 29-Jul-1945  Age: 77 y.o. MRN: [id number]  Chief Complaint  Patient presents with   Sinus Problem    Sinus: Patient in today with symptoms of congestion, coughing and headache. Patient states that the symptoms have been going on for at least three weeks. She said that the symptoms have gotten worse and worse. Takes daily Claritin, Flonase, and Singulair for allergies and it has helped. Patient states that she gets a stabbing pain in her forehead when she bends down and facial pain 4/10.  Hyperkalemia: Patient had a potassium value on 02/03/23 of 6.1 She came today to check her potassium level She had been feeling terrible with muscle weakness this [place], but things are slightly better since she stopped taking her spironolactone and eating bananas as discussed on 02/04/23.  Patient Active Problem List   Diagnosis Date Noted   Hyperkalemia 02/07/2023   Acute non-recurrent frontal sinusitis 02/07/2023   Encounter for immunization 02/06/2023   Other chest pain 02/06/2023   Prediabetes 02/05/2023   Mixed hyperlipidemia 02/05/2023   Corns of multiple toes 01/11/2023   Toe pain, left 01/11/2023   Toe pain, right 01/11/2023   GAD (generalized anxiety disorder) 12/08/2022   SVT (supraventricular tachycardia) (HCC) 08/25/2022   Tremor 12/29/2021   Obesity (BMI 30.0-34.9) 10/13/2021   Chronic pain of right knee 06/12/2021   Chronic heart failure with preserved ejection fraction (HCC) 05/08/2021   Lymphedema 02/10/2021   Neuropathy 03/10/2020   Thyroid disease    Left hip pain 05/24/2019   Moderate aortic regurgitation 05/21/2019   Pre-operative cardiovascular examination 11/25/2017   Essential thrombocytosis (HCC) 11/25/2017   Palpitations 01/28/2017   Shortness of breath on exertion 01/28/2017   SI (sacroiliac) pain 05/10/2016   Primary osteoarthritis  of right knee 10/06/2014   Closed fracture of left tibia 10/06/2014   History of total knee arthroplasty 10/06/2014   Thrombocythemia, essential (HCC) 09/12/2013   Uterine cancer (HCC) 07/30/2013   TMJ (dislocation of temporomandibular joint) 07/30/2013   Pain in thoracic spine 07/30/2013   Chronic pain syndrome 07/30/2013   Arthritis 07/30/2013   Disorder of sacrum 07/30/2013   Arthropathy 07/30/2013   Cervical post-laminectomy syndrome 07/30/2013   Pain in soft tissues of limb 06/20/2012   Thoracic spondylosis 11/20/2010   Lumbosacral spondylosis 10/09/2010   Restless legs syndrome 08/25/2010   Medication management 08/25/2010   Fibromyalgia 08/25/2010   Hypothyroidism 01/16/2009   Hypokalemia 01/16/2009   DEPRESSION 01/16/2009   Essential hypertension 01/16/2009   Allergic rhinitis 01/16/2009   GERD 01/16/2009   Menopausal and postmenopausal disorder 01/16/2009   DEGENERATIVE DISC DISEASE, LUMBOSACRAL SPINE 01/16/2009   DIAPHORESIS 01/16/2009   Memory loss 01/16/2009   Edema 01/16/2009   Diaphoresis 01/16/2009   Depression 01/16/2009   Past Medical History:  Diagnosis Date   Allergic rhinitis 01/16/2009   Qualifier: Diagnosis of  By: Everardo All MD, Cleophas Dunker   Formatting of this note might be different from the original. Overview:  Qualifier: Diagnosis of  By: Everardo All MD, Gregary Signs A   Arthritis    Chronic migraine w/o aura, not intractable, w/o stat migr    Chronic pain of right knee 06/12/2021   Chronic pain syndrome 07/30/2013   DEGENERATIVE DISC DISEASE, LUMBOSACRAL SPINE 01/16/2009   Qualifier: Diagnosis of  By: Everardo All MD, Sean A    Depression    DIAPHORESIS 01/16/2009  Qualifier: Diagnosis of  By: Everardo All MD, Gregary Signs A    Edema 01/16/2009   Qualifier: Diagnosis of  By: Everardo All MD, Clayborn Heron of this note might be different from the original. Overview:  Qualifier: Diagnosis of  By: Everardo All MD, Sean A   Essential hypertension 01/28/2017   Essential thrombocytosis  (HCC) 11/25/2017   Fibromyalgia    GERD 01/16/2009   Qualifier: Diagnosis of  By: Everardo All MD, Sean A    Heart failure (HCC) 05/08/2021   Hypertension    HYPERTENSION 01/16/2009   Qualifier: Diagnosis of  By: Everardo All MD, Sean A    Hypokalemia 01/16/2009   Qualifier: Diagnosis of  By: Everardo All MD, Cleophas Dunker   Formatting of this note might be different from the original. Overview:  Qualifier: Diagnosis of  By: Everardo All MD, Sean A   Left hip pain 05/24/2019   Lumbosacral spondylosis 10/09/2010   Lymphedema 02/10/2021   Medication management 08/25/2010   Memory loss 01/16/2009   Qualifier: Diagnosis of  By: Everardo All MD, Cleophas Dunker   Formatting of this note might be different from the original. Overview:  Qualifier: Diagnosis of  By: Everardo All MD, Sean A   Menopausal and postmenopausal disorder 01/16/2009   Qualifier: Diagnosis of  By: Everardo All MD, Cleophas Dunker   Formatting of this note might be different from the original. Overview:  Qualifier: Diagnosis of  By: Everardo All MD, Sean A   Moderate aortic regurgitation 05/21/2019   Neuropathic pain 09/25/2020   Neuropathy    Obesity (BMI 30.0-34.9) 10/13/2021   Pain in soft tissues of limb 06/20/2012   Formatting of this note might be different from the original. Overview:  IMPRESSION: Left knee pain Formatting of this note might be different from the original. IMPRESSION: Left knee pain   Pain in thoracic spine 07/30/2013   Palpitations 01/28/2017   Primary osteoarthritis of right knee 10/06/2014   Restless legs syndrome 08/25/2010   Shortness of breath on exertion 01/28/2017   SI (sacroiliac) pain 05/10/2016   Thoracic spondylosis 11/20/2010   Overview:  Overview:  IMPRESSION: Thoracic radiculopathy Overview:  IMPRESSION: Thoracic radiculopathy  Formatting of this note might be different from the original. Overview:  IMPRESSION: Thoracic radiculopathy Formatting of this note might be different from the original. IMPRESSION: Thoracic radiculopathy    Thrombocythemia, essential (HCC) 09/12/2013   Thyroid disease    TMJ (dislocation of temporomandibular joint) 07/30/2013   Tremor 12/29/2021   Unspecified hypothyroidism 01/16/2009   Qualifier: Diagnosis of  By: Everardo All MD, Sean A    Uterine cancer (HCC) 07/30/2013   Past Surgical History:  Procedure Laterality Date   APPENDECTOMY     CERVICAL DISC SURGERY     CHOLECYSTECTOMY     FRACTURE SURGERY Left    Left arm has a metal plate   KNEE SURGERY Bilateral    TKR   ROBOTIC ASSISTED LAP VAGINAL HYSTERECTOMY     did not take ovaries. had uterine cancer.   Social History   Tobacco Use   Smoking status: Former   Smokeless tobacco: Never   Tobacco comments:    at age 8  Vaping Use   Vaping status: Never Used  Substance Use Topics   Alcohol use: No   Drug use: No   Social History   Socioeconomic History   Marital status: Married    Spouse name: Not on file   Number of children: Not on file   Years of education: Not on file   Highest education level:  GED or equivalent  Occupational History   Not on file  Tobacco Use   Smoking status: Former   Smokeless tobacco: Never   Tobacco comments:    at age 64  Vaping Use   Vaping status: Never Used  Substance and Sexual Activity   Alcohol use: No   Drug use: No   Sexual activity: Not on file  Other Topics Concern   Not on file  Social History Narrative   Not on file   Social Determinants of Health   Financial Resource Strain: Low Risk  (02/01/2023)   Overall Financial Resource Strain (CARDIA)    Difficulty of Paying Living Expenses: Not very hard  Food Insecurity: No Food Insecurity (02/01/2023)   Hunger Vital Sign    Worried About Running Out of Food in the Last Year: Never true    Ran Out of Food in the Last Year: Never true  Transportation Needs: No Transportation Needs (02/01/2023)   PRAPARE - Administrator, Civil Service (Medical): No    Lack of Transportation (Non-Medical): No  Physical  Activity: Insufficiently Active (02/01/2023)   Exercise Vital Sign    Days of Exercise per Week: 2 days    Minutes of Exercise per Session: 10 min  Stress: No Stress Concern Present (02/01/2023)   [employer] of Occupational Health - Occupational Stress Questionnaire    Feeling of Stress : Only a little  Social Connections: Socially Integrated (02/01/2023)   Social Connection and Isolation Panel [NHANES]    Frequency of Communication with Friends and Family: More than three times a week    Frequency of Social Gatherings with Friends and Family: Twice a week    Attends Religious Services: More than 4 times per year    Active Member of [employer] or Organizations: Yes    Attends Engineer, structural: More than 4 times per year    Marital Status: Married  Catering manager Violence: Not on file   Family Status  Relation Name Status   Mother  Deceased at age 19s       MVA   Father  Deceased   Brother  (Not Specified)   Brother  (Not Specified)   MGM  Deceased   MGF  Deceased   PGM  Deceased   PGF  Deceased  No partnership data on file   Family History  Problem Relation Age of Onset   Deep vein thrombosis Father    Heart disease Father    Heart failure Father    Alzheimer's disease Father    Heart disease Brother    Pulmonary embolism Brother    Heart disease Brother    Allergies  Allergen Reactions   Cottonseed Oil Hives and Itching    Hives, Itching Raw Cotton    Codeine Sulfate     Review of Systems  Constitutional:  Negative for chills and fever.  HENT:  Positive for congestion and sinus pain.   Respiratory:  Positive for cough and wheezing. Negative for shortness of breath.   Cardiovascular:  Negative for chest pain.  Gastrointestinal:  Negative for abdominal pain, diarrhea, nausea and vomiting.  Genitourinary:  Negative for frequency and urgency.  Neurological:  Positive for headaches. Negative for dizziness, tingling and weakness.      Objective:      BP (!) 150/62 (BP Location: Left Arm, Patient Position: Sitting, Cuff Size: Large)   Pulse 70   Temp 97.7 F (36.5 C) (Temporal)   Resp 16  Ht 5\' 4"  (1.626 m)   Wt 210 lb 3.2 oz (95.3 kg)   LMP  (LMP Unknown)   SpO2 96%   BMI 36.08 kg/m  BP Readings from Last 3 Encounters:  02/07/23 (!) 150/62  02/03/23 138/60  06/16/22 (!) 172/70   Wt Readings from Last 3 Encounters:  02/07/23 210 lb 3.2 oz (95.3 kg)  02/03/23 210 lb (95.3 kg)  01/19/23 209 lb (94.8 kg)      Physical Exam Constitutional:      General: She is not in acute distress.    Appearance: Normal appearance. She is ill-appearing.  HENT:     Right Ear: Tympanic membrane normal.     Left Ear: Tympanic membrane normal.     Nose: Congestion present.     Right Turbinates: Pale.     Left Turbinates: Pale.     Right Sinus: Maxillary sinus tenderness and frontal sinus tenderness present.     Left Sinus: Maxillary sinus tenderness and frontal sinus tenderness present.     Mouth/Throat:     Mouth: Mucous membranes are moist.     Pharynx: No posterior oropharyngeal erythema.  Neck:     Vascular: No carotid bruit.  Cardiovascular:     Rate and Rhythm: Normal rate and regular rhythm.     Heart sounds: Normal heart sounds.  Pulmonary:     Effort: Pulmonary effort is normal.     Breath sounds: Normal breath sounds.  Abdominal:     General: Bowel sounds are normal.     Palpations: Abdomen is soft.     Tenderness: There is no abdominal tenderness.  Skin:    General: Skin is warm.  Neurological:     Mental Status: She is alert. Mental status is at baseline.  Psychiatric:        Mood and Affect: Mood normal.        Behavior: Behavior normal.      No results found for any visits on 02/07/23.  Last CBC Lab Results  Component Value Date   WBC 8.8 02/03/2023   HGB 13.6 02/03/2023   HCT 40.9 02/03/2023   MCV 94 02/03/2023   MCH 31.3 02/03/2023   RDW 12.5 02/03/2023   PLT 445 02/03/2023   Last metabolic  panel Lab Results  Component Value Date   GLUCOSE 89 02/03/2023   NA 136 02/03/2023   K 6.1 (HH) 02/03/2023   CL 92 (L) 02/03/2023   CO2 26 02/03/2023   BUN 15 02/03/2023   CREATININE 0.98 02/03/2023   EGFR 60 02/03/2023   CALCIUM 10.5 (H) 02/03/2023   PROT 7.2 02/03/2023   ALBUMIN 4.4 02/03/2023   LABGLOB 2.8 02/03/2023   BILITOT 0.4 02/03/2023   ALKPHOS 96 02/03/2023   AST 22 02/03/2023   ALT 21 02/03/2023   Last lipids Lab Results  Component Value Date   CHOL 209 (H) 02/03/2023   HDL 44 02/03/2023   LDLCALC 137 (H) 02/03/2023   TRIG 155 (H) 02/03/2023   CHOLHDL 4.8 (H) 02/03/2023   Last hemoglobin A1c Lab Results  Component Value Date   HGBA1C 5.5 02/03/2023      The 10-year ASCVD risk score (Arnett DK, et al., 2019) is: 27.8%    Assessment & Plan:   Problem List Items Addressed This Visit       Respiratory   Acute non-recurrent frontal sinusitis - Primary    Acute Increase fluid intake, rest and practice good hand hygeine Throw away your toothbrush and start using  a new one Augmentin 875-125 mg tablet by mouth TWICE A DAY for 10 days Diflucan 150 mg by mouth once as needed for itching. Albuterol 2 puffs into the lungs every 6 hours as needed for wheezing or shortness of breath. Rinse your mouth with water afterwards Try lemon and honey and/or cough drops for cough       Relevant Medications   amoxicillin-clavulanate (AUGMENTIN) 875-125 MG tablet   fluconazole (DIFLUCAN) 150 MG tablet     Other   Hyperkalemia    Acute Hold spirolactone and potassium containing foods Labs drawn today Await labs/testing for assessment and recommendations.        Follow-up: Return if symptoms worsen or fail to improve.   Total time spent on today's visit was greater than 30 minutes, including both face-to-face time and nonface-to-face time personally spent on review of chart (labs and imaging), discussing labs and goals, discussing further work-up, treatment  options, referrals to specialist if needed, reviewing outside records of pertinent, answering patient's questions, and coordinating care.   Lajuana Matte, FNP [facility] [phone]

## 2023-02-08 LAB — COMPREHENSIVE METABOLIC PANEL
ALT: 22 [IU]/L (ref 0–32)
AST: 18 [IU]/L (ref 0–40)
Albumin: 4 g/dL (ref 3.8–4.8)
Alkaline Phosphatase: 83 [IU]/L (ref 44–121)
BUN/Creatinine Ratio: 13 (ref 12–28)
BUN: 12 mg/dL (ref 8–27)
Bilirubin Total: 0.2 mg/dL (ref 0.0–1.2)
CO2: 27 mmol/L (ref 20–29)
Calcium: 9.4 mg/dL (ref 8.7–10.3)
Chloride: 94 mmol/L — ABNORMAL LOW (ref 96–106)
Creatinine, Ser: 0.89 mg/dL (ref 0.57–1.00)
Globulin, Total: 2.9 g/dL (ref 1.5–4.5)
Glucose: 81 mg/dL (ref 70–99)
Potassium: 4.5 mmol/L (ref 3.5–5.2)
Sodium: 138 mmol/L (ref 134–144)
Total Protein: 6.9 g/dL (ref 6.0–8.5)
eGFR: 68 mL/min/{1.73_m2} (ref >59)

## 2023-02-17 ENCOUNTER — Ambulatory Visit: Payer: PPO

## 2023-02-18 DIAGNOSIS — I110 Hypertensive heart disease with heart failure: Secondary | ICD-10-CM | POA: Diagnosis not present

## 2023-02-18 DIAGNOSIS — R001 Bradycardia, unspecified: Secondary | ICD-10-CM | POA: Diagnosis not present

## 2023-02-18 DIAGNOSIS — I440 Atrioventricular block, first degree: Secondary | ICD-10-CM | POA: Diagnosis not present

## 2023-02-18 DIAGNOSIS — I471 Supraventricular tachycardia, unspecified: Secondary | ICD-10-CM | POA: Diagnosis not present

## 2023-02-18 DIAGNOSIS — I351 Nonrheumatic aortic (valve) insufficiency: Secondary | ICD-10-CM | POA: Diagnosis not present

## 2023-02-18 DIAGNOSIS — I5032 Chronic diastolic (congestive) heart failure: Secondary | ICD-10-CM | POA: Diagnosis not present

## 2023-02-28 ENCOUNTER — Other Ambulatory Visit: Payer: Self-pay

## 2023-02-28 MED ORDER — DONEPEZIL HCL 10 MG PO TABS: 10.0000 mg | ORAL_TABLET | Freq: Every day | ORAL | 1 refills | Status: DC

## 2023-03-03 ENCOUNTER — Other Ambulatory Visit: Payer: Self-pay

## 2023-03-03 DIAGNOSIS — E039 Hypothyroidism, unspecified: Secondary | ICD-10-CM

## 2023-03-03 MED ORDER — LEVOTHYROXINE SODIUM 50 MCG PO TABS: 50.0000 ug | ORAL_TABLET | Freq: Every day | ORAL | 1 refills | Status: DC

## 2023-03-21 ENCOUNTER — Ambulatory Visit (INDEPENDENT_AMBULATORY_CARE_PROVIDER_SITE_OTHER): Payer: Medicare HMO

## 2023-03-21 VITALS — Ht 64.0 in | Wt 200.0 lb

## 2023-03-21 DIAGNOSIS — Z0000 Encounter for general adult medical examination without abnormal findings: Secondary | ICD-10-CM | POA: Diagnosis not present

## 2023-03-21 NOTE — Patient Instructions (Addendum)
Ms. Lauren Roth , Thank you for taking time to come for your Medicare Wellness Visit. I appreciate your ongoing commitment to your health goals. Please review the following plan we discussed and let me know if I can assist you in the future.   Referrals/Orders/Follow-Ups/Clinician Recommendations: No  This is a list of the screening recommended for you and due dates:  Health Maintenance  Topic Date Due   Hepatitis C Screening  Never done   DTaP/Tdap/Td vaccine (1 - Tdap) Never done   Zoster (Shingles) Vaccine (1 of 2) Never done   DEXA scan (bone density measurement)  Never done   Pneumonia Vaccine (2 of 2 - PCV) 04/09/2022   COVID-19 Vaccine (5 - 2023-24 season) 03/31/2023   Medicare Annual Wellness Visit  03/20/2024   Flu Shot  Completed   HPV Vaccine  Aged Out   Colon Cancer Screening  Discontinued    Advanced directives: (Declined) Advance directive discussed with you today. Even though you declined this today, please call our office should you change your mind, and we can give you the proper paperwork for you to fill out.  Next Medicare Annual Wellness Visit scheduled for next year: Yes  *Preventive Care attachment * FALL PREVENTION attachment

## 2023-03-21 NOTE — Progress Notes (Signed)
Subjective:   Lauren Roth is a 77 y.o. female who presents for Medicare Annual (Subsequent) preventive examination.  Visit Complete: Virtual I connected with  [name] on 03/21/23 by a audio enabled telemedicine application and verified that I am speaking with the correct person using two identifiers.  Patient Location: Home  Provider Location: Home Office  I discussed the limitations of evaluation and management by telemedicine. The patient expressed understanding and agreed to proceed.  Vital Signs: Because this visit was a virtual/telehealth visit, some criteria may be missing or patient reported. Any vitals not documented were not able to be obtained and vitals that have been documented are patient reported.  Cardiac Risk Factors include: advanced age (>64men, >3 women);family history of premature cardiovascular disease;hypertension;sedentary lifestyle     Objective:    Today's Vitals   03/21/23 1425  Weight: 200 lb (90.7 kg)  Height: 5\' 4"  (1.626 m)  PainSc: 8   PainLoc: Hand   Body mass index is 34.33 kg/m.     03/21/2023    2:28 PM 05/29/2021    3:01 PM 11/27/2020    4:15 PM 05/23/2020    4:21 PM  Advanced Directives  Does Patient Have a Medical Advance Directive? No No No No  Would patient like information on creating a medical advance directive? No - Patient declined No - Patient declined  No - Patient declined    Current Medications (verified) Outpatient Encounter Medications as of 03/21/2023  Medication Sig   albuterol (VENTOLIN HFA) 108 (90 Base) MCG/ACT inhaler Inhale 2 puffs into the lungs every 6 (six) hours as needed for wheezing or shortness of breath.   amoxicillin-clavulanate (AUGMENTIN) 875-125 MG tablet Take 1 tablet by mouth 2 (two) times daily.   aspirin 81 MG chewable tablet Chew 81 mg by mouth daily.   Biotin 10 MG TABS Take by mouth.   buprenorphine (SUBUTEX) 2 MG SUBL SL tablet Place 2 mg under the tongue daily.    buPROPion (WELLBUTRIN XL) 300 MG 24 hr tablet Take 1 tablet by mouth daily.   Calcium Carb-Cholecalciferol 500-10 MG-MCG TABS Take by mouth.   cloNIDine (CATAPRES) 0.1 MG tablet Take 1 tablet by mouth 2 (two) times daily.   diclofenac Sodium (VOLTAREN) 1 % GEL Apply 0.5 g topically 4 (four) times daily.   diltiazem (TIAZAC) 240 MG 24 hr capsule Take 240 mg by mouth daily.   donepezil (ARICEPT) 10 MG tablet Take 1 tablet (10 mg total) by mouth daily.   DULoxetine (CYMBALTA) 60 MG capsule Take 60 mg by mouth daily.   ergocalciferol (VITAMIN D2) 1.25 MG (50000 UT) capsule Take 50,000 Units by mouth once a week.   finasteride (PROSCAR) 5 MG tablet Take 5 mg by mouth daily.   fluticasone (FLONASE) 50 MCG/ACT nasal spray Place 1 spray into both nostrils daily as needed for allergies or rhinitis.   furosemide (LASIX) 20 MG tablet Take 20 mg by mouth daily.   iron polysaccharides (NIFEREX) 150 MG capsule Take 150 mg by mouth daily.   levothyroxine (SYNTHROID) 50 MCG tablet Take 1 tablet (50 mcg total) by mouth daily.   lidocaine (LIDODERM) 5 % Place onto the skin.   loratadine (CLARITIN) 10 MG tablet Take 10 mg by mouth daily.   losartan (COZAAR) 50 MG tablet Take 50 mg by mouth daily.   Misc Natural Products (FIBER 7 PO) Take by mouth.   montelukast (SINGULAIR) 10 MG tablet Take 10 mg by mouth at bedtime.  nitroGLYCERIN (NITROSTAT) 0.4 MG SL tablet Place 1 tablet (0.4 mg total) under the tongue as needed for chest pain.   nystatin cream (MYCOSTATIN) Apply 1 application topically 2 (two) times daily.   omeprazole (PRILOSEC) 20 MG capsule Take 1 capsule by mouth daily.   oxybutynin (DITROPAN-XL) 10 MG 24 hr tablet Take 10 mg by mouth daily.   oxyCODONE-acetaminophen (PERCOCET) 10-325 MG tablet Take 1 tablet by mouth every 4 (four) hours as needed for pain.   pregabalin (LYRICA) 75 MG capsule Take 150 mg by mouth at bedtime.   propranolol ER (INDERAL LA) 60 MG 24 hr capsule Take 60 mg by mouth daily.    rOPINIRole (REQUIP) 1 MG tablet Take 1 tablet by mouth daily.   Suvorexant (BELSOMRA) 15 MG TABS Take 1 tab at night   thiamine (VITAMIN B1) 100 MG tablet Take by mouth.   tiZANidine (ZANAFLEX) 4 MG tablet Take 4 mg by mouth 3 (three) times daily as needed for muscle spasms.   topiramate (TOPAMAX) 25 MG tablet Take 25 mg by mouth 2 (two) times daily.   traZODone (DESYREL) 100 MG tablet Take 100 mg by mouth at bedtime.   Vitamin D, Ergocalciferol, (DRISDOL) 1.25 MG (50000 UNIT) CAPS capsule Take 50,000 Units by mouth once a week.   No facility-administered encounter medications on file as of 03/21/2023.    Allergies (verified) Cottonseed oil and Codeine sulfate   History: Past Medical History:  Diagnosis Date   Allergic rhinitis 01/16/2009   Qualifier: Diagnosis of  By: Everardo All MD, Clayborn Heron of this note might be different from the original. Overview:  Qualifier: Diagnosis of  By: Everardo All MD, Gregary Signs A   Arthritis    Chronic migraine w/o aura, not intractable, w/o stat migr    Chronic pain of right knee 06/12/2021   Chronic pain syndrome 07/30/2013   DEGENERATIVE DISC DISEASE, LUMBOSACRAL SPINE 01/16/2009   Qualifier: Diagnosis of  By: Everardo All MD, Sean A    Depression    DIAPHORESIS 01/16/2009   Qualifier: Diagnosis of  By: Everardo All MD, Sean A    Edema 01/16/2009   Qualifier: Diagnosis of  By: Everardo All MD, Cleophas Dunker   Formatting of this note might be different from the original. Overview:  Qualifier: Diagnosis of  By: Everardo All MD, Sean A   Essential hypertension 01/28/2017   Essential thrombocytosis (HCC) 11/25/2017   Fibromyalgia    GERD 01/16/2009   Qualifier: Diagnosis of  By: Everardo All MD, Sean A    Heart failure (HCC) 05/08/2021   Hypertension    HYPERTENSION 01/16/2009   Qualifier: Diagnosis of  By: Everardo All MD, Sean A    Hypokalemia 01/16/2009   Qualifier: Diagnosis of  By: Everardo All MD, Cleophas Dunker   Formatting of this note might be different from the original. Overview:   Qualifier: Diagnosis of  By: Everardo All MD, Sean A   Left hip pain 05/24/2019   Lumbosacral spondylosis 10/09/2010   Lymphedema 02/10/2021   Medication management 08/25/2010   Memory loss 01/16/2009   Qualifier: Diagnosis of  By: Everardo All MD, Cleophas Dunker   Formatting of this note might be different from the original. Overview:  Qualifier: Diagnosis of  By: Everardo All MD, Sean A   Menopausal and postmenopausal disorder 01/16/2009   Qualifier: Diagnosis of  By: Everardo All MD, Cleophas Dunker   Formatting of this note might be different from the original. Overview:  Qualifier: Diagnosis of  By: Everardo All MD, Sean A   Moderate aortic regurgitation 05/21/2019  Neuropathic pain 09/25/2020   Neuropathy    Obesity (BMI 30.0-34.9) 10/13/2021   Pain in soft tissues of limb 06/20/2012   Formatting of this note might be different from the original. Overview:  IMPRESSION: Left knee pain Formatting of this note might be different from the original. IMPRESSION: Left knee pain   Pain in thoracic spine 07/30/2013   Palpitations 01/28/2017   Primary osteoarthritis of right knee 10/06/2014   Restless legs syndrome 08/25/2010   Shortness of breath on exertion 01/28/2017   SI (sacroiliac) pain 05/10/2016   Thoracic spondylosis 11/20/2010   Overview:  Overview:  IMPRESSION: Thoracic radiculopathy Overview:  IMPRESSION: Thoracic radiculopathy  Formatting of this note might be different from the original. Overview:  IMPRESSION: Thoracic radiculopathy Formatting of this note might be different from the original. IMPRESSION: Thoracic radiculopathy   Thrombocythemia, essential (HCC) 09/12/2013   Thyroid disease    TMJ (dislocation of temporomandibular joint) 07/30/2013   Tremor 12/29/2021   Unspecified hypothyroidism 01/16/2009   Qualifier: Diagnosis of  By: Everardo All MD, Sean A    Uterine cancer (HCC) 07/30/2013   Past Surgical History:  Procedure Laterality Date   APPENDECTOMY     CERVICAL DISC SURGERY     CHOLECYSTECTOMY      FRACTURE SURGERY Left    Left arm has a metal plate   KNEE SURGERY Bilateral    TKR   ROBOTIC ASSISTED LAP VAGINAL HYSTERECTOMY     did not take ovaries. had uterine cancer.   Family History  Problem Relation Age of Onset   Deep vein thrombosis Father    Heart disease Father    Heart failure Father    Alzheimer's disease Father    Heart disease Brother    Pulmonary embolism Brother    Heart disease Brother    Social History   Socioeconomic History   Marital status: Married    Spouse name: Not on file   Number of children: Not on file   Years of education: Not on file   Highest education level: GED or equivalent  Occupational History   Not on file  Tobacco Use   Smoking status: Former   Smokeless tobacco: Never   Tobacco comments:    at age 76  Vaping Use   Vaping status: Never Used  Substance and Sexual Activity   Alcohol use: No   Drug use: No   Sexual activity: Not on file  Other Topics Concern   Not on file  Social History Narrative   Not on file   Social Determinants of Health   Financial Resource Strain: Low Risk  (03/21/2023)   Overall Financial Resource Strain (CARDIA)    Difficulty of Paying Living Expenses: Not hard at all  Food Insecurity: No Food Insecurity (03/21/2023)   Hunger Vital Sign    Worried About Running Out of Food in the Last Year: Never true    Ran Out of Food in the Last Year: Never true  Transportation Needs: No Transportation Needs (03/21/2023)   PRAPARE - Administrator, Civil Service (Medical): No    Lack of Transportation (Non-Medical): No  Physical Activity: Inactive (03/21/2023)   Exercise Vital Sign    Days of Exercise per Week: 0 days    Minutes of Exercise per Session: 0 min  Stress: No Stress Concern Present (03/21/2023)   [employer] of Occupational Health - Occupational Stress Questionnaire    Feeling of Stress : Not at all  Social Connections: Socially Integrated (03/21/2023)  Social Oncologist [NHANES]    Frequency of Communication with Friends and Family: More than three times a week    Frequency of Social Gatherings with Friends and Family: Twice a week    Attends Religious Services: More than 4 times per year    Active Member of [employer] or Organizations: Yes    Attends Engineer, structural: More than 4 times per year    Marital Status: Married    Tobacco Counseling Counseling given: Not Answered Tobacco comments: at age 33   Clinical Intake:  Pre-visit preparation completed: Yes  Pain : 0-10 Pain Score: 8  Pain Type: Acute pain Pain Location: Hand Pain Orientation: Right     BMI - recorded: 34.33 Nutritional Status: BMI > 30  Obese Nutritional Risks: None Diabetes: No  How often do you need to have someone help you when you read instructions, pamphlets, or other written materials from your doctor or pharmacy?: 1 - Never What is the last grade level you completed in school?: HSG  Interpreter Needed?: No  Information entered by :: Susie Cassette, LPN.   Activities of Daily Living    03/21/2023    2:30 PM  In your present state of health, do you have any difficulty performing the following activities:  Hearing? 0  Vision? 0  Difficulty concentrating or making decisions? 1  Comment some  Walking or climbing stairs? 0  Dressing or bathing? 0  Doing errands, shopping? 0  Preparing Food and eating ? N  Using the Toilet? N  In the past six months, have you accidently leaked urine? N  Do you have problems with loss of bowel control? N  Managing your Medications? N  Managing your Finances? N  Housekeeping or managing your Housekeeping? N    Patient Care Team: CoxFritzi Mandes, MD as PCP - General (Family Medicine) Beverely Pace, Osborn Coho, MD as Referring Physician (Cardiology) Margarette Canada., DPM (Podiatry) Kallie Edward (Inactive) as Referring Physician Kizzie Bane as Physician Assistant (Physician  Assistant)  Indicate any recent Medical Services you may have received from other than [facility] providers in the past year (date may be approximate).     Assessment:   This is a routine wellness examination for [place].  Hearing/Vision screen Hearing Screening - Comments:: Patient denied any hearing difficulty.   No hearing aids.  Vision Screening - Comments:: Patient does wear corrective lenses/contacts/readers.  Annual eye exam done by: Loistine Chance    Goals Addressed             This Visit's Progress    Client understands the importance of follow-up with providers by attending scheduled visits        Depression Screen    03/21/2023    2:29 PM 02/03/2023    1:33 PM  PHQ 2/9 Scores  PHQ - 2 Score 0 0  PHQ- 9 Score 0     Fall Risk    03/21/2023    2:29 PM 02/03/2023    1:32 PM 03/06/2018    4:47 PM  Fall Risk   Falls in the past year? 1 1 1   Comment   Emmi Telephone Survey: data to providers prior to load  Number falls in past yr: 1 1 1   Comment   Emmi Telephone Survey Actual Response = 2  Injury with Fall? 1 0 0  Risk for fall due to : History of fall(s);Impaired balance/gait;Orthopedic patient History of fall(s);No Fall Risks   Follow up Education  provided;Falls prevention discussed;Falls evaluation completed Falls evaluation completed     MEDICARE RISK AT HOME: Medicare Risk at Home Any stairs in or around the home?: Yes (basement) If so, are there any without handrails?: No Home free of loose throw rugs in walkways, pet beds, electrical cords, etc?: Yes Adequate lighting in your home to reduce risk of falls?: Yes Life alert?: No Use of a cane, walker or w/c?: Yes Grab bars in the bathroom?: Yes Shower chair or bench in shower?: Yes Elevated toilet seat or a handicapped toilet?: Yes  TIMED UP AND GO:  Was the test performed?  No    Cognitive Function:    03/21/2023    2:31 PM  MMSE - Mini Mental State Exam  Not completed: Unable to complete         03/21/2023    2:37 PM  6CIT Screen  What Year? 0 points  What month? 0 points  What time? 0 points  Count back from 20 0 points  Months in reverse 0 points  Repeat phrase 0 points  Total Score 0 points    Immunizations Immunization History  Administered Date(s) Administered   Fluad Trivalent(High Dose 65+) 02/03/2023   Influenza, High Dose Seasonal PF 04/09/2021   Moderna Sars-Covid-2 Vaccination 05/14/2019, 06/11/2019, 01/04/2020   Pfizer(Comirnaty)Fall Seasonal Vaccine 12 years and older 02/03/2023   Pneumococcal Polysaccharide-23 04/09/2021    TDAP status: Up to date  Flu Vaccine status: Up to date  Pneumococcal vaccine status: Due, Education has been provided regarding the importance of this vaccine. Advised may receive this vaccine at local pharmacy or Health Dept. Aware to provide a copy of the vaccination record if obtained from local pharmacy or Health Dept. Verbalized acceptance and understanding.  Covid-19 vaccine status: Completed vaccines  Qualifies for Shingles Vaccine? Yes   Zostavax completed No   Shingrix Completed?: No.    Education has been provided regarding the importance of this vaccine. Patient has been advised to call insurance company to determine out of pocket expense if they have not yet received this vaccine. Advised may also receive vaccine at local pharmacy or Health Dept. Verbalized acceptance and understanding.  Screening Tests Health Maintenance  Topic Date Due   Hepatitis C Screening  Never done   DTaP/Tdap/Td (1 - Tdap) Never done   Zoster Vaccines- Shingrix (1 of 2) Never done   DEXA SCAN  Never done   Pneumonia Vaccine 78+ Years old (2 of 2 - PCV) 04/09/2022   COVID-19 Vaccine (5 - 2023-24 season) 03/31/2023   Medicare Annual Wellness (AWV)  03/20/2024   INFLUENZA VACCINE  Completed   HPV VACCINES  Aged Out   Colonoscopy  Discontinued    Health Maintenance  Health Maintenance Due  Topic Date Due   Hepatitis C Screening   Never done   DTaP/Tdap/Td (1 - Tdap) Never done   Zoster Vaccines- Shingrix (1 of 2) Never done   DEXA SCAN  Never done   Pneumonia Vaccine 43+ Years old (2 of 2 - PCV) 04/09/2022    Colorectal cancer screening: No longer required.   Mammogram status: Completed 10/28/2022. Repeat every year  Bone Density status: Com[facility]; need records.  Lung Cancer Screening: (Low Dose CT Chest recommended if Age 4-80 years, 20 pack-year currently smoking OR have quit w/in 15years.) does not qualify.   Lung Cancer Screening Referral: no  Additional Screening:  Hepatitis C Screening: does qualify; Completed: no  Vision Screening: Recommended annual ophthalmology exams for  early detection of glaucoma and other disorders of the eye. Is the patient up to date with their annual eye exam?  Yes  Who is the provider or what is the name of the office in which the patient attends annual eye exams? [facility] If pt is not established with a provider, would they like to be referred to a provider to establish care? No .   Dental Screening: Recommended annual dental exams for proper oral hygiene  Diabetic Foot Exam: N/A  Community Resource Referral / Chronic Care Management: CRR required this visit?  No   CCM required this visit?  No     Plan:     I have personally reviewed and noted the following in the patient's chart:   Medical and social history Use of alcohol, tobacco or illicit drugs  Current medications and supplements including opioid prescriptions. Patient is currently taking opioid prescriptions. Information provided to patient regarding non-opioid alternatives. Patient advised to discuss non-opioid treatment plan with their provider. Functional ability and status Nutritional status Physical activity Advanced directives List of other physicians Hospitalizations, surgeries, and ER visits in previous 12 months Vitals Screenings to  include cognitive, depression, and falls Referrals and appointments  In addition, I have reviewed and discussed with patient certain preventive protocols, quality metrics, and best practice recommendations. A written personalized care plan for preventive services as well as general preventive health recommendations were provided to patient.     Mickeal Needy, LPN   [id number]/06/[id number]   After Visit Summary: (MyChart) Due to this being a telephonic visit, the after visit summary with patients personalized plan was offered to patient via MyChart   Nurse Notes: None

## 2023-03-22 ENCOUNTER — Ambulatory Visit: Payer: Medicare HMO | Admitting: Physician Assistant

## 2023-03-22 DIAGNOSIS — M79641 Pain in right hand: Secondary | ICD-10-CM | POA: Diagnosis not present

## 2023-03-28 ENCOUNTER — Other Ambulatory Visit: Payer: Self-pay | Admitting: Family Medicine

## 2023-03-28 ENCOUNTER — Other Ambulatory Visit: Payer: Self-pay

## 2023-03-28 MED ORDER — FUROSEMIDE 20 MG PO TABS: 20.0000 mg | ORAL_TABLET | Freq: Every day | ORAL | 1 refills | Status: DC

## 2023-03-28 NOTE — Telephone Encounter (Signed)
Copied from CRM [id number]. Topic: Clinical - Medication Refill >> Mar 28, 2023 11:41 AM Deaijah H wrote: Most Recent Primary Care Visit:  Provider: Mickeal Needy  Department: [facility]  Visit Type: MEDICARE AWV, INITIAL  Date: 03/21/2023  Medication: ***  Has the patient contacted their pharmacy?  (Agent: If no, request that the patient contact the pharmacy for the refill. If patient does not wish to contact the pharmacy document the reason why and proceed with request.) (Agent: If yes, when and what did the pharmacy advise?)  Is this the correct pharmacy for this prescription?  If no, delete pharmacy and type the correct one.  This is the patient's preferred pharmacy:  [facility] [address] [place] - [address] [place] [address] Phone: [phone] Fax: [phone]   Has the prescription been filled recently?   Is the patient out of the medication?   Has the patient been seen for an appointment in the last year OR does the patient have an upcoming appointment?   Can we respond through MyChart?   Agent: Please be advised that Rx refills may take up to 3 business days. We ask that you follow-up with your pharmacy.

## 2023-03-30 NOTE — Progress Notes (Unsigned)
Acute Office Visit  Subjective:    Patient ID: Lauren Roth, female    DOB: [birth year], 77 y.o.   MRN: [id number]  Chief Complaint  Patient presents with   Hand Pain    Discussed the use of AI scribe software for clinical note transcription with the patient, who gave verbal consent to proceed.  HPI: Patient is in today for right hand pain x 1.5 week. Has  only taken tylenol which does not help. Left and hurts as well. Did go to meloxicam for 5 days (rx'd by ED). Has multiple joint pain.  History of Present Illness The patient presents with bilateral hand pain, more severe on the right, which she attributes to overuse from cleaning her house. The right hand was reportedly swollen to 'a third bigger than it was supposed to be' and had 'sharp shooting pains.' The pain was so severe that she could not use her right hand for daily activities, such as brushing her teeth or holding a water bottle. The patient also reports similar, but less severe, symptoms in the left hand. She sought care at [facility], where she was prescribed a five-day course of meloxicam, which did not alleviate the pain.  The patient also reports a history of 'inflammation all over my body' that occurs two to three times a year, affecting her hands, knees, ankles, elbows, neck, and back. She has previously been treated with a steroid pack for these episodes.  In addition, the patient has a history of severe lymphedema, which has improved significantly with the use of compression socks and leg pumps. She also has a heart condition, which she did not specify, and is on a fluid restriction. She has been prescribed Lasix for swelling, but believes the dose is lower than what she has taken in the past.  Past Medical History:  Diagnosis Date   Allergic rhinitis 01/16/2009   Qualifier: Diagnosis of  By: Everardo All MD, Clayborn Heron of this note might be different from the original. Overview:  Qualifier:  Diagnosis of  By: Everardo All MD, Gregary Signs A   Arthritis    Chronic migraine w/o aura, not intractable, w/o stat migr    Chronic pain of right knee 06/12/2021   Chronic pain syndrome 07/30/2013   DEGENERATIVE DISC DISEASE, LUMBOSACRAL SPINE 01/16/2009   Qualifier: Diagnosis of  By: Everardo All MD, Sean A    Depression    DIAPHORESIS 01/16/2009   Qualifier: Diagnosis of  By: Everardo All MD, Sean A    Edema 01/16/2009   Qualifier: Diagnosis of  By: Everardo All MD, Cleophas Dunker   Formatting of this note might be different from the original. Overview:  Qualifier: Diagnosis of  By: Everardo All MD, Sean A   Essential hypertension 01/28/2017   Essential thrombocytosis (HCC) 11/25/2017   Fibromyalgia    GERD 01/16/2009   Qualifier: Diagnosis of  By: Everardo All MD, Sean A    Heart failure (HCC) 05/08/2021   Hypertension    HYPERTENSION 01/16/2009   Qualifier: Diagnosis of  By: Everardo All MD, Sean A    Hypokalemia 01/16/2009   Qualifier: Diagnosis of  By: Everardo All MD, Cleophas Dunker   Formatting of this note might be different from the original. Overview:  Qualifier: Diagnosis of  By: Everardo All MD, Sean A   Left hip pain 05/24/2019   Lumbosacral spondylosis 10/09/2010   Lymphedema 02/10/2021   Medication management 08/25/2010   Memory loss 01/16/2009   Qualifier: Diagnosis of  By: Everardo All MD, Cleophas Dunker  Formatting of this note might be different from the original. Overview:  Qualifier: Diagnosis of  By: Everardo All MD, Sean A   Menopausal and postmenopausal disorder 01/16/2009   Qualifier: Diagnosis of  By: Everardo All MD, Cleophas Dunker   Formatting of this note might be different from the original. Overview:  Qualifier: Diagnosis of  By: Everardo All MD, Sean A   Moderate aortic regurgitation 05/21/2019   Neuropathic pain 09/25/2020   Neuropathy    Obesity (BMI 30.0-34.9) 10/13/2021   Pain in soft tissues of limb 06/20/2012   Formatting of this note might be different from the original. Overview:  IMPRESSION: Left knee pain Formatting of this note might be  different from the original. IMPRESSION: Left knee pain   Pain in thoracic spine 07/30/2013   Palpitations 01/28/2017   Primary osteoarthritis of right knee 10/06/2014   Restless legs syndrome 08/25/2010   Shortness of breath on exertion 01/28/2017   SI (sacroiliac) pain 05/10/2016   Thoracic spondylosis 11/20/2010   Overview:  Overview:  IMPRESSION: Thoracic radiculopathy Overview:  IMPRESSION: Thoracic radiculopathy  Formatting of this note might be different from the original. Overview:  IMPRESSION: Thoracic radiculopathy Formatting of this note might be different from the original. IMPRESSION: Thoracic radiculopathy   Thrombocythemia, essential (HCC) 09/12/2013   Thyroid disease    TMJ (dislocation of temporomandibular joint) 07/30/2013   Tremor 12/29/2021   Unspecified hypothyroidism 01/16/2009   Qualifier: Diagnosis of  By: Everardo All MD, Sean A    Uterine cancer (HCC) 07/30/2013    Past Surgical History:  Procedure Laterality Date   APPENDECTOMY     CERVICAL DISC SURGERY     CHOLECYSTECTOMY     FRACTURE SURGERY Left    Left arm has a metal plate   KNEE SURGERY Bilateral    TKR   ROBOTIC ASSISTED LAP VAGINAL HYSTERECTOMY     did not take ovaries. had uterine cancer.    Family History  Problem Relation Age of Onset   Deep vein thrombosis Father    Heart disease Father    Heart failure Father    Alzheimer's disease Father    Heart disease Brother    Pulmonary embolism Brother    Heart disease Brother     Social History   Socioeconomic History   Marital status: Married    Spouse name: Not on file   Number of children: Not on file   Years of education: Not on file   Highest education level: GED or equivalent  Occupational History   Not on file  Tobacco Use   Smoking status: Former   Smokeless tobacco: Never   Tobacco comments:    at age 33  Vaping Use   Vaping status: Never Used  Substance and Sexual Activity   Alcohol use: No   Drug use: No   Sexual  activity: Not on file  Other Topics Concern   Not on file  Social History Narrative   Not on file   Social Drivers of Health   Financial Resource Strain: Low Risk  (03/21/2023)   Overall Financial Resource Strain (CARDIA)    Difficulty of Paying Living Expenses: Not hard at all  Food Insecurity: No Food Insecurity (03/21/2023)   Hunger Vital Sign    Worried About Running Out of Food in the Last Year: Never true    Ran Out of Food in the Last Year: Never true  Transportation Needs: No Transportation Needs (03/21/2023)   PRAPARE - Administrator, Civil Service (Medical):  No    Lack of Transportation (Non-Medical): No  Physical Activity: Inactive (03/21/2023)   Exercise Vital Sign    Days of Exercise per Week: 0 days    Minutes of Exercise per Session: 0 min  Stress: No Stress Concern Present (03/21/2023)   [employer] of Occupational Health - Occupational Stress Questionnaire    Feeling of Stress : Not at all  Social Connections: Socially Integrated (03/21/2023)   Social Connection and Isolation Panel [NHANES]    Frequency of Communication with Friends and Family: More than three times a week    Frequency of Social Gatherings with Friends and Family: Twice a week    Attends Religious Services: More than 4 times per year    Active Member of [employer] or Organizations: Yes    Attends Engineer, structural: More than 4 times per year    Marital Status: Married  Catering manager Violence: Not At Risk (03/21/2023)   Humiliation, Afraid, Rape, and Kick questionnaire    Fear of Current or Ex-Partner: No    Emotionally Abused: No    Physically Abused: No    Sexually Abused: No    Outpatient Medications Prior to Visit  Medication Sig Dispense Refill   albuterol (VENTOLIN HFA) 108 (90 Base) MCG/ACT inhaler Inhale 2 puffs into the lungs every 6 (six) hours as needed for wheezing or shortness of breath.     amoxicillin-clavulanate (AUGMENTIN) 875-125 MG tablet Take 1  tablet by mouth 2 (two) times daily. 20 tablet 0   aspirin 81 MG chewable tablet Chew 81 mg by mouth daily.     Biotin 10 MG TABS Take by mouth.     buprenorphine (SUBUTEX) 2 MG SUBL SL tablet Place 2 mg under the tongue daily.     buPROPion (WELLBUTRIN XL) 300 MG 24 hr tablet Take 1 tablet by mouth daily.     Calcium Carb-Cholecalciferol 500-10 MG-MCG TABS Take by mouth.     cloNIDine (CATAPRES) 0.1 MG tablet Take 1 tablet by mouth 2 (two) times daily.     diclofenac Sodium (VOLTAREN) 1 % GEL Apply 0.5 g topically 4 (four) times daily.     diltiazem (TIAZAC) 240 MG 24 hr capsule Take 240 mg by mouth daily.     donepezil (ARICEPT) 10 MG tablet Take 1 tablet (10 mg total) by mouth daily. 90 tablet 1   DULoxetine (CYMBALTA) 60 MG capsule Take 60 mg by mouth daily.     ergocalciferol (VITAMIN D2) 1.25 MG (50000 UT) capsule Take 50,000 Units by mouth once a week.     finasteride (PROSCAR) 5 MG tablet Take 5 mg by mouth daily.     fluticasone (FLONASE) 50 MCG/ACT nasal spray Place 1 spray into both nostrils daily as needed for allergies or rhinitis.     furosemide (LASIX) 20 MG tablet Take 1 tablet (20 mg total) by mouth daily. 30 tablet 1   iron polysaccharides (NIFEREX) 150 MG capsule Take 150 mg by mouth daily.     levothyroxine (SYNTHROID) 50 MCG tablet Take 1 tablet (50 mcg total) by mouth daily. 90 tablet 1   lidocaine (LIDODERM) 5 % Place onto the skin.     loratadine (CLARITIN) 10 MG tablet Take 10 mg by mouth daily.     losartan (COZAAR) 50 MG tablet Take 50 mg by mouth daily.     Misc Natural Products (FIBER 7 PO) Take by mouth.     montelukast (SINGULAIR) 10 MG tablet Take 10 mg by mouth at  bedtime.     nitroGLYCERIN (NITROSTAT) 0.4 MG SL tablet Place 1 tablet (0.4 mg total) under the tongue as needed for chest pain. 25 tablet 3   nystatin cream (MYCOSTATIN) Apply 1 application topically 2 (two) times daily.     omeprazole (PRILOSEC) 20 MG capsule Take 1 capsule by mouth daily.      oxybutynin (DITROPAN-XL) 10 MG 24 hr tablet Take 10 mg by mouth daily.     oxyCODONE-acetaminophen (PERCOCET) 10-325 MG tablet Take 1 tablet by mouth every 4 (four) hours as needed for pain.     pregabalin (LYRICA) 75 MG capsule Take 150 mg by mouth at bedtime.     propranolol ER (INDERAL LA) 60 MG 24 hr capsule Take 60 mg by mouth daily.     rOPINIRole (REQUIP) 1 MG tablet Take 1 tablet by mouth daily.     Suvorexant (BELSOMRA) 15 MG TABS Take 1 tab at night     thiamine (VITAMIN B1) 100 MG tablet Take by mouth.     tiZANidine (ZANAFLEX) 4 MG tablet Take 4 mg by mouth 3 (three) times daily as needed for muscle spasms.     topiramate (TOPAMAX) 25 MG tablet Take 25 mg by mouth 2 (two) times daily.     traZODone (DESYREL) 100 MG tablet Take 100 mg by mouth at bedtime.     Vitamin D, Ergocalciferol, (DRISDOL) 1.25 MG (50000 UNIT) CAPS capsule Take 50,000 Units by mouth once a week.     No facility-administered medications prior to visit.    Allergies  Allergen Reactions   Cottonseed Oil Hives and Itching    Hives, Itching Raw Cotton    Codeine Sulfate     Review of Systems  Constitutional:  Negative for chills, fatigue and fever.  HENT:  Negative for congestion, ear pain, rhinorrhea and sore throat.   Respiratory:  Negative for cough and shortness of breath.   Cardiovascular:  Negative for chest pain.  Gastrointestinal:  Negative for abdominal pain, constipation, diarrhea, nausea and vomiting.  Genitourinary:  Negative for dysuria and urgency.  Musculoskeletal:  Negative for back pain and myalgias.       Right hand pain  Neurological:  Negative for dizziness, weakness, light-headedness and headaches.  Psychiatric/Behavioral:  Negative for dysphoric mood. The patient is not nervous/anxious.        Objective:        03/31/2023    8:28 AM 03/21/2023    2:25 PM 02/07/2023    8:42 AM  Vitals with BMI  Height 5\' 4"  5\' 4"  5\' 4"   Weight 211 lbs 200 lbs 210 lbs 3 oz  BMI 36.2  34.31 36.06  Systolic 158  150  Diastolic 72  62  Pulse 76  70    No data found.    Physical Exam Vitals reviewed.  Constitutional:      Appearance: Normal appearance. She is normal weight.  Musculoskeletal:     Right lower leg: Edema present.     Left lower leg: Edema present.     Comments: RIGHT WRIST INCREASED WARMTH. TENDER. NO ERYTHEMA. NO EDEMA.  LEFT WRIST NORMAL.   Neurological:     Mental Status: She is alert and oriented to person, place, and time.  Psychiatric:        Mood and Affect: Mood normal.        Behavior: Behavior normal.     Health Maintenance Due  Topic Date Due   Hepatitis C Screening  Never done  DTaP/Tdap/Td (1 - Tdap) Never done   Zoster Vaccines- Shingrix (1 of 2) Never done   DEXA SCAN  Never done   Pneumonia Vaccine 62+ Years old (2 of 2 - PCV) 04/09/2022   COVID-19 Vaccine (5 - 2024-25 season) 03/31/2023    There are no preventive care reminders to display for this patient.   Lab Results  Component Value Date   TSH 1.870 02/03/2023   Lab Results  Component Value Date   WBC 8.8 02/03/2023   HGB 13.6 02/03/2023   HCT 40.9 02/03/2023   MCV 94 02/03/2023   PLT 445 02/03/2023   Lab Results  Component Value Date   NA 138 02/07/2023   K 4.5 02/07/2023   CO2 27 02/07/2023   GLUCOSE 81 02/07/2023   BUN 12 02/07/2023   CREATININE 0.89 02/07/2023   BILITOT 0.2 02/07/2023   ALKPHOS 83 02/07/2023   AST 18 02/07/2023   ALT 22 02/07/2023   PROT 6.9 02/07/2023   ALBUMIN 4.0 02/07/2023   CALCIUM 9.4 02/07/2023   EGFR 68 02/07/2023   Lab Results  Component Value Date   CHOL 209 (H) 02/03/2023   Lab Results  Component Value Date   HDL 44 02/03/2023   Lab Results  Component Value Date   LDLCALC 137 (H) 02/03/2023   Lab Results  Component Value Date   TRIG 155 (H) 02/03/2023   Lab Results  Component Value Date   CHOLHDL 4.8 (H) 02/03/2023   Lab Results  Component Value Date   HGBA1C 5.5 02/03/2023       Assessment  & Plan:  Right hand pain Assessment & Plan: Severe pain and swelling in both hands, worse on the right, with sharp shooting pains and difficulty with daily activities. Suspected gout due to the acute onset, severe pain, and swelling. -Order blood work including CBC and uric acid levels. -Prescribe colchicine, two tablets initially then one tablet daily for six days. -Consider steroid injection in the wrist to alleviate pain.  Orders: -     CBC with Differential/Platelet -     Uric acid -     Triamcinolone Acetonide  Pedal edema Assessment & Plan: Patient reports significant improvement with compression socks and leg pumps. -Encourage continued use of compression socks and leg pumps.  Orders: -     Triamcinolone Acetonide  Chronic heart failure with preserved ejection fraction (HCC) Assessment & Plan: Patient reports swelling and has been prescribed Lasix. -Advise patient to monitor weight daily and take an extra Lasix if weight increases by 3 pounds in a day. -Continue Lasix as prescribed.   Other orders -     Colchicine; 2 DAILY FIRST DAY, THEN ONE DAILY FOR ONE WEEK.  Dispense: 8 tablet; Refill: 1     Assessment & Plan Hand Pain Severe pain and swelling in both hands, worse on the right, with sharp shooting pains and difficulty with daily activities. Suspected gout due to the acute onset, severe pain, and swelling. -Order blood work including CBC and uric acid levels. -Prescribe colchicine, two tablets initially then one tablet daily for six days. -Consider steroid injection in the wrist to alleviate pain.  Congestive Heart Failure Patient reports swelling and has been prescribed Lasix. -Advise patient to monitor weight daily and take an extra Lasix if weight increases by 3 pounds in a day. -Continue Lasix as prescribed.  Lymphedema Patient reports significant improvement with compression socks and leg pumps. -Encourage continued use of compression socks and leg  pumps.  Follow-up Reevaluate in 1-2 weeks to assess response to gout treatment and overall health status. Meds ordered this encounter  Medications   colchicine 0.6 MG tablet    Sig: 2 DAILY FIRST DAY, THEN ONE DAILY FOR ONE WEEK.    Dispense:  8 tablet    Refill:  1   triamcinolone acetonide (KENALOG-40) injection 80 mg    Orders Placed This Encounter  Procedures   CBC with Differential/Platelet   Uric acid     Follow-up: No follow-ups on file.  An After Visit Summary was printed and given to the patient.  Blane Ohara, MD [facility] [phone]

## 2023-03-31 ENCOUNTER — Ambulatory Visit: Payer: Medicare HMO | Admitting: Family Medicine

## 2023-03-31 ENCOUNTER — Ambulatory Visit (INDEPENDENT_AMBULATORY_CARE_PROVIDER_SITE_OTHER): Payer: Medicare HMO | Admitting: Family Medicine

## 2023-03-31 VITALS — BP 158/72 | HR 76 | Temp 97.5°F | Ht 64.0 in | Wt 211.0 lb

## 2023-03-31 DIAGNOSIS — G894 Chronic pain syndrome: Secondary | ICD-10-CM | POA: Diagnosis not present

## 2023-03-31 DIAGNOSIS — M47817 Spondylosis without myelopathy or radiculopathy, lumbosacral region: Secondary | ICD-10-CM | POA: Diagnosis not present

## 2023-03-31 DIAGNOSIS — R600 Localized edema: Secondary | ICD-10-CM

## 2023-03-31 DIAGNOSIS — Z79899 Other long term (current) drug therapy: Secondary | ICD-10-CM | POA: Diagnosis not present

## 2023-03-31 DIAGNOSIS — M79641 Pain in right hand: Secondary | ICD-10-CM | POA: Diagnosis not present

## 2023-03-31 DIAGNOSIS — M533 Sacrococcygeal disorders, not elsewhere classified: Secondary | ICD-10-CM | POA: Diagnosis not present

## 2023-03-31 DIAGNOSIS — I5032 Chronic diastolic (congestive) heart failure: Secondary | ICD-10-CM | POA: Diagnosis not present

## 2023-03-31 DIAGNOSIS — M961 Postlaminectomy syndrome, not elsewhere classified: Secondary | ICD-10-CM | POA: Diagnosis not present

## 2023-03-31 MED ORDER — TRIAMCINOLONE ACETONIDE 40 MG/ML IJ SUSP
80.0000 mg | Freq: Once | INTRAMUSCULAR | Status: AC
  Administered 2023-03-31: 80 mg via INTRAMUSCULAR

## 2023-03-31 MED ORDER — COLCHICINE 0.6 MG PO TABS: ORAL_TABLET | ORAL | 1 refills | Status: DC

## 2023-03-31 NOTE — Patient Instructions (Signed)
VISIT SUMMARY:  During today's visit, we discussed your bilateral hand pain, which is more severe on the right side, and your history of inflammation and lymphedema. We also reviewed your heart condition and current medications.  YOUR PLAN:  -HAND PAIN: You have severe pain and swelling in both hands, especially on the right side, which may be due to gout. Gout is a type of arthritis caused by a buildup of uric acid crystals in the joints. We will do blood work to check your uric acid levels and have prescribed colchicine to help reduce the pain and swelling. If needed, we may consider a steroid injection in your wrist.  -CONGESTIVE HEART FAILURE: Congestive heart failure is a condition where the heart does not pump blood as well as it should, leading to fluid buildup. Continue taking Lasix as prescribed and monitor your weight daily. If you notice a weight gain of 3 pounds in one day, take an extra dose of Lasix.  -LYMPHEDEMA: Lymphedema is swelling that generally occurs in one of your arms or legs, often due to lymph node removal or damage. You have seen significant improvement with the use of compression socks and leg pumps. Continue using these as they are helping manage your symptoms.  INSTRUCTIONS:  KENALOG SHOT GIVEN TODAY.  PRESCRIPTION FOR COLCHICINE SENT.  CALL BACK IF NOT RESOLVED IN 1 WEEK.

## 2023-04-03 DIAGNOSIS — M79641 Pain in right hand: Secondary | ICD-10-CM | POA: Insufficient documentation

## 2023-04-03 DIAGNOSIS — R600 Localized edema: Secondary | ICD-10-CM | POA: Insufficient documentation

## 2023-04-03 NOTE — Assessment & Plan Note (Signed)
Patient reports swelling and has been prescribed Lasix. -Advise patient to monitor weight daily and take an extra Lasix if weight increases by 3 pounds in a day. -Continue Lasix as prescribed.

## 2023-04-03 NOTE — Assessment & Plan Note (Signed)
Patient reports significant improvement with compression socks and leg pumps. -Encourage continued use of compression socks and leg pumps.

## 2023-04-03 NOTE — Assessment & Plan Note (Signed)
Severe pain and swelling in both hands, worse on the right, with sharp shooting pains and difficulty with daily activities. Suspected gout due to the acute onset, severe pain, and swelling. -Order blood work including CBC and uric acid levels. -Prescribe colchicine, two tablets initially then one tablet daily for six days. -Consider steroid injection in the wrist to alleviate pain.

## 2023-04-06 ENCOUNTER — Encounter: Payer: Self-pay | Admitting: Family Medicine

## 2023-04-12 ENCOUNTER — Encounter: Payer: Self-pay | Admitting: Family Medicine

## 2023-04-14 ENCOUNTER — Other Ambulatory Visit: Payer: Self-pay | Admitting: Family Medicine

## 2023-04-14 ENCOUNTER — Encounter: Payer: Self-pay | Admitting: Family Medicine

## 2023-04-14 ENCOUNTER — Telehealth: Payer: Self-pay

## 2023-04-14 MED ORDER — FUROSEMIDE 20 MG PO TABS: 20.0000 mg | ORAL_TABLET | Freq: Two times a day (BID) | ORAL | 1 refills | Status: DC

## 2023-04-14 NOTE — Telephone Encounter (Signed)
Patient stated she has been taking lasix twice daily, and has ran out and will need a new rx sent for twice a day.

## 2023-04-14 NOTE — Telephone Encounter (Signed)
Copied from CRM [id number]. Topic: Clinical - Medication Refill >> Apr 11, 2023 11:53 AM Hector Shade B wrote: Most Recent Primary Care Visit:  Provider: [facility]  Department: [facility]  Visit Type: OFFICE VISIT  Date: 03/31/2023  Medication:  furosemide (LASIX) 20 MG tablet   Has the patient contacted their pharmacy? Yes (Agent: If no, request that the patient contact the pharmacy for the refill. If patient does not wish to contact the pharmacy document the reason why and proceed with request.) (Agent: If yes, when and what did the pharmacy advise?)  Is this the correct pharmacy for this prescription? Yes If no, delete pharmacy and type the correct one.  This is the patient's preferred pharmacy:  [facility] [address] [place] - [address] [place] [address] Phone: [phone] Fax: [phone]   Has the prescription been filled recently? Yes  Is the patient out of the medication? Yes  Has the patient been seen for an appointment in the last year OR does the patient have an upcoming appointment? Yes  Can we respond through MyChart? Yes  Agent: Please be advised that Rx refills may take up to 3 business days. We ask that you follow-up with your pharmacy.

## 2023-04-21 DIAGNOSIS — M47816 Spondylosis without myelopathy or radiculopathy, lumbar region: Secondary | ICD-10-CM | POA: Insufficient documentation

## 2023-04-21 DIAGNOSIS — M533 Sacrococcygeal disorders, not elsewhere classified: Secondary | ICD-10-CM | POA: Diagnosis not present

## 2023-04-28 ENCOUNTER — Other Ambulatory Visit: Payer: Self-pay

## 2023-05-17 DIAGNOSIS — M47816 Spondylosis without myelopathy or radiculopathy, lumbar region: Secondary | ICD-10-CM | POA: Diagnosis not present

## 2023-05-18 ENCOUNTER — Other Ambulatory Visit: Payer: Self-pay

## 2023-05-18 MED ORDER — FLUTICASONE PROPIONATE 50 MCG/ACT NA SUSP: 1.0000 | Freq: Every day | NASAL | 1 refills | Status: AC | PRN

## 2023-05-20 DIAGNOSIS — R0789 Other chest pain: Secondary | ICD-10-CM | POA: Diagnosis not present

## 2023-05-20 DIAGNOSIS — M25551 Pain in right hip: Secondary | ICD-10-CM | POA: Diagnosis not present

## 2023-05-20 DIAGNOSIS — M545 Low back pain, unspecified: Secondary | ICD-10-CM | POA: Diagnosis not present

## 2023-05-20 DIAGNOSIS — Z043 Encounter for examination and observation following other accident: Secondary | ICD-10-CM | POA: Diagnosis not present

## 2023-05-20 DIAGNOSIS — W19XXXA Unspecified fall, initial encounter: Secondary | ICD-10-CM | POA: Diagnosis not present

## 2023-05-20 DIAGNOSIS — S0990XA Unspecified injury of head, initial encounter: Secondary | ICD-10-CM | POA: Diagnosis not present

## 2023-05-24 ENCOUNTER — Ambulatory Visit (INDEPENDENT_AMBULATORY_CARE_PROVIDER_SITE_OTHER)
Admission: RE | Admit: 2023-05-24 | Discharge: 2023-05-24 | Disposition: A | Discharge status: Home / Self Care | Payer: HMO | Source: Ambulatory Visit

## 2023-05-24 ENCOUNTER — Ambulatory Visit (INDEPENDENT_AMBULATORY_CARE_PROVIDER_SITE_OTHER): Payer: Self-pay

## 2023-05-24 VITALS — BP 124/62 | HR 74 | Temp 97.4°F | Ht 64.0 in | Wt 214.0 lb

## 2023-05-24 DIAGNOSIS — E875 Hyperkalemia: Secondary | ICD-10-CM

## 2023-05-24 DIAGNOSIS — R109 Unspecified abdominal pain: Secondary | ICD-10-CM

## 2023-05-24 DIAGNOSIS — S20212S Contusion of left front wall of thorax, sequela: Secondary | ICD-10-CM

## 2023-05-24 DIAGNOSIS — S2232XA Fracture of one rib, left side, initial encounter for closed fracture: Secondary | ICD-10-CM | POA: Diagnosis not present

## 2023-05-24 NOTE — Patient Instructions (Signed)
 VISIT SUMMARY:  Today, we discussed your recent falls and the severe pain you are experiencing in your ribs and lower back. We reviewed your current pain management regimen and your history of chronic pain, fibromyalgia, arthritis, neuropathy, sciatica, and lymphedema. We also discussed the results of your recent emergency room visit and the imaging studies performed there.  YOUR PLAN:  -ACUTE LEFT FLANK AND RIB PAIN: You have severe pain in your left flank and rib area after your recent falls. This pain is likely due to a soft tissue injury or a non-displaced rib fracture. We will order a chest x-ray to check for rib fractures and a blood test to check for internal bleeding. In the meantime, continue applying ice to the area (20 minutes on, 20 minutes off) and follow your current pain management regimen. If your symptoms become severe, please visit the emergency room immediately.  -CHRONIC PAIN MANAGEMENT: Your chronic pain, which includes conditions like fibromyalgia, arthritis, neuropathy, and sciatica, is being managed with medications and treatments such as injections and ablations. Continue with your current medications and follow up with the pain clinic for ongoing treatment.  -LYMPHEDEMA: Lymphedema is swelling in your legs, particularly the left one, which you manage with lymphedema pumps. Continue using the pumps and follow up with the lymphedema clinic as planned.  -GENERAL HEALTH MAINTENANCE: Your previous blood work showed high potassium levels and borderline diabetes. We will order blood tests to monitor your potassium levels and blood sugar. Regular monitoring of your blood counts is also important.  INSTRUCTIONS:  Please follow up with us  once you have the results of your chest x-ray and blood tests. Continue your regular appointments with the pain clinic and lymphedema clinic.

## 2023-05-24 NOTE — Progress Notes (Signed)
 Subjective:  Patient ID: Lauren Roth, female    DOB: Sep 04, 1945  Age: 78 y.o. MRN: [id number]  Chief Complaint  Patient presents with   Hospitalization Follow-up    HPI    Patient is here today for hospitalization follow up. Patient fall twice last week. Patient states she can go down her steps but cannot go back up them without help. Patient states her hip, ribs, back are very painful. She states she had 6 injections the day before her falls. Patient states every time she moves she just wants to scream. Patient states she has taken her oxycodone  and muscle relaxer before coming here today.   Lauren Roth is a 78 year old female with chronic pain and multiple falls who presents with severe pain in the ribs and lower back after recent falls. She is accompanied by her husband, Lauren Roth.  She experienced two falls recently. The first occurred on May 17, 2023, in her kitchen where she fell backwards onto a cement floor, hitting her head and lower back. She did not lose consciousness and managed to crawl to a counter to pull herself up, not seeking immediate medical attention. The second fall occurred on May 19, 2023, when she was standing on a couch to remove curtains and lost her balance, falling backwards and landing on her lower back. Her husband assisted her after this fall. She experienced severe pain in her back and ribs, prompting a visit to the emergency room the following day.  In the emergency room, CT scans of her head, neck, and lower back, as well as x-rays, were performed. A CT scan of her chest was done to evaluate her ribs, but no fractures were identified. Despite this, she reports severe pain in her ribs, describing it as 'excruciating' and radiating around her rib cage to her abdomen, feeling like 'a hot poker inside my gut.' She is unable to lean back or lie in bed due to the pain.  She has a history of chronic pain management and is currently on  multiple medications, including buprenorphine  (Subutex ) at night, Voltaren gel, Cymbalta  60 mg daily, Percocet 10 mg/325 mg four times a day, Lyrica  150 mg daily, and Zanaflex  4 mg three times daily. She has been receiving a series of injections in her lower back for pain management, with plans for future ablations.  Her past medical history includes fibromyalgia, arthritis, neuropathy in both legs and feet, sciatica, and lymphedema. She reports significant swelling in her legs, particularly the left, and uses lymphedema pumps at home. She has been on disability since her early forties due to upper spine issues. No loss of consciousness during falls, no head injury in the second fall, and reports swelling in legs, particularly the left leg.      03/21/2023    2:29 PM 02/03/2023    1:33 PM  Depression screen PHQ 2/9  Decreased Interest 0 0  Down, Depressed, Hopeless 0 0  PHQ - 2 Score 0 0  Altered sleeping 0   Tired, decreased energy 0   Change in appetite 0   Feeling bad or failure about yourself  0   Trouble concentrating 0   Moving slowly or fidgety/restless 0   Suicidal thoughts 0   PHQ-9 Score 0   Difficult doing work/chores Not difficult at all         05/24/2023    1:47 PM  Fall Risk   Falls in the past year? 1  Number falls  in past yr: 1  Injury with Fall? 1  Risk for fall due to : History of fall(s)    Patient Care Team: Sherre Clapper, MD as PCP - General (Family Medicine) Launie, Alexa Shad, MD as Referring Physician (Cardiology) Myrick Elspeth PARAS., DPM (Podiatry) Silva Elveria BIRCH (Inactive) as Referring Physician Powell Slater RIGGERS as Physician Assistant (Physician Assistant)   Review of Systems  Constitutional:  Positive for activity change. Negative for chills, fatigue and fever.  HENT:  Negative for congestion, ear pain and sore throat.   Respiratory: Negative.  Negative for cough and shortness of breath.   Cardiovascular:  Negative for chest pain.   Gastrointestinal:  Negative for abdominal pain, constipation, diarrhea, nausea and vomiting.  Genitourinary:  Negative for dysuria and frequency.  Musculoskeletal:  Positive for back pain and myalgias. Negative for arthralgias.  Skin:  Positive for color change.  Neurological:  Negative for dizziness and headaches.  Psychiatric/Behavioral:  Negative for dysphoric mood. The patient is not nervous/anxious.     Current Outpatient Medications on File Prior to Visit  Medication Sig Dispense Refill   albuterol  (VENTOLIN  HFA) 108 (90 Base) MCG/ACT inhaler Inhale 2 puffs into the lungs every 6 (six) hours as needed for wheezing or shortness of breath.     aspirin  81 MG chewable tablet Chew 81 mg by mouth daily.     Biotin 10 MG TABS Take by mouth.     buprenorphine  (SUBUTEX ) 2 MG SUBL SL tablet Place 2 mg under the tongue daily.     buPROPion  (WELLBUTRIN  XL) 300 MG 24 hr tablet Take 1 tablet by mouth daily.     Calcium Carb-Cholecalciferol 500-10 MG-MCG TABS Take by mouth.     cloNIDine  (CATAPRES ) 0.1 MG tablet Take 1 tablet by mouth 2 (two) times daily.     colchicine  0.6 MG tablet 2 DAILY FIRST DAY, THEN ONE DAILY FOR ONE WEEK. 8 tablet 1   diclofenac Sodium (VOLTAREN) 1 % GEL Apply 0.5 g topically 4 (four) times daily.     diltiazem  (TIAZAC ) 240 MG 24 hr capsule Take 240 mg by mouth daily.     donepezil  (ARICEPT ) 10 MG tablet Take 1 tablet (10 mg total) by mouth daily. 90 tablet 1   DULoxetine  (CYMBALTA ) 60 MG capsule Take 60 mg by mouth daily.     ergocalciferol (VITAMIN D2) 1.25 MG (50000 UT) capsule Take 50,000 Units by mouth once a week.     finasteride  (PROSCAR ) 5 MG tablet Take 5 mg by mouth daily.     fluticasone  (FLONASE ) 50 MCG/ACT nasal spray Place 1 spray into both nostrils daily as needed for allergies or rhinitis. 15.8 mL 1   furosemide  (LASIX ) 20 MG tablet Take 1 tablet (20 mg total) by mouth 2 (two) times daily. 180 tablet 1   iron  polysaccharides (NIFEREX) 150 MG capsule Take  150 mg by mouth daily.     levothyroxine  (SYNTHROID ) 50 MCG tablet Take 1 tablet (50 mcg total) by mouth daily. 90 tablet 1   lidocaine (LIDODERM) 5 % Place onto the skin.     loratadine (CLARITIN) 10 MG tablet Take 10 mg by mouth daily.     losartan  (COZAAR ) 50 MG tablet Take 50 mg by mouth daily.     Misc Natural Products (FIBER 7 PO) Take by mouth.     montelukast (SINGULAIR) 10 MG tablet Take 10 mg by mouth at bedtime.     nitroGLYCERIN  (NITROSTAT ) 0.4 MG SL tablet Place 1 tablet (0.4 mg total)  under the tongue as needed for chest pain. 25 tablet 3   nystatin  cream (MYCOSTATIN ) Apply 1 application topically 2 (two) times daily.     omeprazole  (PRILOSEC) 20 MG capsule Take 1 capsule by mouth daily.     oxybutynin (DITROPAN-XL) 10 MG 24 hr tablet Take 10 mg by mouth daily.     oxyCODONE -acetaminophen  (PERCOCET) 10-325 MG tablet Take 1 tablet by mouth every 4 (four) hours as needed for pain.     pregabalin  (LYRICA ) 75 MG capsule Take 150 mg by mouth at bedtime.     propranolol ER (INDERAL LA) 60 MG 24 hr capsule Take 60 mg by mouth daily.     rOPINIRole  (REQUIP ) 1 MG tablet Take 1 tablet by mouth daily.     Suvorexant (BELSOMRA) 15 MG TABS Take 1 tab at night     thiamine (VITAMIN B1) 100 MG tablet Take by mouth.     tiZANidine  (ZANAFLEX ) 4 MG tablet Take 4 mg by mouth 3 (three) times daily as needed for muscle spasms.     topiramate  (TOPAMAX ) 25 MG tablet Take 25 mg by mouth 2 (two) times daily.     traZODone  (DESYREL ) 100 MG tablet Take 100 mg by mouth at bedtime.     Vitamin D, Ergocalciferol, (DRISDOL) 1.25 MG (50000 UNIT) CAPS capsule Take 50,000 Units by mouth once a week.     No current facility-administered medications on file prior to visit.   Past Medical History:  Diagnosis Date   Allergic rhinitis 01/16/2009   Qualifier: Diagnosis of  By: Kassie MD, Alyce DELENA Deal of this note might be different from the original. Overview:  Qualifier: Diagnosis of  By: Kassie MD, Alyce  A   Arthritis    Chronic migraine w/o aura, not intractable, w/o stat migr    Chronic pain of right knee 06/12/2021   Chronic pain syndrome 07/30/2013   DEGENERATIVE DISC DISEASE, LUMBOSACRAL SPINE 01/16/2009   Qualifier: Diagnosis of  By: Kassie MD, Sean A    Depression    DIAPHORESIS 01/16/2009   Qualifier: Diagnosis of  By: Kassie MD, Sean A    Edema 01/16/2009   Qualifier: Diagnosis of  By: Kassie MD, Alyce DELENA   Formatting of this note might be different from the original. Overview:  Qualifier: Diagnosis of  By: Kassie MD, Sean A   Essential hypertension 01/28/2017   Essential thrombocytosis (HCC) 11/25/2017   Fibromyalgia    GERD 01/16/2009   Qualifier: Diagnosis of  By: Kassie MD, Sean A    Heart failure (HCC) 05/08/2021   Hypertension    HYPERTENSION 01/16/2009   Qualifier: Diagnosis of  By: Kassie MD, Sean A    Hypokalemia 01/16/2009   Qualifier: Diagnosis of  By: Kassie MD, Alyce DELENA   Formatting of this note might be different from the original. Overview:  Qualifier: Diagnosis of  By: Kassie MD, Sean A   Left hip pain 05/24/2019   Lumbosacral spondylosis 10/09/2010   Lymphedema 02/10/2021   Medication management 08/25/2010   Memory loss 01/16/2009   Qualifier: Diagnosis of  By: Kassie MD, Alyce DELENA   Formatting of this note might be different from the original. Overview:  Qualifier: Diagnosis of  By: Kassie MD, Sean A   Menopausal and postmenopausal disorder 01/16/2009   Qualifier: Diagnosis of  By: Kassie MD, Alyce DELENA   Formatting of this note might be different from the original. Overview:  Qualifier: Diagnosis of  By: Kassie MD, Alyce DELENA  Moderate aortic regurgitation 05/21/2019   Neuropathic pain 09/25/2020   Neuropathy    Obesity (BMI 30.0-34.9) 10/13/2021   Pain in soft tissues of limb 06/20/2012   Formatting of this note might be different from the original. Overview:  IMPRESSION: Left knee pain Formatting of this note might be different from the original.  IMPRESSION: Left knee pain   Pain in thoracic spine 07/30/2013   Palpitations 01/28/2017   Primary osteoarthritis of right knee 10/06/2014   Restless legs syndrome 08/25/2010   Shortness of breath on exertion 01/28/2017   SI (sacroiliac) pain 05/10/2016   Thoracic spondylosis 11/20/2010   Overview:  Overview:  IMPRESSION: Thoracic radiculopathy Overview:  IMPRESSION: Thoracic radiculopathy  Formatting of this note might be different from the original. Overview:  IMPRESSION: Thoracic radiculopathy Formatting of this note might be different from the original. IMPRESSION: Thoracic radiculopathy   Thrombocythemia, essential (HCC) 09/12/2013   Thyroid  disease    TMJ (dislocation of temporomandibular joint) 07/30/2013   Tremor 12/29/2021   Unspecified hypothyroidism 01/16/2009   Qualifier: Diagnosis of  By: Kassie MD, Sean A    Uterine cancer (HCC) 07/30/2013   Past Surgical History:  Procedure Laterality Date   APPENDECTOMY     CERVICAL DISC SURGERY     CHOLECYSTECTOMY     FRACTURE SURGERY Left    Left arm has a metal plate   KNEE SURGERY Bilateral    TKR   ROBOTIC ASSISTED LAP VAGINAL HYSTERECTOMY     did not take ovaries. had uterine cancer.    Family History  Problem Relation Age of Onset   Deep vein thrombosis Father    Heart disease Father    Heart failure Father    Alzheimer's disease Father    Heart disease Brother    Pulmonary embolism Brother    Heart disease Brother    Social History   Socioeconomic History   Marital status: Married    Spouse name: Not on file   Number of children: Not on file   Years of education: Not on file   Highest education level: GED or equivalent  Occupational History   Not on file  Tobacco Use   Smoking status: Former   Smokeless tobacco: Never   Tobacco comments:    at age 53  Vaping Use   Vaping status: Never Used  Substance and Sexual Activity   Alcohol use: No   Drug use: No   Sexual activity: Not on file  Other Topics  Concern   Not on file  Social History Narrative   Not on file   Social Drivers of Health   Financial Resource Strain: Low Risk  (03/21/2023)   Overall Financial Resource Strain (CARDIA)    Difficulty of Paying Living Expenses: Not hard at all  Food Insecurity: No Food Insecurity (03/21/2023)   Hunger Vital Sign    Worried About Running Out of Food in the Last Year: Never true    Ran Out of Food in the Last Year: Never true  Transportation Needs: No Transportation Needs (03/21/2023)   PRAPARE - Administrator, Civil Service (Medical): No    Lack of Transportation (Non-Medical): No  Physical Activity: Inactive (03/21/2023)   Exercise Vital Sign    Days of Exercise per Week: 0 days    Minutes of Exercise per Session: 0 min  Stress: No Stress Concern Present (03/21/2023)   [employer] of Occupational Health - Occupational Stress Questionnaire    Feeling of Stress : Not at  all  Social Connections: Socially Integrated (03/21/2023)   Social Connection and Isolation Panel [NHANES]    Frequency of Communication with Friends and Family: More than three times a week    Frequency of Social Gatherings with Friends and Family: Twice a week    Attends Religious Services: More than 4 times per year    Active Member of [employer] or Organizations: Yes    Attends Engineer, Structural: More than 4 times per year    Marital Status: Married    Objective:  BP 124/62 (BP Location: Left Arm, Patient Position: Sitting)   Pulse 74   Temp (!) 97.4 F (36.3 C)   Ht 5' 4 (1.626 m)   Wt 214 lb (97.1 kg)   SpO2 97%   BMI 36.73 kg/m      05/24/2023    2:28 PM 05/24/2023    1:37 PM 03/31/2023    8:28 AM  BP/Weight  Systolic BP 124 142 158  Diastolic BP 62 80 72  Wt. (Lbs)  214 211  BMI  36.73 kg/m2 36.22 kg/m2    Physical Exam Vitals and nursing note reviewed.  Constitutional:      General: She is in acute distress.  HENT:     Head: Normocephalic and atraumatic.   Cardiovascular:     Rate and Rhythm: Normal rate and regular rhythm.  Pulmonary:     Effort: Pulmonary effort is normal.     Breath sounds: Normal breath sounds.  Chest:     Chest wall: Tenderness (significant chest tenderness left posterior chest) present.  Musculoskeletal:        General: Tenderness (low back, left flank) present.     Comments: Gross bilateral lymphedema noted  Neurological:     General: No focal deficit present.     Mental Status: She is alert.  Psychiatric:        Mood and Affect: Mood normal.      Diabetic Foot Exam - Simple   No data filed      Lab Results  Component Value Date   WBC 9.6 05/24/2023   HGB 12.7 05/24/2023   HCT 39.1 05/24/2023   PLT 354 05/24/2023   GLUCOSE 99 05/24/2023   CHOL 209 (H) 02/03/2023   TRIG 155 (H) 02/03/2023   HDL 44 02/03/2023   LDLCALC 137 (H) 02/03/2023   ALT 25 05/24/2023   AST 22 05/24/2023   NA 139 05/24/2023   K 4.6 05/24/2023   CL 96 05/24/2023   CREATININE 0.89 05/24/2023   BUN 10 05/24/2023   CO2 28 05/24/2023   TSH 1.870 02/03/2023   HGBA1C 5.5 02/03/2023      Assessment & Plan:    Left flank pain Assessment & Plan: Severe pain in the left flank and rib area following falls on January 28 and May 19, 2023. Pain exacerbated by movement and deep breaths.   Physical exam shows significant bruising and tenderness in the left lumbar paraspinal region.   CT scans and x-rays from the ER did not REPORT fractures.   Differential diagnosis includes rib fracture, soft tissue injury, and potential internal injury.   Conservative treatment for non-displaced rib fractures; urgent intervention if displaced. - Order chest x-ray to evaluate for rib fractures AS I HIGHLY SUSPECT RIB FRACTURE DUE TO SEVERE POINT TENDERNESS IN THE POSTERIOR ASPECT OF LEFT RIB CAGE AND A PALPABLE/ AUDIBLE POP IN THE LEFT LOWER RIBS - Order CBC to check for internal bleeding - Advise ice application (20  minutes on, 20  minutes off) - Continue current pain management regimen - Advise ER visit if severe symptoms develop  Orders: -     DG Ribs Unilateral Left; Future -     CBC -     Comprehensive metabolic panel  Bruised rib, left, sequela Assessment & Plan: Chronic pain managed with buprenorphine , Voltaren gel, Cymbalta , Percocet, Lyrica , and Zanaflex . Undergoing injections and ablations. Recent falls exacerbated chronic pain, especially in the lower back and ribs. Continue current regimen and follow-up with pain clinic. - Continue current pain management medications - Follow up with pain clinic for ongoing treatment and ablations   Orders: -     DG Ribs Unilateral Left; Future -     CBC -     Comprehensive metabolic panel  Hyperkalemia Assessment & Plan: Reports recent episodes of hyperkalemia noted on blood work Will order labs to check on this.,    Lymphedema Significant leg swelling managed with lymphedema pumps and clinic visits, improving mobility. - Continue using lymphedema pumps - Continue follow-up at the lymphedema clinic  General Health Maintenance High potassium and borderline diabetes. Previous blood work from October showed normal blood counts and sugar levels. Importance of regular monitoring discussed. - Order blood tests to check potassium levels and blood sugar - Monitor blood counts  Follow-up - Follow up with results of x-ray and blood tests - Continue regular appointments with pain clinic and lymphedema clinic.  Orders: -     Comprehensive metabolic panel     No orders of the defined types were placed in this encounter.   Orders Placed This Encounter  Procedures   DG Ribs Unilateral Left   CBC   Comprehensive metabolic panel     Follow-up: Return if symptoms worsen or fail to improve.  An After Visit Summary was printed and given to the patient.  Aily Tzeng, MD [facility] [phone]

## 2023-05-25 DIAGNOSIS — R109 Unspecified abdominal pain: Secondary | ICD-10-CM | POA: Insufficient documentation

## 2023-05-25 DIAGNOSIS — S20212S Contusion of left front wall of thorax, sequela: Secondary | ICD-10-CM | POA: Insufficient documentation

## 2023-05-25 DIAGNOSIS — E875 Hyperkalemia: Secondary | ICD-10-CM | POA: Insufficient documentation

## 2023-05-25 LAB — COMPREHENSIVE METABOLIC PANEL
ALT: 25 [IU]/L (ref 0–32)
AST: 22 [IU]/L (ref 0–40)
Albumin: 4.1 g/dL (ref 3.8–4.8)
Alkaline Phosphatase: 91 [IU]/L (ref 44–121)
BUN/Creatinine Ratio: 11 — ABNORMAL LOW (ref 12–28)
BUN: 10 mg/dL (ref 8–27)
Bilirubin Total: 0.3 mg/dL (ref 0.0–1.2)
CO2: 28 mmol/L (ref 20–29)
Calcium: 9.5 mg/dL (ref 8.7–10.3)
Chloride: 96 mmol/L (ref 96–106)
Creatinine, Ser: 0.89 mg/dL (ref 0.57–1.00)
Globulin, Total: 2.8 g/dL (ref 1.5–4.5)
Glucose: 99 mg/dL (ref 70–99)
Potassium: 4.6 mmol/L (ref 3.5–5.2)
Sodium: 139 mmol/L (ref 134–144)
Total Protein: 6.9 g/dL (ref 6.0–8.5)
eGFR: 67 mL/min/{1.73_m2} (ref >59)

## 2023-05-25 LAB — CBC
Hematocrit: 39.1 % (ref 34.0–46.6)
Hemoglobin: 12.7 g/dL (ref 11.1–15.9)
MCH: 29.8 pg (ref 26.6–33.0)
MCHC: 32.5 g/dL (ref 31.5–35.7)
MCV: 92 fL (ref 79–97)
Platelets: 354 10*3/uL (ref 150–450)
RBC: 4.26 x10E6/uL (ref 3.77–5.28)
RDW: 13.1 % (ref 11.7–15.4)
WBC: 9.6 10*3/uL (ref 3.4–10.8)

## 2023-05-25 NOTE — Assessment & Plan Note (Signed)
 Chronic pain managed with buprenorphine , Voltaren gel, Cymbalta , Percocet, Lyrica , and Zanaflex . Undergoing injections and ablations. Recent falls exacerbated chronic pain, especially in the lower back and ribs. Continue current regimen and follow-up with pain clinic. - Continue current pain management medications - Follow up with pain clinic for ongoing treatment and ablations

## 2023-05-25 NOTE — Assessment & Plan Note (Signed)
 Reports recent episodes of hyperkalemia noted on blood work Will order labs to check on this.,    Lymphedema Significant leg swelling managed with lymphedema pumps and clinic visits, improving mobility. - Continue using lymphedema pumps - Continue follow-up at the lymphedema clinic  General Health Maintenance High potassium and borderline diabetes. Previous blood work from October showed normal blood counts and sugar levels. Importance of regular monitoring discussed. - Order blood tests to check potassium levels and blood sugar - Monitor blood counts  Follow-up - Follow up with results of x-ray and blood tests - Continue regular appointments with pain clinic and lymphedema clinic.

## 2023-05-25 NOTE — Assessment & Plan Note (Signed)
 Severe pain in the left flank and rib area following falls on January 28 and May 19, 2023. Pain exacerbated by movement and deep breaths.   Physical exam shows significant bruising and tenderness in the left lumbar paraspinal region.   CT scans and x-rays from the ER did not REPORT fractures.   Differential diagnosis includes rib fracture, soft tissue injury, and potential internal injury.   Conservative treatment for non-displaced rib fractures; urgent intervention if displaced. - Order chest x-ray to evaluate for rib fractures AS I HIGHLY SUSPECT RIB FRACTURE DUE TO SEVERE POINT TENDERNESS IN THE POSTERIOR ASPECT OF LEFT RIB CAGE AND A PALPABLE/ AUDIBLE POP IN THE LEFT LOWER RIBS - Order CBC to check for internal bleeding - Advise ice application (20 minutes on, 20 minutes off) - Continue current pain management regimen - Advise ER visit if severe symptoms develop

## 2023-05-26 MED ORDER — METHYLPREDNISOLONE 4 MG PO TBPK: ORAL_TABLET | ORAL | 1 refills | Status: DC

## 2023-05-30 DIAGNOSIS — G894 Chronic pain syndrome: Secondary | ICD-10-CM | POA: Diagnosis not present

## 2023-05-30 DIAGNOSIS — M961 Postlaminectomy syndrome, not elsewhere classified: Secondary | ICD-10-CM | POA: Diagnosis not present

## 2023-05-30 DIAGNOSIS — M47816 Spondylosis without myelopathy or radiculopathy, lumbar region: Secondary | ICD-10-CM | POA: Diagnosis not present

## 2023-06-06 ENCOUNTER — Inpatient Hospital Stay: Payer: Self-pay | Admitting: Family Medicine

## 2023-06-18 ENCOUNTER — Other Ambulatory Visit: Payer: Self-pay | Admitting: Family Medicine

## 2023-06-18 DIAGNOSIS — E039 Hypothyroidism, unspecified: Secondary | ICD-10-CM

## 2023-06-21 DIAGNOSIS — F1120 Opioid dependence, uncomplicated: Secondary | ICD-10-CM | POA: Diagnosis not present

## 2023-06-21 DIAGNOSIS — E785 Hyperlipidemia, unspecified: Secondary | ICD-10-CM | POA: Diagnosis not present

## 2023-06-21 DIAGNOSIS — I209 Angina pectoris, unspecified: Secondary | ICD-10-CM | POA: Diagnosis not present

## 2023-06-21 DIAGNOSIS — I509 Heart failure, unspecified: Secondary | ICD-10-CM | POA: Diagnosis not present

## 2023-06-21 DIAGNOSIS — E039 Hypothyroidism, unspecified: Secondary | ICD-10-CM | POA: Diagnosis not present

## 2023-06-21 DIAGNOSIS — E559 Vitamin D deficiency, unspecified: Secondary | ICD-10-CM | POA: Diagnosis not present

## 2023-06-21 DIAGNOSIS — D509 Iron deficiency anemia, unspecified: Secondary | ICD-10-CM | POA: Diagnosis not present

## 2023-06-21 DIAGNOSIS — G250 Essential tremor: Secondary | ICD-10-CM | POA: Diagnosis not present

## 2023-06-21 DIAGNOSIS — E261 Secondary hyperaldosteronism: Secondary | ICD-10-CM | POA: Diagnosis not present

## 2023-06-21 DIAGNOSIS — F3342 Major depressive disorder, recurrent, in full remission: Secondary | ICD-10-CM | POA: Diagnosis not present

## 2023-06-21 DIAGNOSIS — I110 Hypertensive heart disease with heart failure: Secondary | ICD-10-CM | POA: Diagnosis not present

## 2023-06-23 DIAGNOSIS — M47816 Spondylosis without myelopathy or radiculopathy, lumbar region: Secondary | ICD-10-CM | POA: Diagnosis not present

## 2023-06-29 DIAGNOSIS — R269 Unspecified abnormalities of gait and mobility: Secondary | ICD-10-CM | POA: Insufficient documentation

## 2023-06-29 DIAGNOSIS — M533 Sacrococcygeal disorders, not elsewhere classified: Secondary | ICD-10-CM | POA: Diagnosis not present

## 2023-06-29 DIAGNOSIS — M47816 Spondylosis without myelopathy or radiculopathy, lumbar region: Secondary | ICD-10-CM | POA: Diagnosis not present

## 2023-06-29 DIAGNOSIS — G894 Chronic pain syndrome: Secondary | ICD-10-CM | POA: Diagnosis not present

## 2023-07-06 DIAGNOSIS — M48061 Spinal stenosis, lumbar region without neurogenic claudication: Secondary | ICD-10-CM | POA: Diagnosis not present

## 2023-07-06 DIAGNOSIS — M4726 Other spondylosis with radiculopathy, lumbar region: Secondary | ICD-10-CM | POA: Diagnosis not present

## 2023-07-06 DIAGNOSIS — M5116 Intervertebral disc disorders with radiculopathy, lumbar region: Secondary | ICD-10-CM | POA: Diagnosis not present

## 2023-07-06 DIAGNOSIS — M47816 Spondylosis without myelopathy or radiculopathy, lumbar region: Secondary | ICD-10-CM | POA: Diagnosis not present

## 2023-07-06 DIAGNOSIS — M4807 Spinal stenosis, lumbosacral region: Secondary | ICD-10-CM | POA: Diagnosis not present

## 2023-07-18 ENCOUNTER — Ambulatory Visit (INDEPENDENT_AMBULATORY_CARE_PROVIDER_SITE_OTHER)

## 2023-07-18 VITALS — BP 128/70 | HR 60 | Temp 97.7°F | Ht 64.0 in | Wt 220.2 lb

## 2023-07-18 DIAGNOSIS — I10 Essential (primary) hypertension: Secondary | ICD-10-CM | POA: Diagnosis not present

## 2023-07-18 DIAGNOSIS — B372 Candidiasis of skin and nail: Secondary | ICD-10-CM | POA: Insufficient documentation

## 2023-07-18 DIAGNOSIS — M5137 Other intervertebral disc degeneration, lumbosacral region with discogenic back pain only: Secondary | ICD-10-CM

## 2023-07-18 MED ORDER — PREDNISONE 20 MG PO TABS: 40.0000 mg | ORAL_TABLET | Freq: Every day | ORAL | 0 refills | Status: AC

## 2023-07-18 MED ORDER — NYSTATIN 100000 UNIT/GM EX CREA: 1.0000 | TOPICAL_CREAM | Freq: Three times a day (TID) | CUTANEOUS | 0 refills | Status: AC

## 2023-07-18 NOTE — Progress Notes (Signed)
 Acute Office Visit  Subjective:    Patient ID: Lauren Roth, female    DOB: 02/12/1946, 78 y.o.   MRN: [id number]  Chief Complaint  Patient presents with   Back Pain    Across the lower back     Discussed the use of AI scribe software for clinical note transcription with the patient, who gave verbal consent to proceed.       HPI: Lauren Roth is a 78 year old female who presents with severe left-sided pain and gait issues. She has been referred to a neurologist for further evaluation of her gait issues.  She experiences severe pain primarily on her left side, radiating to her stomach and back. The pain is persistent and significantly affects her ability to move and perform daily activities. It is excruciating, especially when twisting, and has been accompanied by episodes of crying due to its intensity.  She has a history of a fall in February, resulting in a broken knee. Prior to the fall, she was undergoing injections for back pain targeting the SI joints and had reached the stage of ablation. However, she did not experience symptom relief from the injections, and the ablation was not performed. She has undergone an MRI at [facility], but the results are not yet available. She is experiencing worsening gait issues, and the pain has become so severe that she was unable to move upon waking two days ago.  Her current medications include Voltaren, which she uses on her knee but not on her back due to pain, Cymbalta 60 mg daily, buprenorphine, Lyrica, and tizanidine. Buprenorphine provides the most relief. She is also under the care of a psychiatrist who manages her medications.  She is concerned about inflammation throughout her body, causing swelling and pain in her joints and muscles. She has a history of fibromyalgia and arthritis, including spondylitis, and notes that her pain is different and more severe than usual. She reports widespread body pain and  inflammation, which she associates with fibromyalgia.  She has a recurring issue with yeast infections in her skin folds, currently presenting as redness. She has previously used creams to manage this condition.  No tingling or numbness down her legs, but she reports occasional tingling in her buttocks. She describes severe pain in her back and left side, with radiation to her stomach.  Past Medical History:  Diagnosis Date   Allergic rhinitis 01/16/2009   Qualifier: Diagnosis of  By: Everardo All MD, Clayborn Heron of this note might be different from the original. Overview:  Qualifier: Diagnosis of  By: Everardo All MD, Gregary Signs A   Arthritis    Chronic migraine w/o aura, not intractable, w/o stat migr    Chronic pain of right knee 06/12/2021   Chronic pain syndrome 07/30/2013   DEGENERATIVE DISC DISEASE, LUMBOSACRAL SPINE 01/16/2009   Qualifier: Diagnosis of  By: Everardo All MD, Gregary Signs A    Depression    DIAPHORESIS 01/16/2009   Qualifier: Diagnosis of  By: Everardo All MD, Sean A    Edema 01/16/2009   Qualifier: Diagnosis of  By: Everardo All MD, Cleophas Dunker   Formatting of this note might be different from the original. Overview:  Qualifier: Diagnosis of  By: Everardo All MD, Sean A   Essential hypertension 01/28/2017   Essential thrombocytosis (HCC) 11/25/2017   Fibromyalgia    GERD 01/16/2009   Qualifier: Diagnosis of  By: Everardo All MD, Sean A    Heart failure (HCC) 05/08/2021   Hypertension  HYPERTENSION 01/16/2009   Qualifier: Diagnosis of  By: Everardo All MD, Gregary Signs A    Hypokalemia 01/16/2009   Qualifier: Diagnosis of  By: Everardo All MD, Clayborn Heron of this note might be different from the original. Overview:  Qualifier: Diagnosis of  By: Everardo All MD, Sean A   Left hip pain 05/24/2019   Lumbosacral spondylosis 10/09/2010   Lymphedema 02/10/2021   Medication management 08/25/2010   Memory loss 01/16/2009   Qualifier: Diagnosis of  By: Everardo All MD, Cleophas Dunker   Formatting of this note might be different from the  original. Overview:  Qualifier: Diagnosis of  By: Everardo All MD, Sean A   Menopausal and postmenopausal disorder 01/16/2009   Qualifier: Diagnosis of  By: Everardo All MD, Cleophas Dunker   Formatting of this note might be different from the original. Overview:  Qualifier: Diagnosis of  By: Everardo All MD, Sean A   Moderate aortic regurgitation 05/21/2019   Neuropathic pain 09/25/2020   Neuropathy    Obesity (BMI 30.0-34.9) 10/13/2021   Pain in soft tissues of limb 06/20/2012   Formatting of this note might be different from the original. Overview:  IMPRESSION: Left knee pain Formatting of this note might be different from the original. IMPRESSION: Left knee pain   Pain in thoracic spine 07/30/2013   Palpitations 01/28/2017   Primary osteoarthritis of right knee 10/06/2014   Restless legs syndrome 08/25/2010   Shortness of breath on exertion 01/28/2017   SI (sacroiliac) pain 05/10/2016   Thoracic spondylosis 11/20/2010   Overview:  Overview:  IMPRESSION: Thoracic radiculopathy Overview:  IMPRESSION: Thoracic radiculopathy  Formatting of this note might be different from the original. Overview:  IMPRESSION: Thoracic radiculopathy Formatting of this note might be different from the original. IMPRESSION: Thoracic radiculopathy   Thrombocythemia, essential (HCC) 09/12/2013   Thyroid disease    TMJ (dislocation of temporomandibular joint) 07/30/2013   Tremor 12/29/2021   Unspecified hypothyroidism 01/16/2009   Qualifier: Diagnosis of  By: Everardo All MD, Sean A    Uterine cancer (HCC) 07/30/2013    Past Surgical History:  Procedure Laterality Date   APPENDECTOMY     CERVICAL DISC SURGERY     CHOLECYSTECTOMY     FRACTURE SURGERY Left    Left arm has a metal plate   KNEE SURGERY Bilateral    TKR   ROBOTIC ASSISTED LAP VAGINAL HYSTERECTOMY     did not take ovaries. had uterine cancer.    Family History  Problem Relation Age of Onset   Deep vein thrombosis Father    Heart disease Father    Heart failure  Father    Alzheimer's disease Father    Heart disease Brother    Pulmonary embolism Brother    Heart disease Brother     Social History   Socioeconomic History   Marital status: Married    Spouse name: Not on file   Number of children: Not on file   Years of education: Not on file   Highest education level: GED or equivalent  Occupational History   Not on file  Tobacco Use   Smoking status: Former   Smokeless tobacco: Never   Tobacco comments:    at age 73  Vaping Use   Vaping status: Never Used  Substance and Sexual Activity   Alcohol use: No   Drug use: No   Sexual activity: Not on file  Other Topics Concern   Not on file  Social History Narrative   Not on file   Social  Drivers of Health   Financial Resource Strain: Low Risk  (03/21/2023)   Overall Financial Resource Strain (CARDIA)    Difficulty of Paying Living Expenses: Not hard at all  Food Insecurity: No Food Insecurity (03/21/2023)   Hunger Vital Sign    Worried About Running Out of Food in the Last Year: Never true    Ran Out of Food in the Last Year: Never true  Transportation Needs: No Transportation Needs (03/21/2023)   PRAPARE - Administrator, Civil Service (Medical): No    Lack of Transportation (Non-Medical): No  Physical Activity: Inactive (03/21/2023)   Exercise Vital Sign    Days of Exercise per Week: 0 days    Minutes of Exercise per Session: 0 min  Stress: No Stress Concern Present (03/21/2023)   [employer] of Occupational Health - Occupational Stress Questionnaire    Feeling of Stress : Not at all  Social Connections: Socially Integrated (03/21/2023)   Social Connection and Isolation Panel [NHANES]    Frequency of Communication with Friends and Family: More than three times a week    Frequency of Social Gatherings with Friends and Family: Twice a week    Attends Religious Services: More than 4 times per year    Active Member of [employer] or Organizations: Yes    Attends Museum/gallery exhibitions officer: More than 4 times per year    Marital Status: Married  Catering manager Violence: Not At Risk (03/21/2023)   Humiliation, Afraid, Rape, and Kick questionnaire    Fear of Current or Ex-Partner: No    Emotionally Abused: No    Physically Abused: No    Sexually Abused: No    Outpatient Medications Prior to Visit  Medication Sig Dispense Refill   albuterol (VENTOLIN HFA) 108 (90 Base) MCG/ACT inhaler Inhale 2 puffs into the lungs every 6 (six) hours as needed for wheezing or shortness of breath.     aspirin 81 MG chewable tablet Chew 81 mg by mouth daily.     Biotin 10 MG TABS Take by mouth.     buprenorphine (SUBUTEX) 2 MG SUBL SL tablet Place 2 mg under the tongue daily.     buPROPion (WELLBUTRIN XL) 300 MG 24 hr tablet Take 1 tablet by mouth daily.     Calcium Carb-Cholecalciferol 500-10 MG-MCG TABS Take by mouth.     cloNIDine (CATAPRES) 0.1 MG tablet Take 1 tablet by mouth 2 (two) times daily.     colchicine 0.6 MG tablet 2 DAILY FIRST DAY, THEN ONE DAILY FOR ONE WEEK. 8 tablet 1   diclofenac Sodium (VOLTAREN) 1 % GEL Apply 0.5 g topically 4 (four) times daily.     diltiazem (TIAZAC) 240 MG 24 hr capsule Take 240 mg by mouth daily.     donepezil (ARICEPT) 10 MG tablet Take 1 tablet (10 mg total) by mouth daily. 90 tablet 1   DULoxetine (CYMBALTA) 60 MG capsule Take 60 mg by mouth daily.     ergocalciferol (VITAMIN D2) 1.25 MG (50000 UT) capsule Take 50,000 Units by mouth once a week.     finasteride (PROSCAR) 5 MG tablet Take 5 mg by mouth daily.     fluticasone (FLONASE) 50 MCG/ACT nasal spray Place 1 spray into both nostrils daily as needed for allergies or rhinitis. 15.8 mL 1   furosemide (LASIX) 20 MG tablet Take 1 tablet (20 mg total) by mouth 2 (two) times daily. 180 tablet 1   iron polysaccharides (NIFEREX) 150 MG capsule Take  150 mg by mouth daily.     levothyroxine (SYNTHROID) 50 MCG tablet Take 1 tablet by mouth once daily 90 tablet 0   lidocaine  (LIDODERM) 5 % Place onto the skin.     loratadine (CLARITIN) 10 MG tablet Take 10 mg by mouth daily.     losartan (COZAAR) 50 MG tablet Take 50 mg by mouth daily.     Misc Natural Products (FIBER 7 PO) Take by mouth.     montelukast (SINGULAIR) 10 MG tablet Take 10 mg by mouth at bedtime.     nitroGLYCERIN (NITROSTAT) 0.4 MG SL tablet Place 1 tablet (0.4 mg total) under the tongue as needed for chest pain. 25 tablet 3   omeprazole (PRILOSEC) 20 MG capsule Take 1 capsule by mouth daily.     oxybutynin (DITROPAN-XL) 10 MG 24 hr tablet Take 10 mg by mouth daily.     oxyCODONE-acetaminophen (PERCOCET) 10-325 MG tablet Take 1 tablet by mouth every 4 (four) hours as needed for pain.     pregabalin (LYRICA) 75 MG capsule Take 150 mg by mouth at bedtime.     propranolol ER (INDERAL LA) 60 MG 24 hr capsule Take 60 mg by mouth daily.     rOPINIRole (REQUIP) 1 MG tablet Take 1 tablet by mouth daily.     Suvorexant (BELSOMRA) 15 MG TABS Take 1 tab at night     thiamine (VITAMIN B1) 100 MG tablet Take by mouth.     tiZANidine (ZANAFLEX) 4 MG tablet Take 4 mg by mouth 3 (three) times daily as needed for muscle spasms.     topiramate (TOPAMAX) 25 MG tablet Take 25 mg by mouth 2 (two) times daily.     traZODone (DESYREL) 100 MG tablet Take 100 mg by mouth at bedtime.     nystatin cream (MYCOSTATIN) Apply 1 application topically 2 (two) times daily.     methylPREDNISolone (MEDROL DOSEPAK) 4 MG TBPK tablet 6 tabs on day 1, 5 tabs on day 2, 4 tabs on day 3, 3 tabs on day 4, 2 tabs on day 5,  1 tab on day 6. 21 tablet 1   Vitamin D, Ergocalciferol, (DRISDOL) 1.25 MG (50000 UNIT) CAPS capsule Take 50,000 Units by mouth once a week.     No facility-administered medications prior to visit.    Allergies  Allergen Reactions   Cottonseed Oil Hives and Itching    Hives, Itching Raw Cotton    Codeine Sulfate     Review of Systems  Constitutional:  Negative for chills, fatigue and fever.  HENT:  Negative for  congestion, ear pain and sinus pain.   Respiratory:  Negative for cough and shortness of breath.   Cardiovascular:  Negative for chest pain.  Gastrointestinal:  Positive for abdominal pain. Negative for constipation, diarrhea, nausea and vomiting.  Musculoskeletal:  Positive for arthralgias, back pain and myalgias.  Neurological:  Negative for headaches.       Objective:        07/18/2023    2:33 PM 05/24/2023    2:28 PM 05/24/2023    1:37 PM  Vitals with BMI  Height 5\' 4"   5\' 4"   Weight 220 lbs 3 oz  214 lbs  BMI 37.78  36.72  Systolic 128 124 161  Diastolic 70 62 80  Pulse 60  74    No data found.   Physical Exam Vitals and nursing note reviewed.  Constitutional:      Appearance: She is obese.  HENT:  Head: Atraumatic.  Cardiovascular:     Rate and Rhythm: Normal rate and regular rhythm.  Pulmonary:     Effort: Pulmonary effort is normal.     Breath sounds: Normal breath sounds.  Musculoskeletal:        General: Tenderness (generalized tenderness in low back, mid back, flank, knees etc) present.  Neurological:     General: No focal deficit present.     Mental Status: She is alert.  Psychiatric:        Mood and Affect: Mood normal.     Health Maintenance Due  Topic Date Due   Hepatitis C Screening  Never done   DEXA SCAN  Never done    There are no preventive care reminders to display for this patient.   Lab Results  Component Value Date   TSH 1.870 02/03/2023   Lab Results  Component Value Date   WBC 9.6 05/24/2023   HGB 12.7 05/24/2023   HCT 39.1 05/24/2023   MCV 92 05/24/2023   PLT 354 05/24/2023   Lab Results  Component Value Date   NA 139 05/24/2023   K 4.6 05/24/2023   CO2 28 05/24/2023   GLUCOSE 99 05/24/2023   BUN 10 05/24/2023   CREATININE 0.89 05/24/2023   BILITOT 0.3 05/24/2023   ALKPHOS 91 05/24/2023   AST 22 05/24/2023   ALT 25 05/24/2023   PROT 6.9 05/24/2023   ALBUMIN 4.1 05/24/2023   CALCIUM 9.5 05/24/2023   EGFR 67  05/24/2023   Lab Results  Component Value Date   CHOL 209 (H) 02/03/2023   Lab Results  Component Value Date   HDL 44 02/03/2023   Lab Results  Component Value Date   LDLCALC 137 (H) 02/03/2023   Lab Results  Component Value Date   TRIG 155 (H) 02/03/2023   Lab Results  Component Value Date   CHOLHDL 4.8 (H) 02/03/2023   Lab Results  Component Value Date   HGBA1C 5.5 02/03/2023       Assessment & Plan:  Essential hypertension Assessment & Plan: Well controlled.  Taking amlodipine 5 mg daily,  clonidine 0.1 mg twice a day, diltiazem 240 mg daily, furosemide 20 mg daily, losartan 50 mg daily, spironolactone 25 mg daily also on propranolol 60 mg daily  Continue to work on eating a healthy diet and exercise.     Degeneration of intervertebral disc of lumbosacral region with discogenic back pain Assessment & Plan: Chronic back pain exacerbated by a recent fall, resulting in severe, sharp nerve pain radiating to the left side, abdomen, and back. Associated with bulging discs, arthritis, and spondylitis. Awaiting MRI results for further evaluation. Pain limits mobility and daily activities. -advised to purchase on [employer]- lumbar back brace for support, to be used sparingly to avoid weakening the core muscles - Prescribe prednisone:20 mg,  2 tablets daily for 5 days to manage severe pain, with potential side effects including increased blood sugar, weight gain, and elevated blood pressure - Await MRI results for further evaluation - Continue current pain management medications: buprenorphine, Cymbalta, Lyrica, and tizanidine   Candidal intertrigo Assessment & Plan: Intertrigo in skin folds, presenting as redness and potential yeast infection due to moisture and inactivity. Prefers cream over powder for treatment. - Prescribe NYSTATIN antifungal cream to be applied up to three times daily with refills    Other orders -     Nystatin; Apply 1 Application topically 3  (three) times daily.  Dispense: 90 g; Refill: 0 -  predniSONE; Take 2 tablets (40 mg total) by mouth daily with breakfast for 5 days.  Dispense: 10 tablet; Refill: 0     Assessment and Plan       Meds ordered this encounter  Medications   nystatin cream (MYCOSTATIN)    Sig: Apply 1 Application topically 3 (three) times daily.    Dispense:  90 g    Refill:  0   predniSONE (DELTASONE) 20 MG tablet    Sig: Take 2 tablets (40 mg total) by mouth daily with breakfast for 5 days.    Dispense:  10 tablet    Refill:  0    No orders of the defined types were placed in this encounter.    Follow-up: Return in about 8 weeks (around 09/12/2023) for chronic disease follow up.  An After Visit Summary was printed and given to the patient.  Windell Moment, MD [facility] [phone]

## 2023-07-18 NOTE — Assessment & Plan Note (Addendum)
 Chronic back pain exacerbated by a recent fall, resulting in severe, sharp nerve pain radiating to the left side, abdomen, and back. Associated with bulging discs, arthritis, and spondylitis. Awaiting MRI results for further evaluation. Pain limits mobility and daily activities. -advised to purchase on [employer]- lumbar back brace for support, to be used sparingly to avoid weakening the core muscles - Prescribe prednisone:20 mg,  2 tablets daily for 5 days to manage severe pain, with potential side effects including increased blood sugar, weight gain, and elevated blood pressure - she already sees the pain clinic. Is on multiple medications including Subutex which she states is helping. No suspicion for diversion, - Await MRI results for further evaluation - Continue current pain management medications: buprenorphine, Cymbalta, Lyrica, and tizanidine

## 2023-07-18 NOTE — Assessment & Plan Note (Signed)
 Intertrigo in skin folds, presenting as redness and potential yeast infection due to moisture and inactivity. Prefers cream over powder for treatment. - Prescribe NYSTATIN antifungal cream to be applied up to three times daily with refills

## 2023-07-18 NOTE — Patient Instructions (Signed)
 VISIT SUMMARY:  Today, you were seen for severe left-sided pain and gait issues. You have a history of a fall that resulted in a broken knee and have been experiencing worsening pain and mobility issues. Your current medications include Voltaren, Cymbalta, buprenorphine, Lyrica, and tizanidine. You are also under the care of a psychiatrist for medication management. You have a history of fibromyalgia, arthritis, and recurring yeast infections in your skin folds.  YOUR PLAN:  -CHRONIC BACK PAIN: Chronic back pain is long-lasting pain in your back that can be caused by various conditions such as bulging discs, arthritis, and spondylitis. You will be prescribed a lumbar back brace for support, to be used sparingly to avoid weakening your core muscles. Additionally, you will take prednisone, 2 tablets daily for 5 days, to manage severe pain. Be aware of potential side effects like increased blood sugar, weight gain, and elevated blood pressure. We will await your MRI results for further evaluation and continue your current pain management medications: buprenorphine, Cymbalta, Lyrica, and tizanidine.  -FIBROMYALGIA: Fibromyalgia is a condition characterized by widespread pain in your joints and muscles. You are currently experiencing an exacerbation, possibly due to weather changes. Continue taking your current medications for fibromyalgia management.  -INTERTRIGO: Intertrigo is a rash that occurs in skin folds due to moisture and inactivity, often leading to yeast infections. You will be prescribed an antifungal cream to be applied up to three times daily with refills.  INSTRUCTIONS:  Please follow up with your pain management specialist after your MRI results are available for ongoing management of your chronic pain.

## 2023-07-18 NOTE — Assessment & Plan Note (Addendum)
 Well controlled.  Taking amlodipine 5 mg daily,  clonidine 0.1 mg twice a day, diltiazem 240 mg daily, furosemide 20 mg daily, losartan 50 mg daily, spironolactone 25 mg daily also on propranolol 60 mg daily  Continue to work on eating a healthy diet and exercise.

## 2023-07-20 ENCOUNTER — Other Ambulatory Visit: Payer: Self-pay

## 2023-07-20 DIAGNOSIS — K219 Gastro-esophageal reflux disease without esophagitis: Secondary | ICD-10-CM

## 2023-07-20 MED ORDER — OMEPRAZOLE 20 MG PO CPDR: 20.0000 mg | DELAYED_RELEASE_CAPSULE | Freq: Every day | ORAL | 1 refills | Status: DC

## 2023-08-01 DIAGNOSIS — F411 Generalized anxiety disorder: Secondary | ICD-10-CM | POA: Diagnosis not present

## 2023-08-01 DIAGNOSIS — F330 Major depressive disorder, recurrent, mild: Secondary | ICD-10-CM | POA: Diagnosis not present

## 2023-08-03 DIAGNOSIS — G894 Chronic pain syndrome: Secondary | ICD-10-CM | POA: Diagnosis not present

## 2023-08-03 DIAGNOSIS — M4826 Kissing spine, lumbar region: Secondary | ICD-10-CM | POA: Insufficient documentation

## 2023-08-03 DIAGNOSIS — M47816 Spondylosis without myelopathy or radiculopathy, lumbar region: Secondary | ICD-10-CM | POA: Diagnosis not present

## 2023-08-03 DIAGNOSIS — M533 Sacrococcygeal disorders, not elsewhere classified: Secondary | ICD-10-CM | POA: Diagnosis not present

## 2023-08-03 DIAGNOSIS — M961 Postlaminectomy syndrome, not elsewhere classified: Secondary | ICD-10-CM | POA: Diagnosis not present

## 2023-08-11 DIAGNOSIS — B079 Viral wart, unspecified: Secondary | ICD-10-CM | POA: Diagnosis not present

## 2023-08-11 DIAGNOSIS — L219 Seborrheic dermatitis, unspecified: Secondary | ICD-10-CM | POA: Diagnosis not present

## 2023-08-11 DIAGNOSIS — R233 Spontaneous ecchymoses: Secondary | ICD-10-CM | POA: Diagnosis not present

## 2023-08-11 DIAGNOSIS — L820 Inflamed seborrheic keratosis: Secondary | ICD-10-CM | POA: Diagnosis not present

## 2023-08-11 DIAGNOSIS — L570 Actinic keratosis: Secondary | ICD-10-CM | POA: Diagnosis not present

## 2023-08-12 DIAGNOSIS — H26493 Other secondary cataract, bilateral: Secondary | ICD-10-CM | POA: Diagnosis not present

## 2023-08-16 ENCOUNTER — Encounter: Payer: Self-pay | Admitting: Physician Assistant

## 2023-08-16 ENCOUNTER — Ambulatory Visit: Payer: Self-pay | Admitting: Family Medicine

## 2023-08-16 ENCOUNTER — Ambulatory Visit (INDEPENDENT_AMBULATORY_CARE_PROVIDER_SITE_OTHER): Admitting: Physician Assistant

## 2023-08-16 VITALS — BP 132/68 | HR 73 | Temp 97.0°F | Ht 64.0 in | Wt 219.0 lb

## 2023-08-16 DIAGNOSIS — M797 Fibromyalgia: Secondary | ICD-10-CM | POA: Diagnosis not present

## 2023-08-16 DIAGNOSIS — M5137 Other intervertebral disc degeneration, lumbosacral region with discogenic back pain only: Secondary | ICD-10-CM

## 2023-08-16 DIAGNOSIS — I890 Lymphedema, not elsewhere classified: Secondary | ICD-10-CM

## 2023-08-16 DIAGNOSIS — J410 Simple chronic bronchitis: Secondary | ICD-10-CM | POA: Diagnosis not present

## 2023-08-16 DIAGNOSIS — S2232XA Fracture of one rib, left side, initial encounter for closed fracture: Secondary | ICD-10-CM | POA: Diagnosis not present

## 2023-08-16 DIAGNOSIS — I5032 Chronic diastolic (congestive) heart failure: Secondary | ICD-10-CM

## 2023-08-16 MED ORDER — PREDNISONE 20 MG PO TABS: ORAL_TABLET | ORAL | 0 refills | Status: AC

## 2023-08-16 NOTE — Assessment & Plan Note (Signed)
 Long-standing fibromyalgia with severe flares, especially during weather changes. Symptoms include widespread pain. Previous diagnosis over ten years ago, no recent rheumatology evaluation. - Consider rheumatology referral if pain persists post-injection. - Discuss potential long-term steroid use if fibromyalgia confirmed as pain contributor.

## 2023-08-16 NOTE — Patient Instructions (Signed)
 VISIT SUMMARY:  Today, we discussed your chronic back pain, fibromyalgia, neuropathy in your legs and feet, lymphedema, and recent rib fracture. We reviewed your symptoms and current treatments, and we have made a plan to help manage your pain and improve your quality of life.  YOUR PLAN:  -CHRONIC BACK PAIN: Chronic back pain is long-lasting pain in your back that can be severe and affect your daily activities. We will proceed with the scheduled epidural injection to help manage your pain. In the meantime, you will start a prednisone  taper to reduce inflammation and pain. If your pain continues after the injection, we may refer you to a rheumatologist for further evaluation.  -FIBROMYALGIA: Fibromyalgia is a condition that causes widespread pain and tenderness in the muscles and joints. If your pain continues after the epidural injection, we may refer you to a rheumatologist to evaluate your fibromyalgia. We also discussed the possibility of long-term steroid use if fibromyalgia is confirmed as a significant contributor to your pain.  -NEUROPATHY IN LEGS AND FEET: Neuropathy is nerve damage that causes weakness, numbness, and pain, usually in your hands and feet. It can affect your ability to walk and cause instability. If your symptoms worsen, we will consider referring you to a neurologist for further evaluation.  -LYMPHEDEMA: Lymphedema is swelling that generally occurs in one of your arms or legs, often caused by the removal of or damage to your lymph nodes. It can contribute to your gait instability. We will continue to monitor this condition.  -RIB FRACTURE: A rib fracture is a break in one of the bones in your rib cage, often caused by trauma such as a fall. Currently, there are no acute issues with your rib fracture, and we will continue to monitor your recovery.  INSTRUCTIONS:  Please follow up as scheduled for your epidural injection next month. If your pain persists after the injection,  we will consider referring you to a rheumatologist. If your neuropathy symptoms worsen, we may refer you to a neurologist. Continue to monitor your rib fracture and lymphedema, and let us  know if you experience any new or worsening symptoms.

## 2023-08-16 NOTE — Telephone Encounter (Signed)
 Chief Complaint: Back pain (lower/mid) x2-3 weeks worsening  Symptoms: "Inflammation all over body," 10/10 pain level, weakness Frequency: Constant Pertinent Negatives: Patient denies numbness  Disposition: [x] Appointment(In office)  Additional Notes: Pt states she fell on her back a month ago and since then has had severe back trouble. Pt goes to the pain clinic but they are unable to give her any more pain medications at this time. Pt scheduled for an appointment today. This RN educated pt on home care, new-worsening symptoms, when to call back/seek emergent care. Pt verbalized understanding and agrees to plan.     Copied from CRM [id number]. Topic: Clinical - Red Word Triage >> Aug 16, 2023  9:55 AM Clyde Darling P wrote: Red Word that prompted transfer to Nurse Triage: pt had a fall years ago, back has been giving her trouble since then but is now experiencing inflammation throughout body and experiencing back pain Reason for Disposition  [1] SEVERE back pain (e.g., excruciating, unable to do any normal activities) AND [2] not improved 2 hours after pain medicine  Answer Assessment - Initial Assessment Questions Chief Complaint: Back pain (lower/mid) x2-3 weeks worsening  Symptoms: "Inflammation all over body," 10/10 pain level, weakness  Frequency: Constant  Pertinent Negatives: Patient denies numbness  Protocols used: Back Pain-A-AH

## 2023-08-16 NOTE — Progress Notes (Signed)
 Acute Office Visit  Subjective:    Patient ID: Lauren Roth, female    DOB: Apr 08, 1946, 78 y.o.   MRN: [id number]  Chief Complaint  Patient presents with   BACK PAIN    HPI: Patient is in today for chronic back pain.  Discussed the use of AI scribe software for clinical note transcription with the patient, who gave verbal consent to proceed.  History of Present Illness   Lauren Roth is a 78 year old female with chronic back pain who presents with worsening back pain.  She experiences severe, sharp, and constant back pain, worsened by movement and daily activities such as riding in a car, bowel movements, sneezing, and coughing. The pain has been present since her early thirties and has led to disability. Recent treatments, including injections, have become ineffective, and an epidural is scheduled for next month.  A fall a couple of months ago resulted in a broken rib, contributing to her pain. She has had multiple falls since then. She uses a back brace for marginal relief and has difficulty bending over, requiring her to exhale and hold her breath to pick things up.  She has lymphedema and neuropathy in both legs and feet, affecting her gait and causing her to bump into walls and people. She uses a wheelchair and walker but can walk with difficulty. Various treatments, including nerve blocks and physical therapy, have been tried without significant relief. Prednisone  provided temporary relief, and she is currently on hydroxychloroquine for undifferentiated arthritis, which helps somewhat with her symptoms.  She has a history of fibromyalgia, which flares up and is exacerbated by weather changes, causing debilitating pain. No recent bronchitis or worsening heart failure symptoms, although she experienced palpitations the previous day.       Past Medical History:  Diagnosis Date   Allergic rhinitis 01/16/2009   Qualifier: Diagnosis of  By: Washington Hacker MD,  Corbett Desanctis of this note might be different from the original. Overview:  Qualifier: Diagnosis of  By: Washington Hacker MD, Kaaren Ora A   Arthritis    Chronic migraine w/o aura, not intractable, w/o stat migr    Chronic pain of right knee 06/12/2021   Chronic pain syndrome 07/30/2013   DEGENERATIVE DISC DISEASE, LUMBOSACRAL SPINE 01/16/2009   Qualifier: Diagnosis of  By: Washington Hacker MD, Kaaren Ora A    Depression    DIAPHORESIS 01/16/2009   Qualifier: Diagnosis of  By: Washington Hacker MD, Sean A    Edema 01/16/2009   Qualifier: Diagnosis of  By: Washington Hacker MD, Leota Randy   Formatting of this note might be different from the original. Overview:  Qualifier: Diagnosis of  By: Washington Hacker MD, Sean A   Essential hypertension 01/28/2017   Essential thrombocytosis (HCC) 11/25/2017   Fibromyalgia    GERD 01/16/2009   Qualifier: Diagnosis of  By: Washington Hacker MD, Sean A    Heart failure (HCC) 05/08/2021   Hypertension    HYPERTENSION 01/16/2009   Qualifier: Diagnosis of  By: Washington Hacker MD, Sean A    Hypokalemia 01/16/2009   Qualifier: Diagnosis of  By: Washington Hacker MD, Leota Randy   Formatting of this note might be different from the original. Overview:  Qualifier: Diagnosis of  By: Washington Hacker MD, Sean A   Left hip pain 05/24/2019   Lumbosacral spondylosis 10/09/2010   Lymphedema 02/10/2021   Medication management 08/25/2010   Memory loss 01/16/2009   Qualifier: Diagnosis of  By: Washington Hacker MD, Corbett Desanctis of this note  might be different from the original. Overview:  Qualifier: Diagnosis of  By: Washington Hacker MD, Sean A   Menopausal and postmenopausal disorder 01/16/2009   Qualifier: Diagnosis of  By: Washington Hacker MD, Leota Randy   Formatting of this note might be different from the original. Overview:  Qualifier: Diagnosis of  By: Washington Hacker MD, Sean A   Moderate aortic regurgitation 05/21/2019   Neuropathic pain 09/25/2020   Neuropathy    Obesity (BMI 30.0-34.9) 10/13/2021   Pain in soft tissues of limb 06/20/2012   Formatting of this note might be  different from the original. Overview:  IMPRESSION: Left knee pain Formatting of this note might be different from the original. IMPRESSION: Left knee pain   Pain in thoracic spine 07/30/2013   Palpitations 01/28/2017   Primary osteoarthritis of right knee 10/06/2014   Restless legs syndrome 08/25/2010   Shortness of breath on exertion 01/28/2017   SI (sacroiliac) pain 05/10/2016   Thoracic spondylosis 11/20/2010   Overview:  Overview:  IMPRESSION: Thoracic radiculopathy Overview:  IMPRESSION: Thoracic radiculopathy  Formatting of this note might be different from the original. Overview:  IMPRESSION: Thoracic radiculopathy Formatting of this note might be different from the original. IMPRESSION: Thoracic radiculopathy   Thrombocythemia, essential (HCC) 09/12/2013   Thyroid  disease    TMJ (dislocation of temporomandibular joint) 07/30/2013   Tremor 12/29/2021   Unspecified hypothyroidism 01/16/2009   Qualifier: Diagnosis of  By: Washington Hacker MD, Sean A    Uterine cancer (HCC) 07/30/2013    Past Surgical History:  Procedure Laterality Date   APPENDECTOMY     CERVICAL DISC SURGERY     CHOLECYSTECTOMY     FRACTURE SURGERY Left    Left arm has a metal plate   KNEE SURGERY Bilateral    TKR   ROBOTIC ASSISTED LAP VAGINAL HYSTERECTOMY     did not take ovaries. had uterine cancer.    Family History  Problem Relation Age of Onset   Deep vein thrombosis Father    Heart disease Father    Heart failure Father    Alzheimer's disease Father    Heart disease Brother    Pulmonary embolism Brother    Heart disease Brother     Social History   Socioeconomic History   Marital status: Married    Spouse name: Not on file   Number of children: Not on file   Years of education: Not on file   Highest education level: GED or equivalent  Occupational History   Not on file  Tobacco Use   Smoking status: Former   Smokeless tobacco: Never   Tobacco comments:    at age 23  Vaping Use   Vaping  status: Never Used  Substance and Sexual Activity   Alcohol use: No   Drug use: No   Sexual activity: Not on file  Other Topics Concern   Not on file  Social History Narrative   Not on file   Social Drivers of Health   Financial Resource Strain: Low Risk  (03/21/2023)   Overall Financial Resource Strain (CARDIA)    Difficulty of Paying Living Expenses: Not hard at all  Food Insecurity: No Food Insecurity (03/21/2023)   Hunger Vital Sign    Worried About Running Out of Food in the Last Year: Never true    Ran Out of Food in the Last Year: Never true  Transportation Needs: No Transportation Needs (03/21/2023)   PRAPARE - Administrator, Civil Service (Medical): No  Lack of Transportation (Non-Medical): No  Physical Activity: Inactive (03/21/2023)   Exercise Vital Sign    Days of Exercise per Week: 0 days    Minutes of Exercise per Session: 0 min  Stress: No Stress Concern Present (03/21/2023)   [employer] of Occupational Health - Occupational Stress Questionnaire    Feeling of Stress : Not at all  Social Connections: Socially Integrated (03/21/2023)   Social Connection and Isolation Panel [NHANES]    Frequency of Communication with Friends and Family: More than three times a week    Frequency of Social Gatherings with Friends and Family: Twice a week    Attends Religious Services: More than 4 times per year    Active Member of [employer] or Organizations: Yes    Attends Engineer, structural: More than 4 times per year    Marital Status: Married  Catering manager Violence: Not At Risk (03/21/2023)   Humiliation, Afraid, Rape, and Kick questionnaire    Fear of Current or Ex-Partner: No    Emotionally Abused: No    Physically Abused: No    Sexually Abused: No    Outpatient Medications Prior to Visit  Medication Sig Dispense Refill   albuterol  (VENTOLIN  HFA) 108 (90 Base) MCG/ACT inhaler Inhale 2 puffs into the lungs every 6 (six) hours as needed for  wheezing or shortness of breath.     aspirin 81 MG chewable tablet Chew 81 mg by mouth daily.     Biotin 10 MG TABS Take by mouth.     buprenorphine  (SUBUTEX ) 2 MG SUBL SL tablet Place 2 mg under the tongue daily.     buPROPion (WELLBUTRIN XL) 300 MG 24 hr tablet Take 1 tablet by mouth daily.     Calcium Carb-Cholecalciferol 500-10 MG-MCG TABS Take by mouth.     cloNIDine (CATAPRES) 0.1 MG tablet Take 1 tablet by mouth 2 (two) times daily.     diclofenac Sodium (VOLTAREN) 1 % GEL Apply 0.5 g topically 4 (four) times daily.     diltiazem (TIAZAC) 240 MG 24 hr capsule Take 240 mg by mouth daily.     donepezil  (ARICEPT ) 10 MG tablet Take 1 tablet (10 mg total) by mouth daily. 90 tablet 1   DULoxetine (CYMBALTA) 60 MG capsule Take 60 mg by mouth daily.     ergocalciferol (VITAMIN D2) 1.25 MG (50000 UT) capsule Take 50,000 Units by mouth once a week.     finasteride (PROSCAR) 5 MG tablet Take 5 mg by mouth daily.     fluticasone  (FLONASE ) 50 MCG/ACT nasal spray Place 1 spray into both nostrils daily as needed for allergies or rhinitis. 15.8 mL 1   furosemide  (LASIX ) 20 MG tablet Take 1 tablet (20 mg total) by mouth 2 (two) times daily. 180 tablet 1   iron polysaccharides (NIFEREX) 150 MG capsule Take 150 mg by mouth daily.     levothyroxine  (SYNTHROID ) 50 MCG tablet Take 1 tablet by mouth once daily 90 tablet 0   lidocaine (LIDODERM) 5 % Place onto the skin.     loratadine (CLARITIN) 10 MG tablet Take 10 mg by mouth daily.     losartan (COZAAR) 50 MG tablet Take 50 mg by mouth daily.     Misc Natural Products (FIBER 7 PO) Take by mouth.     montelukast (SINGULAIR) 10 MG tablet Take 10 mg by mouth at bedtime.     nitroGLYCERIN  (NITROSTAT ) 0.4 MG SL tablet Place 1 tablet (0.4 mg total) under the tongue as  needed for chest pain. 25 tablet 3   nystatin  cream (MYCOSTATIN ) Apply 1 Application topically 3 (three) times daily. 90 g 0   omeprazole  (PRILOSEC) 20 MG capsule Take 1 capsule (20 mg total) by  mouth daily. 90 capsule 1   oxybutynin (DITROPAN-XL) 10 MG 24 hr tablet Take 10 mg by mouth daily.     oxyCODONE-acetaminophen  (PERCOCET) 10-325 MG tablet Take 1 tablet by mouth every 4 (four) hours as needed for pain.     pregabalin (LYRICA) 75 MG capsule Take 150 mg by mouth at bedtime.     propranolol ER (INDERAL LA) 60 MG 24 hr capsule Take 60 mg by mouth daily.     rOPINIRole (REQUIP) 1 MG tablet Take 1 tablet by mouth daily.     Suvorexant (BELSOMRA) 15 MG TABS Take 1 tab at night     thiamine (VITAMIN B1) 100 MG tablet Take by mouth.     tiZANidine (ZANAFLEX) 4 MG tablet Take 4 mg by mouth 3 (three) times daily as needed for muscle spasms.     topiramate (TOPAMAX) 25 MG tablet Take 25 mg by mouth 2 (two) times daily.     traZODone (DESYREL) 100 MG tablet Take 100 mg by mouth at bedtime.     colchicine  0.6 MG tablet 2 DAILY FIRST DAY, THEN ONE DAILY FOR ONE WEEK. 8 tablet 1   No facility-administered medications prior to visit.    Allergies  Allergen Reactions   Cottonseed Oil Hives and Itching    Hives, Itching Raw Cotton    Codeine Sulfate     Review of Systems  Constitutional:  Negative for appetite change, fatigue and fever.  HENT:  Negative for congestion, ear pain, sinus pressure and sore throat.   Respiratory:  Negative for cough, chest tightness, shortness of breath and wheezing.   Cardiovascular:  Negative for chest pain and palpitations.  Gastrointestinal:  Negative for abdominal pain, constipation, diarrhea, nausea and vomiting.  Genitourinary:  Negative for dysuria and hematuria.  Musculoskeletal:  Positive for back pain. Negative for arthralgias, joint swelling and myalgias.  Skin:  Negative for rash.  Neurological:  Negative for dizziness, weakness and headaches.  Psychiatric/Behavioral:  Negative for dysphoric mood. The patient is not nervous/anxious.        Objective:        08/16/2023    2:31 PM 07/18/2023    2:33 PM 05/24/2023    2:28 PM  Vitals with  BMI  Height 5\' 4"  5\' 4"    Weight 219 lbs 220 lbs 3 oz   BMI 37.57 37.78   Systolic 132 128 191  Diastolic 68 70 62  Pulse 73 60     No data found.    Physical Exam Vitals reviewed.  Constitutional:      Appearance: Normal appearance.  Neck:     Vascular: No carotid bruit.  Cardiovascular:     Rate and Rhythm: Normal rate and regular rhythm.     Heart sounds: Normal heart sounds.  Pulmonary:     Effort: Pulmonary effort is normal.     Breath sounds: Normal breath sounds.  Abdominal:     General: Bowel sounds are normal.     Palpations: Abdomen is soft.     Tenderness: There is no abdominal tenderness.  Musculoskeletal:     Lumbar back: Spasms and tenderness present. No swelling or edema. Decreased range of motion.     Comments: Pain in muscle next to spine on left side more prominent than  right.  Neurological:     Mental Status: She is alert and oriented to person, place, and time.  Psychiatric:        Mood and Affect: Mood normal.        Behavior: Behavior normal.     Health Maintenance Due  Topic Date Due   Hepatitis C Screening  Never done   DEXA SCAN  Never done   COVID-19 Vaccine (5 - Moderna risk 2024-25 season) 08/04/2023    There are no preventive care reminders to display for this patient.   Lab Results  Component Value Date   TSH 1.870 02/03/2023   Lab Results  Component Value Date   WBC 9.6 05/24/2023   HGB 12.7 05/24/2023   HCT 39.1 05/24/2023   MCV 92 05/24/2023   PLT 354 05/24/2023   Lab Results  Component Value Date   NA 139 05/24/2023   K 4.6 05/24/2023   CO2 28 05/24/2023   GLUCOSE 99 05/24/2023   BUN 10 05/24/2023   CREATININE 0.89 05/24/2023   BILITOT 0.3 05/24/2023   ALKPHOS 91 05/24/2023   AST 22 05/24/2023   ALT 25 05/24/2023   PROT 6.9 05/24/2023   ALBUMIN 4.1 05/24/2023   CALCIUM 9.5 05/24/2023   EGFR 67 05/24/2023   Lab Results  Component Value Date   CHOL 209 (H) 02/03/2023   Lab Results  Component Value Date    HDL 44 02/03/2023   Lab Results  Component Value Date   LDLCALC 137 (H) 02/03/2023   Lab Results  Component Value Date   TRIG 155 (H) 02/03/2023   Lab Results  Component Value Date   CHOLHDL 4.8 (H) 02/03/2023   Lab Results  Component Value Date   HGBA1C 5.5 02/03/2023       Assessment & Plan:  Simple chronic bronchitis (HCC) Assessment & Plan: Improved Continue to monitor symptoms Will send for x-ray/CT if symptoms change   Obesity, morbid (HCC) Assessment & Plan: Continue to work on diet and exercise Continue to increase physical activity as much as possible without pain Is getting epidural placed in the next few weeks Will attempt to increase physical activity if epidural is affective.    Chronic heart failure with preserved ejection fraction [facility]) Assessment & Plan: Continue to follow up with cardiology Admits to continued lymphedema Continue conservative measures Will adjust treatment based on symptoms   Fibromyalgia Assessment & Plan: Long-standing fibromyalgia with severe flares, especially during weather changes. Symptoms include widespread pain. Previous diagnosis over ten years ago, no recent rheumatology evaluation. - Consider rheumatology referral if pain persists post-injection. - Discuss potential long-term steroid use if fibromyalgia confirmed as pain contributor.  Orders: -     predniSONE ; Take 3 tablets (60 mg total) by mouth daily with breakfast for 3 days, THEN 2 tablets (40 mg total) daily with breakfast for 3 days, THEN 1 tablet (20 mg total) daily with breakfast for 3 days.  Dispense: 18 tablet; Refill: 0  Closed fracture of one rib of left side, initial encounter Assessment & Plan: Rib fracture post-fall. No acute issues currently.    Continue working on deep breathing    Lymphedema Assessment & Plan: Chronic lymphedema contributing to gait instability.  Neuropathy in legs and feet Chronic neuropathy causing gait instability  and falls. Symptoms include difficulty walking. - Evaluate for neurologist referral if symptoms worsen.   Degeneration of intervertebral disc of lumbosacral region with discogenic back pain Assessment & Plan: Chronic severe back pain with history of  falls and rib fracture. Pain sharp, worsens with movement, sneezing, coughing. Previous MRI showed no new findings. Epidural injection planned, previous injections ineffective. Prednisone  taper provided temporary relief. - Administer epidural injection as scheduled. - Prescribe prednisone  taper for inflammation and pain until injection. - Consider rheumatology referral if pain persists post-injection.      Meds ordered this encounter  Medications   predniSONE  (DELTASONE ) 20 MG tablet    Sig: Take 3 tablets (60 mg total) by mouth daily with breakfast for 3 days, THEN 2 tablets (40 mg total) daily with breakfast for 3 days, THEN 1 tablet (20 mg total) daily with breakfast for 3 days.    Dispense:  18 tablet    Refill:  0    No orders of the defined types were placed in this encounter.     Follow-up: No follow-ups on file.  An After Visit Summary was printed and given to the patient.    I,Lauren M Auman,acting as a Neurosurgeon for [employer], [employer].,have documented all relevant documentation on the behalf of Odilia Bennett, PA,as directed by  Odilia Bennett, PA while in the presence of Odilia Bennett, [place].    Odilia Bennett, [place] [facility] [phone]

## 2023-08-22 DIAGNOSIS — S2239XA Fracture of one rib, unspecified side, initial encounter for closed fracture: Secondary | ICD-10-CM | POA: Insufficient documentation

## 2023-08-22 NOTE — Assessment & Plan Note (Addendum)
 Chronic lymphedema contributing to gait instability.  Neuropathy in legs and feet Chronic neuropathy causing gait instability and falls. Symptoms include difficulty walking. - Evaluate for neurologist referral if symptoms worsen.

## 2023-08-22 NOTE — Assessment & Plan Note (Signed)
 Chronic severe back pain with history of falls and rib fracture. Pain sharp, worsens with movement, sneezing, coughing. Previous MRI showed no new findings. Epidural injection planned, previous injections ineffective. Prednisone  taper provided temporary relief. - Administer epidural injection as scheduled. - Prescribe prednisone  taper for inflammation and pain until injection. - Consider rheumatology referral if pain persists post-injection.

## 2023-08-22 NOTE — Assessment & Plan Note (Signed)
 Improved Continue to monitor symptoms Will send for x-ray/CT if symptoms change

## 2023-08-22 NOTE — Assessment & Plan Note (Signed)
 Rib fracture post-fall. No acute issues currently.    Continue working on deep breathing

## 2023-08-22 NOTE — Assessment & Plan Note (Signed)
 Continue to follow up with cardiology Admits to continued lymphedema Continue conservative measures Will adjust treatment based on symptoms

## 2023-08-22 NOTE — Assessment & Plan Note (Signed)
 Continue to work on diet and exercise Continue to increase physical activity as much as possible without pain Is getting epidural placed in the next few weeks Will attempt to increase physical activity if epidural is affective.

## 2023-08-24 ENCOUNTER — Other Ambulatory Visit: Payer: Self-pay | Admitting: Family Medicine

## 2023-08-29 ENCOUNTER — Other Ambulatory Visit: Payer: Self-pay | Admitting: Family Medicine

## 2023-08-30 DIAGNOSIS — M5416 Radiculopathy, lumbar region: Secondary | ICD-10-CM | POA: Diagnosis not present

## 2023-09-01 ENCOUNTER — Encounter: Payer: Self-pay | Admitting: Physician Assistant

## 2023-09-01 ENCOUNTER — Ambulatory Visit (INDEPENDENT_AMBULATORY_CARE_PROVIDER_SITE_OTHER): Admitting: Physician Assistant

## 2023-09-01 VITALS — BP 138/72 | HR 76 | Temp 98.1°F | Ht 64.0 in | Wt 212.0 lb

## 2023-09-01 DIAGNOSIS — B079 Viral wart, unspecified: Secondary | ICD-10-CM | POA: Diagnosis not present

## 2023-09-01 DIAGNOSIS — M79644 Pain in right finger(s): Secondary | ICD-10-CM

## 2023-09-01 DIAGNOSIS — M797 Fibromyalgia: Secondary | ICD-10-CM

## 2023-09-01 DIAGNOSIS — G894 Chronic pain syndrome: Secondary | ICD-10-CM

## 2023-09-01 DIAGNOSIS — G8929 Other chronic pain: Secondary | ICD-10-CM | POA: Insufficient documentation

## 2023-09-01 DIAGNOSIS — L219 Seborrheic dermatitis, unspecified: Secondary | ICD-10-CM | POA: Diagnosis not present

## 2023-09-01 DIAGNOSIS — K529 Noninfective gastroenteritis and colitis, unspecified: Secondary | ICD-10-CM | POA: Diagnosis not present

## 2023-09-01 NOTE — Assessment & Plan Note (Signed)
 Would like referral to Rheumatologist  Wants to look more int fibromyalgia and possible rheumatoid arthritis along with other auto immune issues

## 2023-09-01 NOTE — Assessment & Plan Note (Signed)
 Referral sent to rheumatology Continue to follow up as needed

## 2023-09-01 NOTE — Assessment & Plan Note (Signed)
 Severe hand pain and dysfunction, especially in thumb area. Possible arthritis or overuse injury. Referral to orthopedic specialist and consideration of rheumatology referral for underlying condition. - Refer to orthopedic clinic for evaluation and possible injection therapy. - Refer to rheumatologist for evaluation of potential rheumatologic condition.

## 2023-09-01 NOTE — Progress Notes (Signed)
 Acute Office Visit  Subjective:    Patient ID: Lauren Roth, female    DOB: February 22, 1946, 78 y.o.   MRN: [id number]  Chief Complaint  Patient presents with   Abdominal Pain    HPI: Patient is in today for abdominal issues, states she feels bloated, diarrhea and constipation. States this has been an issue for some times now. Omeprazole  20 mg daily.  Discussed the use of AI scribe software for clinical note transcription with the patient, who gave verbal consent to proceed.  History of Present Illness   Lauren Roth is a 78 year old female with a history of intestinal infection who presents with worsening diarrhea.  She experiences frequent bowel movements and excessive gas, alternating between diarrhea and constipation, which she attributes to her medication use. She has a history of intestinal infection several years ago that required emergency room treatment and a month-long course of antibiotics. She is concerned about possibly passing the condition back and forth with her husband, who also experiences frequent diarrhea. She has not observed any blood in her stool but has noticed mucus following episodes of constipation. She manages her symptoms with Metamucil and a high-fiber diet, but finds the current situation abnormal and distressing.  She has a history of back pain for which she recently received an epidural injection, providing temporary relief. She continues to experience pain and anticipates another epidural in the coming weeks.  She describes severe pain and difficulty using her hand, particularly around the thumb, suspecting arthritis due to a history of overuse. She has not seen a specialist for this issue but is interested in further evaluation.  She has a history of congestive heart failure and is currently on Lasix , taking one in the morning and one at night. She experiences episodes of fluid retention that require increased Lasix  dosage to manage  at home, preventing hospital visits. She uses pumps and leg compression devices to manage her condition.  She reports a dry mouth, which she manages by staying hydrated and using sugar-free gum and candy. She also consumes sugar-free Gatorade to maintain electrolyte balance, especially given her frequent diarrhea.  Her husband is currently facing health challenges, including potential heart surgery, and she notes his decreased activity level and frequent need for rest.       Past Medical History:  Diagnosis Date   Allergic rhinitis 01/16/2009   Qualifier: Diagnosis of  By: Washington Hacker MD, Corbett Desanctis of this note might be different from the original. Overview:  Qualifier: Diagnosis of  By: Washington Hacker MD, Kaaren Ora A   Arthritis    Chronic migraine w/o aura, not intractable, w/o stat migr    Chronic pain of right knee 06/12/2021   Chronic pain syndrome 07/30/2013   DEGENERATIVE DISC DISEASE, LUMBOSACRAL SPINE 01/16/2009   Qualifier: Diagnosis of  By: Washington Hacker MD, Kaaren Ora A    Depression    DIAPHORESIS 01/16/2009   Qualifier: Diagnosis of  By: Washington Hacker MD, Sean A    Edema 01/16/2009   Qualifier: Diagnosis of  By: Washington Hacker MD, Leota Randy   Formatting of this note might be different from the original. Overview:  Qualifier: Diagnosis of  By: Washington Hacker MD, Sean A   Essential hypertension 01/28/2017   Essential thrombocytosis (HCC) 11/25/2017   Fibromyalgia    GERD 01/16/2009   Qualifier: Diagnosis of  By: Washington Hacker MD, Sean A    Heart failure (HCC) 05/08/2021   Hypertension    HYPERTENSION 01/16/2009   Qualifier:  Diagnosis of  By: Washington Hacker MD, Kaaren Ora A    Hypokalemia 01/16/2009   Qualifier: Diagnosis of  By: Washington Hacker MD, Corbett Desanctis of this note might be different from the original. Overview:  Qualifier: Diagnosis of  By: Washington Hacker MD, Sean A   Left hip pain 05/24/2019   Lumbosacral spondylosis 10/09/2010   Lymphedema 02/10/2021   Medication management 08/25/2010   Memory loss 01/16/2009    Qualifier: Diagnosis of  By: Washington Hacker MD, Leota Randy   Formatting of this note might be different from the original. Overview:  Qualifier: Diagnosis of  By: Washington Hacker MD, Sean A   Menopausal and postmenopausal disorder 01/16/2009   Qualifier: Diagnosis of  By: Washington Hacker MD, Leota Randy   Formatting of this note might be different from the original. Overview:  Qualifier: Diagnosis of  By: Washington Hacker MD, Sean A   Moderate aortic regurgitation 05/21/2019   Neuropathic pain 09/25/2020   Neuropathy    Obesity (BMI 30.0-34.9) 10/13/2021   Pain in soft tissues of limb 06/20/2012   Formatting of this note might be different from the original. Overview:  IMPRESSION: Left knee pain Formatting of this note might be different from the original. IMPRESSION: Left knee pain   Pain in thoracic spine 07/30/2013   Palpitations 01/28/2017   Primary osteoarthritis of right knee 10/06/2014   Restless legs syndrome 08/25/2010   Shortness of breath on exertion 01/28/2017   SI (sacroiliac) pain 05/10/2016   Thoracic spondylosis 11/20/2010   Overview:  Overview:  IMPRESSION: Thoracic radiculopathy Overview:  IMPRESSION: Thoracic radiculopathy  Formatting of this note might be different from the original. Overview:  IMPRESSION: Thoracic radiculopathy Formatting of this note might be different from the original. IMPRESSION: Thoracic radiculopathy   Thrombocythemia, essential (HCC) 09/12/2013   Thyroid  disease    TMJ (dislocation of temporomandibular joint) 07/30/2013   Tremor 12/29/2021   Unspecified hypothyroidism 01/16/2009   Qualifier: Diagnosis of  By: Washington Hacker MD, Sean A    Uterine cancer (HCC) 07/30/2013    Past Surgical History:  Procedure Laterality Date   APPENDECTOMY     CERVICAL DISC SURGERY     CHOLECYSTECTOMY     FRACTURE SURGERY Left    Left arm has a metal plate   KNEE SURGERY Bilateral    TKR   ROBOTIC ASSISTED LAP VAGINAL HYSTERECTOMY     did not take ovaries. had uterine cancer.    Family History   Problem Relation Age of Onset   Deep vein thrombosis Father    Heart disease Father    Heart failure Father    Alzheimer's disease Father    Heart disease Brother    Pulmonary embolism Brother    Heart disease Brother     Social History   Socioeconomic History   Marital status: Married    Spouse name: Not on file   Number of children: Not on file   Years of education: Not on file   Highest education level: GED or equivalent  Occupational History   Not on file  Tobacco Use   Smoking status: Former   Smokeless tobacco: Never   Tobacco comments:    at age 55  Vaping Use   Vaping status: Never Used  Substance and Sexual Activity   Alcohol use: No   Drug use: No   Sexual activity: Not on file  Other Topics Concern   Not on file  Social History Narrative   Not on file   Social Drivers of Health  Financial Resource Strain: Low Risk  (03/21/2023)   Overall Financial Resource Strain (CARDIA)    Difficulty of Paying Living Expenses: Not hard at all  Food Insecurity: No Food Insecurity (03/21/2023)   Hunger Vital Sign    Worried About Running Out of Food in the Last Year: Never true    Ran Out of Food in the Last Year: Never true  Transportation Needs: No Transportation Needs (03/21/2023)   PRAPARE - Administrator, Civil Service (Medical): No    Lack of Transportation (Non-Medical): No  Physical Activity: Inactive (03/21/2023)   Exercise Vital Sign    Days of Exercise per Week: 0 days    Minutes of Exercise per Session: 0 min  Stress: No Stress Concern Present (03/21/2023)   [employer] of Occupational Health - Occupational Stress Questionnaire    Feeling of Stress : Not at all  Social Connections: Socially Integrated (03/21/2023)   Social Connection and Isolation Panel [NHANES]    Frequency of Communication with Friends and Family: More than three times a week    Frequency of Social Gatherings with Friends and Family: Twice a week    Attends Religious  Services: More than 4 times per year    Active Member of [employer] or Organizations: Yes    Attends Engineer, structural: More than 4 times per year    Marital Status: Married  Catering manager Violence: Not At Risk (03/21/2023)   Humiliation, Afraid, Rape, and Kick questionnaire    Fear of Current or Ex-Partner: No    Emotionally Abused: No    Physically Abused: No    Sexually Abused: No    Outpatient Medications Prior to Visit  Medication Sig Dispense Refill   albuterol  (VENTOLIN  HFA) 108 (90 Base) MCG/ACT inhaler Inhale 2 puffs into the lungs every 6 (six) hours as needed for wheezing or shortness of breath.     aspirin 81 MG chewable tablet Chew 81 mg by mouth daily.     Biotin 10 MG TABS Take by mouth.     buprenorphine  (SUBUTEX ) 2 MG SUBL SL tablet Place 2 mg under the tongue daily.     buPROPion (WELLBUTRIN XL) 300 MG 24 hr tablet Take 1 tablet by mouth daily.     Calcium Carb-Cholecalciferol 500-10 MG-MCG TABS Take by mouth.     cloNIDine (CATAPRES) 0.1 MG tablet Take 1 tablet by mouth 2 (two) times daily.     diclofenac Sodium (VOLTAREN) 1 % GEL Apply 0.5 g topically 4 (four) times daily.     diltiazem (TIAZAC) 240 MG 24 hr capsule Take 240 mg by mouth daily.     donepezil  (ARICEPT ) 10 MG tablet Take 1 tablet by mouth once daily 90 tablet 0   DULoxetine (CYMBALTA) 60 MG capsule Take 60 mg by mouth daily.     ergocalciferol (VITAMIN D2) 1.25 MG (50000 UT) capsule Take 50,000 Units by mouth once a week.     finasteride (PROSCAR) 5 MG tablet Take 5 mg by mouth daily.     fluticasone  (FLONASE ) 50 MCG/ACT nasal spray Place 1 spray into both nostrils daily as needed for allergies or rhinitis. 15.8 mL 1   furosemide  (LASIX ) 20 MG tablet Take 1 tablet by mouth twice daily 180 tablet 0   iron polysaccharides (NIFEREX) 150 MG capsule Take 150 mg by mouth daily.     levothyroxine  (SYNTHROID ) 50 MCG tablet Take 1 tablet by mouth once daily 90 tablet 0   lidocaine (LIDODERM) 5 % Place  onto the skin.     loratadine (CLARITIN) 10 MG tablet Take 10 mg by mouth daily.     losartan (COZAAR) 50 MG tablet Take 50 mg by mouth daily.     Misc Natural Products (FIBER 7 PO) Take by mouth.     montelukast (SINGULAIR) 10 MG tablet Take 10 mg by mouth at bedtime.     nitroGLYCERIN  (NITROSTAT ) 0.4 MG SL tablet Place 1 tablet (0.4 mg total) under the tongue as needed for chest pain. 25 tablet 3   omeprazole  (PRILOSEC) 20 MG capsule Take 1 capsule (20 mg total) by mouth daily. 90 capsule 1   oxybutynin (DITROPAN-XL) 10 MG 24 hr tablet Take 10 mg by mouth daily.     oxyCODONE-acetaminophen  (PERCOCET) 10-325 MG tablet Take 1 tablet by mouth every 4 (four) hours as needed for pain.     pregabalin (LYRICA) 75 MG capsule Take 150 mg by mouth at bedtime.     propranolol ER (INDERAL LA) 60 MG 24 hr capsule Take 60 mg by mouth daily.     rOPINIRole (REQUIP) 1 MG tablet Take 1 tablet by mouth daily.     Suvorexant (BELSOMRA) 15 MG TABS Take 1 tab at night     thiamine (VITAMIN B1) 100 MG tablet Take by mouth.     tiZANidine (ZANAFLEX) 4 MG tablet Take 4 mg by mouth 3 (three) times daily as needed for muscle spasms.     topiramate (TOPAMAX) 25 MG tablet Take 25 mg by mouth 2 (two) times daily.     traZODone (DESYREL) 100 MG tablet Take 100 mg by mouth at bedtime.     No facility-administered medications prior to visit.    Allergies  Allergen Reactions   Cottonseed Oil Hives and Itching    Hives, Itching Raw Cotton    Codeine Sulfate     Review of Systems  Constitutional:  Negative for appetite change, fatigue and fever.  Gastrointestinal:  Positive for constipation and diarrhea. Negative for abdominal pain, nausea and vomiting.       Bloated        Objective:         09/01/2023    9:43 AM 08/16/2023    2:31 PM 07/18/2023    2:33 PM  Vitals with BMI  Height 5\' 4"  5\' 4"  5\' 4"   Weight 212 lbs 219 lbs 220 lbs 3 oz  BMI 36.37 37.57 37.78  Systolic 138 132 865  Diastolic 72 68 70   Pulse 76 73 60    No data found.   Physical Exam Vitals reviewed.  Constitutional:      Appearance: Normal appearance.  Neck:     Vascular: No carotid bruit.  Cardiovascular:     Rate and Rhythm: Normal rate and regular rhythm.     Heart sounds: Normal heart sounds.  Pulmonary:     Effort: Pulmonary effort is normal.     Breath sounds: Normal breath sounds.  Abdominal:     General: Bowel sounds are normal.     Palpations: Abdomen is soft.     Tenderness: There is no abdominal tenderness.  Musculoskeletal:     Right hand: Swelling and tenderness present. No bony tenderness. Normal range of motion.     Left hand: Normal.  Neurological:     Mental Status: She is alert and oriented to person, place, and time.  Psychiatric:        Mood and Affect: Mood normal.  Behavior: Behavior normal.     Health Maintenance Due  Topic Date Due   Hepatitis C Screening  Never done   DEXA SCAN  Never done   COVID-19 Vaccine (5 - Moderna risk 2024-25 season) 08/04/2023    There are no preventive care reminders to display for this patient.   Lab Results  Component Value Date   TSH 1.870 02/03/2023   Lab Results  Component Value Date   WBC 9.6 05/24/2023   HGB 12.7 05/24/2023   HCT 39.1 05/24/2023   MCV 92 05/24/2023   PLT 354 05/24/2023   Lab Results  Component Value Date   NA 139 05/24/2023   K 4.6 05/24/2023   CO2 28 05/24/2023   GLUCOSE 99 05/24/2023   BUN 10 05/24/2023   CREATININE 0.89 05/24/2023   BILITOT 0.3 05/24/2023   ALKPHOS 91 05/24/2023   AST 22 05/24/2023   ALT 25 05/24/2023   PROT 6.9 05/24/2023   ALBUMIN 4.1 05/24/2023   CALCIUM 9.5 05/24/2023   EGFR 67 05/24/2023   Lab Results  Component Value Date   CHOL 209 (H) 02/03/2023   Lab Results  Component Value Date   HDL 44 02/03/2023   Lab Results  Component Value Date   LDLCALC 137 (H) 02/03/2023   Lab Results  Component Value Date   TRIG 155 (H) 02/03/2023   Lab Results  Component  Value Date   CHOLHDL 4.8 (H) 02/03/2023   Lab Results  Component Value Date   HGBA1C 5.5 02/03/2023       Assessment & Plan:  Chronic diarrhea Assessment & Plan: Chronic diarrhea with alternating constipation, possibly due to gastrointestinal issue or infection. History of treated intestinal infection. Concern for transmission between her and spouse. Advised hydration with electrolytes to prevent dehydration. - Order stool study for infection. - Advise increased hydration with electrolytes, e.g., sugar-free Gatorade. - Recommend probiotics. - Consider abdominal imaging if stool study negative.  Orders: -     GI Profile, Stool, PCR  Chronic pain syndrome Assessment & Plan: Would like referral to Rheumatologist  Wants to look more int fibromyalgia and possible rheumatoid arthritis along with other auto immune issues  Orders: -     Ambulatory referral to Rheumatology  Fibromyalgia Assessment & Plan: Referral sent to rheumatology Continue to follow up as needed  Orders: -     Ambulatory referral to Rheumatology  Chronic thumb pain, right Assessment & Plan: Severe hand pain and dysfunction, especially in thumb area. Possible arthritis or overuse injury. Referral to orthopedic specialist and consideration of rheumatology referral for underlying condition. - Refer to orthopedic clinic for evaluation and possible injection therapy. - Refer to rheumatologist for evaluation of potential rheumatologic condition.  Orders: -     Ambulatory referral to Orthopedic Surgery      No orders of the defined types were placed in this encounter.   Orders Placed This Encounter  Procedures   GI Profile, Stool, PCR   Ambulatory referral to Rheumatology   Ambulatory referral to Orthopedic Surgery     Follow-up: No follow-ups on file.  An After Visit Summary was printed and given to the patient.   I,Lauren M Auman,acting as a Neurosurgeon for [employer], [employer].,have documented all  relevant documentation on the behalf of Odilia Bennett, PA,as directed by  Odilia Bennett, PA while in the presence of Odilia Bennett, [place].    Odilia Bennett, [place] [facility] [phone]

## 2023-09-01 NOTE — Assessment & Plan Note (Signed)
 Chronic diarrhea with alternating constipation, possibly due to gastrointestinal issue or infection. History of treated intestinal infection. Concern for transmission between her and spouse. Advised hydration with electrolytes to prevent dehydration. - Order stool study for infection. - Advise increased hydration with electrolytes, e.g., sugar-free Gatorade. - Recommend probiotics. - Consider abdominal imaging if stool study negative.

## 2023-09-04 DIAGNOSIS — G8929 Other chronic pain: Secondary | ICD-10-CM | POA: Diagnosis not present

## 2023-09-04 DIAGNOSIS — Z79899 Other long term (current) drug therapy: Secondary | ICD-10-CM | POA: Diagnosis not present

## 2023-09-04 DIAGNOSIS — M549 Dorsalgia, unspecified: Secondary | ICD-10-CM | POA: Diagnosis not present

## 2023-09-04 DIAGNOSIS — M545 Low back pain, unspecified: Secondary | ICD-10-CM | POA: Diagnosis not present

## 2023-09-07 ENCOUNTER — Ambulatory Visit: Payer: Self-pay | Admitting: Family Medicine

## 2023-09-07 NOTE — Telephone Encounter (Signed)
 Chronic right side back pain, worsening  Frequency: Intermittent Pertinent Negatives: Patient denies urination pain, fever  Disposition: [x] Appointment (In office)  Additional Notes: Patient states she was seen in ED two nights ago for this back pain. Pt scheduled for an appointment tomorrow in office. This RN educated pt on new-worsening symptoms and when to call back/seek emergent care. Pt verbalized understanding and agrees to plan.   Copied from CRM [id number]. Topic: Clinical - Red Word Triage >> Sep 07, 2023 10:48 AM Ivette P wrote: [facility] that prompted transfer to Nurse Triage: pain right side stomach - 4 days ago, gradually getting worse Reason for Disposition  Back pain is a chronic symptom (recurrent or ongoing AND present > 4 weeks)  Answer Assessment - Initial Assessment Questions Chronic right side back pain, worsening  Frequency: Intermittent  Pertinent Negatives: Patient denies urination pain, fever  Protocols used: Back Pain-A-AH

## 2023-09-08 ENCOUNTER — Ambulatory Visit: Admitting: Family Medicine

## 2023-09-08 DIAGNOSIS — L97522 Non-pressure chronic ulcer of other part of left foot with fat layer exposed: Secondary | ICD-10-CM | POA: Diagnosis not present

## 2023-09-08 DIAGNOSIS — E119 Type 2 diabetes mellitus without complications: Secondary | ICD-10-CM | POA: Insufficient documentation

## 2023-09-08 DIAGNOSIS — E11621 Type 2 diabetes mellitus with foot ulcer: Secondary | ICD-10-CM | POA: Diagnosis not present

## 2023-09-08 DIAGNOSIS — L03032 Cellulitis of left toe: Secondary | ICD-10-CM | POA: Insufficient documentation

## 2023-09-16 ENCOUNTER — Other Ambulatory Visit: Payer: Self-pay | Admitting: Family Medicine

## 2023-09-16 DIAGNOSIS — E039 Hypothyroidism, unspecified: Secondary | ICD-10-CM

## 2023-09-19 DIAGNOSIS — M189 Osteoarthritis of first carpometacarpal joint, unspecified: Secondary | ICD-10-CM | POA: Insufficient documentation

## 2023-09-19 DIAGNOSIS — M180 Bilateral primary osteoarthritis of first carpometacarpal joints: Secondary | ICD-10-CM | POA: Diagnosis not present

## 2023-09-19 DIAGNOSIS — M79644 Pain in right finger(s): Secondary | ICD-10-CM | POA: Diagnosis not present

## 2023-09-19 DIAGNOSIS — M79645 Pain in left finger(s): Secondary | ICD-10-CM | POA: Diagnosis not present

## 2023-09-22 DIAGNOSIS — L97522 Non-pressure chronic ulcer of other part of left foot with fat layer exposed: Secondary | ICD-10-CM | POA: Diagnosis not present

## 2023-09-22 DIAGNOSIS — L03032 Cellulitis of left toe: Secondary | ICD-10-CM | POA: Diagnosis not present

## 2023-09-22 DIAGNOSIS — E119 Type 2 diabetes mellitus without complications: Secondary | ICD-10-CM | POA: Diagnosis not present

## 2023-10-07 ENCOUNTER — Ambulatory Visit: Payer: Self-pay

## 2023-10-07 NOTE — Telephone Encounter (Signed)
 FYI Only or Action Required?: Action required by provider: clinical question for provider.  Patient was last seen in primary care on 09/01/2023 by Odilia Bennett, PA. Called Nurse Triage reporting Leg Swelling. Symptoms began about a month ago. Interventions attempted: Prescription medications: Lasix , Rest, hydration, or home remedies, and Ice/heat application. Symptoms are: gradually worsening.  Triage Disposition: See Physician Within 24 Hours- No avail appt within 24 hours. Pt refusing ED or UC. (See FYI) below. Went ahead and scheduled for 6/23  Patient/caregiver understands and will follow disposition?: No, wishes to speak with PCP      Copied from CRM [id number]. Topic: Clinical - Red Word Triage >> Oct 07, 2023  1:34 PM Fonda T wrote: [facility] that prompted transfer to Nurse Triage: Patient's daughter, Avanell Bob, calling requesting to report Increased extreme leg swelling, with weeping on behalf of mother.   FYI- Patient asking for increase regimen of Lasix . Says she feels like the Lasix  20mg  BID is not effective. Pt says that she was previously taking Lasix  4omg BID.   Reason for Disposition  [1] MODERATE leg swelling (e.g., swelling extends up to knees) AND [2] new-onset or worsening  [1] Red or very tender (to touch) area AND [2] started over 24 hours after the bite  Answer Assessment - Initial Assessment Questions 1. ONSET: When did the swelling start? (e.g., minutes, hours, days)     Going on off/on for the past month. Has been on feet a lot more taking care of her husband.   2. LOCATION: What part of the leg is swollen?  Are both legs swollen or just one leg?     Both legs, calves and feet.   3. SEVERITY: How bad is the swelling? (e.g., localized; mild, moderate, severe)   - Localized: Small area of swelling localized to one leg.   - MILD pedal edema: Swelling limited to foot and ankle, pitting edema < 1/4 inch (6 mm) deep, rest and elevation eliminate most or all  swelling.   - MODERATE edema: Swelling of lower leg to knee, pitting edema > 1/4 inch (6 mm) deep, rest and elevation only partially reduce swelling.   - SEVERE edema: Swelling extends above knee, facial or hand swelling present.      Cannot tell if its getting better, feels like its swelling consistent  4. REDNESS: Does the swelling look red or infected?     No, just gets red occasionally.   5. PAIN: Is the swelling painful to touch? If Yes, ask: How painful is it?   (Scale 1-10; mild, moderate or severe)     No pain  6. FEVER: Do you have a fever? If Yes, ask: What is it, how was it measured, and when did it start?      No   7. CAUSE: What do you think is causing the leg swelling?     Pt says that she doesn't believe she is taking enough lasix   8. MEDICAL HISTORY: Do you have a history of blood clots (e.g., DVT), cancer, heart failure, kidney disease, or liver failure?     Fluid overload, DVT (post op)  9. RECURRENT SYMPTOM: Have you had leg swelling before? If Yes, ask: When was the last time? What happened that time?     Yes, has history of lymphedema, taking Lasix   10. OTHER SYMPTOMS: Do you have any other symptoms? (e.g., chest pain, difficulty breathing)       Body aches  Patient's primary concern is that she is  not taking enough lasix .    11. PREGNANCY: Is there any chance you are pregnant? When was your last menstrual period?       No  Answer Assessment - Initial Assessment Questions 1. ATTACHED:  Is the tick still on the skin?  (e.g., yes, no, unsure)     No- 2. ONSET - TICK STILL ATTACHED:  How long do you think the tick has been on your skin? (e.g., hours, days, unsure)  Note:  Is there a recent activity (camping, hiking) where the caller may have been exposed?     No, removed with tweezers  3. ONSET - TICK NOT STILL ATTACHED: If the tick has been removed, how long do you think the tick was attached before you removed it? (e.g., 5  hours, 2 days). When was this?     No  4. LOCATION: Where is the tick bite located? (e.g., arm, leg)     Right leg  5. TYPE of TICK: Is it a wood tick or a deer tick? (e.g., deer tick, wood tick; unsure)     Unsure  6. SIZE of TICK: How big is the tick? (e.g., size of poppy seed, apple seed, watermelon seed; unsure) Note: Deer ticks can be the size of a poppy seed (nymph) or an apple seed (adult).       Tiny   7. ENGORGED: Did the tick look flat or engorged (full, swollen)? (e.g., flat, engorged; unsure)     Swollen  8. OTHER SYMPTOMS: Do you have any other symptoms? (e.g., fever, rash, redness at bite area, red ring around bite)     Redness at bite area  9. PREGNANCY: Is there any chance you are pregnant? When was your last menstrual period?     No  Protocols used: Leg Swelling and Edema-A-AH, Tick Bite-A-AH

## 2023-10-10 ENCOUNTER — Emergency Department (HOSPITAL_COMMUNITY)

## 2023-10-10 ENCOUNTER — Encounter (HOSPITAL_COMMUNITY): Payer: Self-pay | Admitting: Emergency Medicine

## 2023-10-10 ENCOUNTER — Ambulatory Visit: Admitting: Family Medicine

## 2023-10-10 ENCOUNTER — Inpatient Hospital Stay (HOSPITAL_COMMUNITY)
Admission: EM | Admit: 2023-10-10 | Discharge: 2023-10-13 | DRG: 543 | Disposition: A | Discharge status: Skilled Nursing Facility | Attending: Family Medicine | Admitting: Family Medicine

## 2023-10-10 ENCOUNTER — Other Ambulatory Visit: Payer: Self-pay

## 2023-10-10 DIAGNOSIS — M533 Sacrococcygeal disorders, not elsewhere classified: Secondary | ICD-10-CM | POA: Diagnosis not present

## 2023-10-10 DIAGNOSIS — E876 Hypokalemia: Secondary | ICD-10-CM | POA: Diagnosis present

## 2023-10-10 DIAGNOSIS — Z79899 Other long term (current) drug therapy: Secondary | ICD-10-CM | POA: Diagnosis not present

## 2023-10-10 DIAGNOSIS — G629 Polyneuropathy, unspecified: Secondary | ICD-10-CM | POA: Diagnosis present

## 2023-10-10 DIAGNOSIS — M4856XA Collapsed vertebra, not elsewhere classified, lumbar region, initial encounter for fracture: Secondary | ICD-10-CM | POA: Diagnosis not present

## 2023-10-10 DIAGNOSIS — I351 Nonrheumatic aortic (valve) insufficiency: Secondary | ICD-10-CM | POA: Diagnosis present

## 2023-10-10 DIAGNOSIS — M47816 Spondylosis without myelopathy or radiculopathy, lumbar region: Secondary | ICD-10-CM | POA: Diagnosis not present

## 2023-10-10 DIAGNOSIS — Z90710 Acquired absence of both cervix and uterus: Secondary | ICD-10-CM

## 2023-10-10 DIAGNOSIS — I10 Essential (primary) hypertension: Secondary | ICD-10-CM | POA: Diagnosis present

## 2023-10-10 DIAGNOSIS — G2581 Restless legs syndrome: Secondary | ICD-10-CM | POA: Diagnosis present

## 2023-10-10 DIAGNOSIS — I110 Hypertensive heart disease with heart failure: Secondary | ICD-10-CM | POA: Diagnosis present

## 2023-10-10 DIAGNOSIS — Z6836 Body mass index (BMI) 36.0-36.9, adult: Secondary | ICD-10-CM

## 2023-10-10 DIAGNOSIS — M48061 Spinal stenosis, lumbar region without neurogenic claudication: Secondary | ICD-10-CM | POA: Diagnosis not present

## 2023-10-10 DIAGNOSIS — M546 Pain in thoracic spine: Secondary | ICD-10-CM | POA: Diagnosis not present

## 2023-10-10 DIAGNOSIS — S32020A Wedge compression fracture of second lumbar vertebra, initial encounter for closed fracture: Secondary | ICD-10-CM | POA: Diagnosis not present

## 2023-10-10 DIAGNOSIS — E162 Hypoglycemia, unspecified: Secondary | ICD-10-CM | POA: Diagnosis not present

## 2023-10-10 DIAGNOSIS — Z951 Presence of aortocoronary bypass graft: Secondary | ICD-10-CM

## 2023-10-10 DIAGNOSIS — Z820 Family history of epilepsy and other diseases of the nervous system: Secondary | ICD-10-CM

## 2023-10-10 DIAGNOSIS — G894 Chronic pain syndrome: Secondary | ICD-10-CM | POA: Diagnosis present

## 2023-10-10 DIAGNOSIS — F32A Depression, unspecified: Secondary | ICD-10-CM | POA: Diagnosis present

## 2023-10-10 DIAGNOSIS — Z885 Allergy status to narcotic agent status: Secondary | ICD-10-CM

## 2023-10-10 DIAGNOSIS — M797 Fibromyalgia: Secondary | ICD-10-CM | POA: Diagnosis present

## 2023-10-10 DIAGNOSIS — Z8249 Family history of ischemic heart disease and other diseases of the circulatory system: Secondary | ICD-10-CM

## 2023-10-10 DIAGNOSIS — E669 Obesity, unspecified: Secondary | ICD-10-CM | POA: Diagnosis present

## 2023-10-10 DIAGNOSIS — R1031 Right lower quadrant pain: Secondary | ICD-10-CM | POA: Diagnosis not present

## 2023-10-10 DIAGNOSIS — R413 Other amnesia: Secondary | ICD-10-CM | POA: Diagnosis present

## 2023-10-10 DIAGNOSIS — M961 Postlaminectomy syndrome, not elsewhere classified: Secondary | ICD-10-CM | POA: Diagnosis not present

## 2023-10-10 DIAGNOSIS — Z96653 Presence of artificial knee joint, bilateral: Secondary | ICD-10-CM | POA: Diagnosis present

## 2023-10-10 DIAGNOSIS — Z9049 Acquired absence of other specified parts of digestive tract: Secondary | ICD-10-CM

## 2023-10-10 DIAGNOSIS — K219 Gastro-esophageal reflux disease without esophagitis: Secondary | ICD-10-CM | POA: Diagnosis present

## 2023-10-10 DIAGNOSIS — Z87891 Personal history of nicotine dependence: Secondary | ICD-10-CM

## 2023-10-10 DIAGNOSIS — Z751 Person awaiting admission to adequate facility elsewhere: Secondary | ICD-10-CM

## 2023-10-10 DIAGNOSIS — E039 Hypothyroidism, unspecified: Secondary | ICD-10-CM | POA: Diagnosis present

## 2023-10-10 DIAGNOSIS — S32020D Wedge compression fracture of second lumbar vertebra, subsequent encounter for fracture with routine healing: Secondary | ICD-10-CM | POA: Diagnosis not present

## 2023-10-10 DIAGNOSIS — M549 Dorsalgia, unspecified: Secondary | ICD-10-CM | POA: Diagnosis not present

## 2023-10-10 DIAGNOSIS — M545 Low back pain, unspecified: Secondary | ICD-10-CM | POA: Diagnosis not present

## 2023-10-10 DIAGNOSIS — I5032 Chronic diastolic (congestive) heart failure: Secondary | ICD-10-CM | POA: Diagnosis present

## 2023-10-10 DIAGNOSIS — Z8542 Personal history of malignant neoplasm of other parts of uterus: Secondary | ICD-10-CM

## 2023-10-10 DIAGNOSIS — M51379 Other intervertebral disc degeneration, lumbosacral region without mention of lumbar back pain or lower extremity pain: Secondary | ICD-10-CM | POA: Diagnosis present

## 2023-10-10 DIAGNOSIS — Z79890 Hormone replacement therapy (postmenopausal): Secondary | ICD-10-CM

## 2023-10-10 DIAGNOSIS — M5136 Other intervertebral disc degeneration, lumbar region with discogenic back pain only: Secondary | ICD-10-CM | POA: Diagnosis not present

## 2023-10-10 DIAGNOSIS — Z7982 Long term (current) use of aspirin: Secondary | ICD-10-CM

## 2023-10-10 DIAGNOSIS — Z91018 Allergy to other foods: Secondary | ICD-10-CM

## 2023-10-10 DIAGNOSIS — M25512 Pain in left shoulder: Secondary | ICD-10-CM | POA: Diagnosis present

## 2023-10-10 DIAGNOSIS — I890 Lymphedema, not elsewhere classified: Secondary | ICD-10-CM | POA: Diagnosis present

## 2023-10-10 LAB — CBC WITH DIFFERENTIAL/PLATELET
Abs Immature Granulocytes: 0.01 10*3/uL (ref 0.00–0.07)
Basophils Absolute: 0 10*3/uL (ref 0.0–0.1)
Basophils Relative: 0 %
Eosinophils Absolute: 0.2 10*3/uL (ref 0.0–0.5)
Eosinophils Relative: 3 %
HCT: 37.1 % (ref 36.0–46.0)
Hemoglobin: 12.2 g/dL (ref 12.0–15.0)
Immature Granulocytes: 0 %
Lymphocytes Relative: 35 %
Lymphs Abs: 2.4 10*3/uL (ref 0.7–4.0)
MCH: 29.9 pg (ref 26.0–34.0)
MCHC: 32.9 g/dL (ref 30.0–36.0)
MCV: 90.9 fL (ref 80.0–100.0)
Monocytes Absolute: 0.7 10*3/uL (ref 0.1–1.0)
Monocytes Relative: 11 %
Neutro Abs: 3.4 10*3/uL (ref 1.7–7.7)
Neutrophils Relative %: 51 %
Platelets: 284 10*3/uL (ref 150–400)
RBC: 4.08 MIL/uL (ref 3.87–5.11)
RDW: 13.1 % (ref 11.5–15.5)
WBC: 6.7 10*3/uL (ref 4.0–10.5)
nRBC: 0 % (ref 0.0–0.2)

## 2023-10-10 LAB — COMPREHENSIVE METABOLIC PANEL WITH GFR
ALT: 20 U/L (ref 0–44)
AST: 26 U/L (ref 15–41)
Albumin: 3.4 g/dL — ABNORMAL LOW (ref 3.5–5.0)
Alkaline Phosphatase: 63 U/L (ref 38–126)
Anion gap: 13 (ref 5–15)
BUN: 6 mg/dL — ABNORMAL LOW (ref 8–23)
CO2: 30 mmol/L (ref 22–32)
Calcium: 9 mg/dL (ref 8.9–10.3)
Chloride: 96 mmol/L — ABNORMAL LOW (ref 98–111)
Creatinine, Ser: 0.97 mg/dL (ref 0.44–1.00)
GFR, Estimated: >60 mL/min (ref >60)
Glucose, Bld: 118 mg/dL — ABNORMAL HIGH (ref 70–99)
Potassium: 3.4 mmol/L — ABNORMAL LOW (ref 3.5–5.1)
Sodium: 139 mmol/L (ref 135–145)
Total Bilirubin: 0.5 mg/dL (ref 0.0–1.2)
Total Protein: 6.5 g/dL (ref 6.5–8.1)

## 2023-10-10 LAB — URINALYSIS, ROUTINE W REFLEX MICROSCOPIC
Bilirubin Urine: NEGATIVE
Glucose, UA: NEGATIVE mg/dL
Hgb urine dipstick: NEGATIVE
Ketones, ur: NEGATIVE mg/dL
Leukocytes,Ua: NEGATIVE
Nitrite: NEGATIVE
Protein, ur: NEGATIVE mg/dL
Specific Gravity, Urine: 1.004 — ABNORMAL LOW (ref 1.005–1.030)
pH: 8 (ref 5.0–8.0)

## 2023-10-10 MED ORDER — KETOROLAC TROMETHAMINE 15 MG/ML IJ SOLN
15.0000 mg | Freq: Once | INTRAMUSCULAR | Status: AC
  Administered 2023-10-10: 15 mg via INTRAVENOUS
  Filled 2023-10-10: qty 1

## 2023-10-10 MED ORDER — ENOXAPARIN SODIUM 40 MG/0.4ML IJ SOSY
40.0000 mg | PREFILLED_SYRINGE | INTRAMUSCULAR | Status: DC
  Administered 2023-10-11 – 2023-10-13 (×3): 40 mg via SUBCUTANEOUS
  Filled 2023-10-10 (×3): qty 0.4

## 2023-10-10 MED ORDER — DILTIAZEM HCL ER COATED BEADS 120 MG PO CP24
120.0000 mg | ORAL_CAPSULE | Freq: Every day | ORAL | Status: DC
  Administered 2023-10-11 – 2023-10-13 (×2): 120 mg via ORAL
  Filled 2023-10-10 (×4): qty 1

## 2023-10-10 MED ORDER — HYDROMORPHONE HCL 1 MG/ML IJ SOLN
0.5000 mg | INTRAMUSCULAR | Status: DC | PRN
  Administered 2023-10-10 – 2023-10-13 (×4): 0.5 mg via INTRAVENOUS
  Filled 2023-10-10 (×3): qty 0.5
  Filled 2023-10-10: qty 1

## 2023-10-10 MED ORDER — PANTOPRAZOLE SODIUM 40 MG PO TBEC
40.0000 mg | DELAYED_RELEASE_TABLET | Freq: Every day | ORAL | Status: DC
  Administered 2023-10-11 – 2023-10-13 (×3): 40 mg via ORAL
  Filled 2023-10-10 (×3): qty 1

## 2023-10-10 MED ORDER — PREGABALIN 75 MG PO CAPS
75.0000 mg | ORAL_CAPSULE | Freq: Two times a day (BID) | ORAL | Status: DC
  Administered 2023-10-10 – 2023-10-13 (×6): 75 mg via ORAL
  Filled 2023-10-10 (×3): qty 1
  Filled 2023-10-10: qty 3
  Filled 2023-10-10 (×3): qty 1

## 2023-10-10 MED ORDER — TIZANIDINE HCL 4 MG PO TABS
4.0000 mg | ORAL_TABLET | Freq: Three times a day (TID) | ORAL | Status: DC | PRN
  Administered 2023-10-11 – 2023-10-13 (×3): 4 mg via ORAL
  Filled 2023-10-10 (×3): qty 1

## 2023-10-10 MED ORDER — OXYCODONE-ACETAMINOPHEN 5-325 MG PO TABS
1.0000 | ORAL_TABLET | ORAL | Status: DC | PRN
  Administered 2023-10-11 – 2023-10-13 (×7): 1 via ORAL
  Filled 2023-10-10 (×8): qty 1

## 2023-10-10 MED ORDER — ASPIRIN 81 MG PO CHEW
81.0000 mg | CHEWABLE_TABLET | Freq: Every day | ORAL | Status: DC
  Administered 2023-10-11 – 2023-10-13 (×3): 81 mg via ORAL
  Filled 2023-10-10 (×3): qty 1

## 2023-10-10 MED ORDER — BUPROPION HCL ER (XL) 300 MG PO TB24
300.0000 mg | ORAL_TABLET | Freq: Every day | ORAL | Status: DC
  Administered 2023-10-11 – 2023-10-13 (×3): 300 mg via ORAL
  Filled 2023-10-10 (×3): qty 1

## 2023-10-10 MED ORDER — DIAZEPAM 5 MG/ML IJ SOLN
2.5000 mg | Freq: Once | INTRAMUSCULAR | Status: AC
  Administered 2023-10-10: 2.5 mg via INTRAVENOUS
  Filled 2023-10-10: qty 2

## 2023-10-10 MED ORDER — POTASSIUM CHLORIDE CRYS ER 20 MEQ PO TBCR
40.0000 meq | EXTENDED_RELEASE_TABLET | Freq: Once | ORAL | Status: AC
  Administered 2023-10-10: 40 meq via ORAL
  Filled 2023-10-10: qty 2

## 2023-10-10 MED ORDER — DULOXETINE HCL 60 MG PO CPEP
60.0000 mg | ORAL_CAPSULE | Freq: Every day | ORAL | Status: DC
  Administered 2023-10-11 – 2023-10-13 (×3): 60 mg via ORAL
  Filled 2023-10-10 (×3): qty 1

## 2023-10-10 MED ORDER — OXYCODONE HCL 5 MG PO TABS
5.0000 mg | ORAL_TABLET | ORAL | Status: DC | PRN
  Administered 2023-10-11 – 2023-10-13 (×8): 5 mg via ORAL
  Filled 2023-10-10 (×8): qty 1

## 2023-10-10 MED ORDER — TRAZODONE HCL 50 MG PO TABS
100.0000 mg | ORAL_TABLET | Freq: Every day | ORAL | Status: DC
  Administered 2023-10-10 – 2023-10-12 (×3): 100 mg via ORAL
  Filled 2023-10-10 (×4): qty 2

## 2023-10-10 MED ORDER — FINASTERIDE 5 MG PO TABS
5.0000 mg | ORAL_TABLET | Freq: Every day | ORAL | Status: DC
  Administered 2023-10-11 – 2023-10-13 (×3): 5 mg via ORAL
  Filled 2023-10-10 (×3): qty 1

## 2023-10-10 MED ORDER — OXYCODONE-ACETAMINOPHEN 10-325 MG PO TABS: 1.0000 | ORAL_TABLET | ORAL | Status: DC | PRN

## 2023-10-10 MED ORDER — LEVOTHYROXINE SODIUM 50 MCG PO TABS
50.0000 ug | ORAL_TABLET | Freq: Every day | ORAL | Status: DC
  Administered 2023-10-11 – 2023-10-13 (×3): 50 ug via ORAL
  Filled 2023-10-10 (×3): qty 1

## 2023-10-10 MED ORDER — ROPINIROLE HCL 1 MG PO TABS
1.0000 mg | ORAL_TABLET | Freq: Every day | ORAL | Status: DC
  Administered 2023-10-11 – 2023-10-13 (×3): 1 mg via ORAL
  Filled 2023-10-10 (×3): qty 1

## 2023-10-10 MED ORDER — FUROSEMIDE 40 MG PO TABS
20.0000 mg | ORAL_TABLET | Freq: Two times a day (BID) | ORAL | Status: DC
  Administered 2023-10-10 – 2023-10-13 (×6): 20 mg via ORAL
  Filled 2023-10-10 (×7): qty 1

## 2023-10-10 MED ORDER — CLONIDINE HCL 0.1 MG PO TABS
0.1000 mg | ORAL_TABLET | Freq: Two times a day (BID) | ORAL | Status: DC
  Administered 2023-10-10 – 2023-10-13 (×4): 0.1 mg via ORAL
  Filled 2023-10-10 (×7): qty 1

## 2023-10-10 MED ORDER — HYDROMORPHONE HCL 1 MG/ML IJ SOLN
1.0000 mg | Freq: Once | INTRAMUSCULAR | Status: AC
  Administered 2023-10-10: 1 mg via INTRAVENOUS
  Filled 2023-10-10: qty 1

## 2023-10-10 MED ORDER — DONEPEZIL HCL 10 MG PO TABS
10.0000 mg | ORAL_TABLET | Freq: Every day | ORAL | Status: DC
  Administered 2023-10-11 – 2023-10-13 (×3): 10 mg via ORAL
  Filled 2023-10-10 (×3): qty 1

## 2023-10-10 MED ORDER — LOSARTAN POTASSIUM 50 MG PO TABS
50.0000 mg | ORAL_TABLET | Freq: Every day | ORAL | Status: DC
  Administered 2023-10-11 – 2023-10-13 (×3): 50 mg via ORAL
  Filled 2023-10-10 (×3): qty 1

## 2023-10-10 MED ORDER — IOHEXOL 350 MG/ML SOLN
75.0000 mL | Freq: Once | INTRAVENOUS | Status: AC | PRN
  Administered 2023-10-10: 75 mL via INTRAVENOUS

## 2023-10-10 MED ORDER — METHOCARBAMOL 500 MG PO TABS
500.0000 mg | ORAL_TABLET | Freq: Once | ORAL | Status: AC
  Administered 2023-10-10: 500 mg via ORAL
  Filled 2023-10-10: qty 1

## 2023-10-10 MED ORDER — OXYCODONE HCL 5 MG PO TABS
5.0000 mg | ORAL_TABLET | Freq: Once | ORAL | Status: AC
  Administered 2023-10-10: 5 mg via ORAL
  Filled 2023-10-10: qty 1

## 2023-10-10 NOTE — H&P (Incomplete)
 Date: 10/10/2023               Patient Name:  Lauren Roth MRN: [id number]  DOB: 17-Jul-1945 Age / Sex: 78 y.o., female   PCP: Sherre Clapper, MD         Medical Service: Internal Medicine Teaching Service         Attending Physician: Dr. Trudy, Mliss Dragon, MD      First Contact: Dr. Lonni Africa, DO Pager [phone]    Second Contact: Dr. Hadassah Kristy Ahr, MD          After Hours (After 5p/  First Contact Pager: [phone]  weekends / holidays): Second Contact Pager: [phone]   SUBJECTIVE   Chief Complaint: Low Back Pain  History of Present Illness: Lauren Roth is a 78 year old female with relevant history of lumbosacral DDD, chronic pain syndrome, fibromyalgia, migraines, neuropathy who presents with acute worsening of low back pain.  ED Course:  Medications:  Current Outpatient Medications  Medication Instructions  . albuterol  (VENTOLIN  HFA) 108 (90 Base) MCG/ACT inhaler 2 puffs, Every 6 hours PRN  . aspirin 81 mg, Daily  . Biotin 10 MG TABS Take by mouth.  . buprenorphine  (SUBUTEX ) 2 mg, Daily  . buPROPion (WELLBUTRIN XL) 300 MG 24 hr tablet 1 tablet, Daily  . Calcium Carb-Cholecalciferol 500-10 MG-MCG TABS Take by mouth.  . cloNIDine (CATAPRES) 0.1 MG tablet 1 tablet, 2 times daily  . diclofenac Sodium (VOLTAREN) 0.5 g, 4 times daily  . diltiazem (CARDIZEM CD) 120 mg, Daily  . diltiazem (TIAZAC) 240 mg, Daily  . donepezil  (ARICEPT ) 10 mg, Oral, Daily  . doxycycline (VIBRA-TABS) 100 mg, 2 times daily  . DULoxetine (CYMBALTA) 60 mg, Daily  . ergocalciferol (VITAMIN D2) 50,000 Units, Weekly  . finasteride (PROSCAR) 5 mg, Daily  . fluticasone  (FLONASE ) 50 MCG/ACT nasal spray 1 spray, Each Nare, Daily PRN  . furosemide  (LASIX ) 20 mg, Oral, 2 times daily  . iron polysaccharides (NIFEREX) 150 mg, Daily  . levothyroxine  (SYNTHROID ) 50 mcg, Oral, Daily  . lidocaine (LIDODERM) 5 % Place onto the skin.  SABRA loratadine (CLARITIN) 10 mg, Daily  . losartan  (COZAAR) 50 mg, Daily  . Misc Natural Products (FIBER 7 PO) Take by mouth.  . montelukast (SINGULAIR) 10 mg, Daily at bedtime  . nitroGLYCERIN  (NITROSTAT ) 0.4 mg, Sublingual, As needed  . omeprazole  (PRILOSEC) 20 mg, Oral, Daily  . oxybutynin (DITROPAN-XL) 10 mg, Daily  . oxyCODONE-acetaminophen  (PERCOCET) 10-325 MG tablet 1 tablet, Every 4 hours PRN  . pregabalin (LYRICA) 150 mg, Nightly  . propranolol ER (INDERAL LA) 60 mg, Daily  . rOPINIRole (REQUIP) 1 MG tablet 1 tablet, Daily  . Suvorexant (BELSOMRA) 15 MG TABS Take 1 tab at night  . thiamine (VITAMIN B1) 100 MG tablet Take by mouth.  SABRA tiZANidine (ZANAFLEX) 4 mg, 3 times daily PRN  . topiramate (TOPAMAX) 25 mg, 2 times daily  . traZODone (DESYREL) 100 mg, Daily at bedtime    Past Medical History Past Medical History:  Diagnosis Date  . Allergic rhinitis 01/16/2009   Qualifier: Diagnosis of  By: Kassie MD, Alyce LABOR   Formatting of this note might be different from the original. Overview:  Qualifier: Diagnosis of  By: Kassie MD, Alyce LABOR SABRA Arthritis   . Chronic migraine w/o aura, not intractable, w/o stat migr   . Chronic pain of right knee 06/12/2021  . Chronic pain syndrome 07/30/2013  . DEGENERATIVE DISC DISEASE, LUMBOSACRAL SPINE 01/16/2009   Qualifier:  Diagnosis of  By: Kassie MD, Alyce DELENA QUAY Depression   . DIAPHORESIS 01/16/2009   Qualifier: Diagnosis of  By: Kassie MD, Sean A   . Edema 01/16/2009   Qualifier: Diagnosis of  By: Kassie MD, Alyce DELENA Deal of this note might be different from the original. Overview:  Qualifier: Diagnosis of  By: Kassie MD, Sean A  . Essential hypertension 01/28/2017  . Essential thrombocytosis (HCC) 11/25/2017  . Fibromyalgia   . GERD 01/16/2009   Qualifier: Diagnosis of  By: Kassie MD, Alyce DELENA   . Heart failure (HCC) 05/08/2021  . Hypertension   . HYPERTENSION 01/16/2009   Qualifier: Diagnosis of  By: Kassie MD, Alyce DELENA   . Hypokalemia 01/16/2009   Qualifier: Diagnosis of   By: Kassie MD, Alyce DELENA Deal of this note might be different from the original. Overview:  Qualifier: Diagnosis of  By: Kassie MD, Alyce DELENA QUAY Left hip pain 05/24/2019  . Lumbosacral spondylosis 10/09/2010  . Lymphedema 02/10/2021  . Medication management 08/25/2010  . Memory loss 01/16/2009   Qualifier: Diagnosis of  By: Kassie MD, Alyce DELENA Deal of this note might be different from the original. Overview:  Qualifier: Diagnosis of  By: Kassie MD, Alyce DELENA QUAY Menopausal and postmenopausal disorder 01/16/2009   Qualifier: Diagnosis of  By: Kassie MD, Alyce DELENA   Formatting of this note might be different from the original. Overview:  Qualifier: Diagnosis of  By: Kassie MD, Alyce DELENA QUAY Moderate aortic regurgitation 05/21/2019  . Neuropathic pain 09/25/2020  . Neuropathy   . Obesity (BMI 30.0-34.9) 10/13/2021  . Pain in soft tissues of limb 06/20/2012   Formatting of this note might be different from the original. Overview:  IMPRESSION: Left knee pain Formatting of this note might be different from the original. IMPRESSION: Left knee pain  . Pain in thoracic spine 07/30/2013  . Palpitations 01/28/2017  . Primary osteoarthritis of right knee 10/06/2014  . Restless legs syndrome 08/25/2010  . Shortness of breath on exertion 01/28/2017  . SI (sacroiliac) pain 05/10/2016  . Thoracic spondylosis 11/20/2010   Overview:  Overview:  IMPRESSION: Thoracic radiculopathy Overview:  IMPRESSION: Thoracic radiculopathy  Formatting of this note might be different from the original. Overview:  IMPRESSION: Thoracic radiculopathy Formatting of this note might be different from the original. IMPRESSION: Thoracic radiculopathy  . Thrombocythemia, essential (HCC) 09/12/2013  . Thyroid  disease   . TMJ (dislocation of temporomandibular joint) 07/30/2013  . Tremor 12/29/2021  . Unspecified hypothyroidism 01/16/2009   Qualifier: Diagnosis of  By: Kassie MD, Alyce DELENA   . Uterine cancer (HCC) 07/30/2013     Past Surgical History Past Surgical History:  Procedure Laterality Date  . APPENDECTOMY    . CERVICAL DISC SURGERY    . CHOLECYSTECTOMY    . FRACTURE SURGERY Left    Left arm has a metal plate  . KNEE SURGERY Bilateral    TKR  . ROBOTIC ASSISTED LAP VAGINAL HYSTERECTOMY     did not take ovaries. had uterine cancer.    Social:  Lives With: Occupation: Support: Level of Function: PCP: Substances:  Family History:  Family History  Problem Relation Age of Onset  . Deep vein thrombosis Father   . Heart disease Father   . Heart failure Father   . Alzheimer's disease Father   . Heart disease Brother   . Pulmonary embolism Brother   . Heart disease Brother  Allergies: Allergies as of 10/10/2023 - Review Complete 10/10/2023  Allergen Reaction Noted  . Cottonseed oil Hives and Itching 05/06/2015  . Codeine sulfate      Review of Systems: A complete ROS was negative except as per HPI.   OBJECTIVE:   Physical Exam: Blood pressure (!) 156/60, pulse 74, temperature 98.2 F (36.8 C), temperature source Oral, resp. rate 18, weight 97 kg, SpO2 92%.  Constitutional: well-appearing *** sitting in ***, in no acute distress HENT: normocephalic atraumatic, mucous membranes moist Eyes: conjunctiva non-erythematous Neck: supple Cardiovascular: regular rate and rhythm, no m/r/g Pulmonary/Chest: normal work of breathing on room air, lungs clear to auscultation bilaterally Abdominal: soft, non-tender, non-distended MSK: normal bulk and tone Neurological: alert & oriented x 3, 5/5 strength in bilateral upper and lower extremities, normal gait Skin: warm and dry Psych: ***  Labs: CBC    Component Value Date/Time   WBC 6.7 10/10/2023 1459   RBC 4.08 10/10/2023 1459   HGB 12.2 10/10/2023 1459   HGB 12.7 05/24/2023 1431   HCT 37.1 10/10/2023 1459   HCT 39.1 05/24/2023 1431   PLT 284 10/10/2023 1459   PLT 354 05/24/2023 1431   MCV 90.9 10/10/2023 1459   MCV 92  05/24/2023 1431   MCH 29.9 10/10/2023 1459   MCHC 32.9 10/10/2023 1459   RDW 13.1 10/10/2023 1459   RDW 13.1 05/24/2023 1431   LYMPHSABS 2.4 10/10/2023 1459   LYMPHSABS 3.1 02/03/2023 1527   MONOABS 0.7 10/10/2023 1459   EOSABS 0.2 10/10/2023 1459   EOSABS 0.2 02/03/2023 1527   BASOSABS 0.0 10/10/2023 1459   BASOSABS 0.0 02/03/2023 1527     CMP     Component Value Date/Time   NA 139 10/10/2023 1459   NA 139 05/24/2023 1431   K 3.4 (L) 10/10/2023 1459   CL 96 (L) 10/10/2023 1459   CO2 30 10/10/2023 1459   GLUCOSE 118 (H) 10/10/2023 1459   BUN 6 (L) 10/10/2023 1459   BUN 10 05/24/2023 1431   CREATININE 0.97 10/10/2023 1459   CALCIUM 9.0 10/10/2023 1459   PROT 6.5 10/10/2023 1459   PROT 6.9 05/24/2023 1431   ALBUMIN 3.4 (L) 10/10/2023 1459   ALBUMIN 4.1 05/24/2023 1431   AST 26 10/10/2023 1459   ALT 20 10/10/2023 1459   ALKPHOS 63 10/10/2023 1459   BILITOT 0.5 10/10/2023 1459   BILITOT 0.3 05/24/2023 1431   GFRNONAA >60 10/10/2023 1459    Imaging:  EKG: personally reviewed my interpretation is***. Prior EKG***  ASSESSMENT & PLAN:   Assessment & Plan by Problem: Principal Problem:   Compression fracture of L2 (HCC)   Marieta Omolara Carol is a 78 y.o. person living with a history of *** who presented with *** and admitted for *** on hospital day 0  *** ***  *** ***  *** ***  Diet: {NAMES:3044014::Normal,Heart Healthy,Carb-Modified,Renal,Carb/Renal,NPO,TPN,Tube Feeds} VTE: {NAMES:3044014::Heparin,Enoxaparin ,SCDs,DOAC,None} IVF: {NAMES:3044014::None,NS,1/2 NS,LR,D5,D10},{NAMES:3044014::None,10cc/hr,25cc/hr,50cc/hr,75cc/hr,100cc/hr,110cc/hr,125cc/hr,Bolus} Code: {NAMES:3044014::Full,DNR,DNI,DNR/DNI,Comfort Care,Unknown}  Prior to Admission Living Arrangement: {NAMES:3044014::Home, living ***,SNF, ***,Homeless,***} Anticipated Discharge Location:  {NAMES:3044014::Home,SNF,CIR,***} Barriers to Discharge: ***  Dispo: Admit patient to {STATUS:3044014::Observation with expected length of stay less than 2 midnights.,Inpatient with expected length of stay greater than 2 midnights.}  Signed: Marylu Gee, DO Internal Medicine Resident PGY-1 10/10/2023, 11:46 PM

## 2023-10-10 NOTE — ED Notes (Signed)
 Patient states the pain medicine she received is not working.  EDP made aware.

## 2023-10-10 NOTE — Consult Note (Signed)
 Reason for Consult: L2 compression fracture Referring Physician: EDP  Madeline Rung Lauren Roth is an 78 y.o. female.   HPI:  78 year old female with a long history of back problems who has been in pain management and has seen spine surgeons before and has had neck surgery and has been told she has a bad back states that she twisted in her house today and developed the acute onset of low back pain with some radiation to the right leg down to the foot.  She denies weakness.  No numbness or tingling.  Pain is acute and severe and is difficult for her to stand because of the pain.  CT scan suggested age-indeterminate minor L2 superior endplate fracture and neurosurgical evaluation was requested.  She cannot recall the name of her spine doctor.  Past Medical History:  Diagnosis Date   Allergic rhinitis 01/16/2009   Qualifier: Diagnosis of  By: Kassie MD, Alyce DELENA Deal of this note might be different from the original. Overview:  Qualifier: Diagnosis of  By: Kassie MD, Alyce A   Arthritis    Chronic migraine w/o aura, not intractable, w/o stat migr    Chronic pain of right knee 06/12/2021   Chronic pain syndrome 07/30/2013   DEGENERATIVE DISC DISEASE, LUMBOSACRAL SPINE 01/16/2009   Qualifier: Diagnosis of  By: Kassie MD, Alyce A    Depression    DIAPHORESIS 01/16/2009   Qualifier: Diagnosis of  By: Kassie MD, Alyce A    Edema 01/16/2009   Qualifier: Diagnosis of  By: Kassie MD, Alyce DELENA   Formatting of this note might be different from the original. Overview:  Qualifier: Diagnosis of  By: Kassie MD, Sean A   Essential hypertension 01/28/2017   Essential thrombocytosis (HCC) 11/25/2017   Fibromyalgia    GERD 01/16/2009   Qualifier: Diagnosis of  By: Kassie MD, Sean A    Heart failure (HCC) 05/08/2021   Hypertension    HYPERTENSION 01/16/2009   Qualifier: Diagnosis of  By: Kassie MD, Sean A    Hypokalemia 01/16/2009   Qualifier: Diagnosis of  By: Kassie MD, Alyce DELENA   Formatting  of this note might be different from the original. Overview:  Qualifier: Diagnosis of  By: Kassie MD, Sean A   Left hip pain 05/24/2019   Lumbosacral spondylosis 10/09/2010   Lymphedema 02/10/2021   Medication management 08/25/2010   Memory loss 01/16/2009   Qualifier: Diagnosis of  By: Kassie MD, Alyce DELENA   Formatting of this note might be different from the original. Overview:  Qualifier: Diagnosis of  By: Kassie MD, Sean A   Menopausal and postmenopausal disorder 01/16/2009   Qualifier: Diagnosis of  By: Kassie MD, Alyce DELENA   Formatting of this note might be different from the original. Overview:  Qualifier: Diagnosis of  By: Kassie MD, Sean A   Moderate aortic regurgitation 05/21/2019   Neuropathic pain 09/25/2020   Neuropathy    Obesity (BMI 30.0-34.9) 10/13/2021   Pain in soft tissues of limb 06/20/2012   Formatting of this note might be different from the original. Overview:  IMPRESSION: Left knee pain Formatting of this note might be different from the original. IMPRESSION: Left knee pain   Pain in thoracic spine 07/30/2013   Palpitations 01/28/2017   Primary osteoarthritis of right knee 10/06/2014   Restless legs syndrome 08/25/2010   Shortness of breath on exertion 01/28/2017   SI (sacroiliac) pain 05/10/2016   Thoracic spondylosis 11/20/2010   Overview:  Overview:  IMPRESSION:  Thoracic radiculopathy Overview:  IMPRESSION: Thoracic radiculopathy  Formatting of this note might be different from the original. Overview:  IMPRESSION: Thoracic radiculopathy Formatting of this note might be different from the original. IMPRESSION: Thoracic radiculopathy   Thrombocythemia, essential (HCC) 09/12/2013   Thyroid  disease    TMJ (dislocation of temporomandibular joint) 07/30/2013   Tremor 12/29/2021   Unspecified hypothyroidism 01/16/2009   Qualifier: Diagnosis of  By: Kassie MD, Sean A    Uterine cancer (HCC) 07/30/2013    Past Surgical History:  Procedure Laterality Date    APPENDECTOMY     CERVICAL DISC SURGERY     CHOLECYSTECTOMY     FRACTURE SURGERY Left    Left arm has a metal plate   KNEE SURGERY Bilateral    TKR   ROBOTIC ASSISTED LAP VAGINAL HYSTERECTOMY     did not take ovaries. had uterine cancer.    Allergies  Allergen Reactions   Cottonseed Oil Hives and Itching    Hives, Itching Raw Cotton    Codeine Sulfate     Social History   Tobacco Use   Smoking status: Former   Smokeless tobacco: Never   Tobacco comments:    at age 7  Substance Use Topics   Alcohol use: No    Family History  Problem Relation Age of Onset   Deep vein thrombosis Father    Heart disease Father    Heart failure Father    Alzheimer's disease Father    Heart disease Brother    Pulmonary embolism Brother    Heart disease Brother      Review of Systems  Positive ROS: As above  All other systems have been reviewed and were otherwise negative with the exception of those mentioned in the HPI and as above.  Objective: Vital signs in last 24 hours: Temp:  [98.2 F (36.8 C)] 98.2 F (36.8 C) (06/23 1452) Pulse Rate:  [63-74] 74 (06/23 1823) Resp:  [18-20] 18 (06/23 1823) BP: (156-158)/(60-61) 156/60 (06/23 1823) SpO2:  [92 %-99 %] 92 % (06/23 1823) Weight:  [97 kg] 97 kg (06/23 1453)  General Appearance: Alert, cooperative, no distress, appears stated age Head: Normocephalic, without obvious abnormality, atraumatic Eyes: PERRL, conjunctiva/corneas clear, EOM's intact     Neck: Supple, symmetrical, trachea midline Lungs: respirations unlabored Heart: Regular rate and rhythm Abdomen: Soft, non-tender   NEUROLOGIC:   Mental status: A&O x4, no aphasia, good attention span, Memory and fund of knowledge appear to be appropriate Motor Exam - grossly normal, normal tone and bulk Sensory Exam - grossly normal Reflexes: symmetric, no pathologic reflexes, No Hoffman's, No clonus Coordination - grossly normal Gait -not tested Balance -not tested Cranial  Nerves: I: smell Not tested  II: visual acuity  OS: na  OD: na  II: visual fields Full to confrontation  II: pupils Equal, round, reactive to light  III,VII: ptosis None  III,IV,VI: extraocular muscles  Full ROM  V: mastication Normal  V: facial light touch sensation  Normal  V,VII: corneal reflex  Present  VII: facial muscle function - upper  Normal  VII: facial muscle function - lower Normal  VIII: hearing Not tested  IX: soft palate elevation  Normal  IX,X: gag reflex Present  XI: trapezius strength  5/5  XI: sternocleidomastoid strength 5/5  XI: neck flexion strength  5/5  XII: tongue strength  Normal    Data Review Lab Results  Component Value Date   WBC 6.7 10/10/2023   HGB 12.2 10/10/2023  HCT 37.1 10/10/2023   MCV 90.9 10/10/2023   PLT 284 10/10/2023   Lab Results  Component Value Date   NA 139 10/10/2023   K 3.4 (L) 10/10/2023   CL 96 (L) 10/10/2023   CO2 30 10/10/2023   BUN 6 (L) 10/10/2023   CREATININE 0.97 10/10/2023   GLUCOSE 118 (H) 10/10/2023   No results found for: INR, PROTIME  Radiology: CT Thoracic Spine Wo Contrast Result Date: 10/10/2023 CLINICAL DATA:  Mid back pain EXAM: CT THORACIC SPINE WITHOUT CONTRAST TECHNIQUE: Multidetector CT images of the thoracic were obtained using the standard protocol without intravenous contrast. RADIATION DOSE REDUCTION: This exam was performed according to the departmental dose-optimization program which includes automated exposure control, adjustment of the mA and/or kV according to patient size and/or use of iterative reconstruction technique. COMPARISON:  None Available. FINDINGS: Alignment: Normal Vertebrae: No acute fracture or focal pathologic process. Paraspinal and other soft tissues: Negative. Disc levels: Early spurring anteriorly.  No disc herniation. IMPRESSION: No acute bony abnormality. Electronically Signed   By: Franky Crease M.D.   On: 10/10/2023 17:25   CT L-SPINE NO CHARGE Result Date:  10/10/2023 CLINICAL DATA:  Mid back pain EXAM: CT LUMBAR SPINE WITHOUT CONTRAST TECHNIQUE: Multidetector CT imaging of the lumbar spine was performed without intravenous contrast administration. Multiplanar CT image reconstructions were also generated. RADIATION DOSE REDUCTION: This exam was performed according to the departmental dose-optimization program which includes automated exposure control, adjustment of the mA and/or kV according to patient size and/or use of iterative reconstruction technique. COMPARISON:  MRI 07/06/2023 FINDINGS: Segmentation: 5 lumbar type vertebrae. Alignment: Normal Vertebrae: There is slight compression fracture through the superior endplate of L2. Associated sclerosis noted in the superior L2 vertebral body suggests healing, but this is new since MRI 07/06/2023. No additional fracture. No focal suspicious osseous abnormality. Paraspinal and other soft tissues: Negative Disc levels: Broad-based disc bulges noted at L3-4 and L4-5. No focal disc herniation. Mild central canal stenosis at these levels. Diffuse degenerative facet disease. IMPRESSION: Slight compression fracture through the superior endplate of L2. This is age indeterminate, but new since 07/06/2023. Broad-based disc bulges with central canal stenosis at L3-4 and L4-5. Electronically Signed   By: Franky Crease M.D.   On: 10/10/2023 17:24   CT ABDOMEN PELVIS W CONTRAST Result Date: 10/10/2023 CLINICAL DATA:  Right lower quadrant pain EXAM: CT ABDOMEN AND PELVIS WITH CONTRAST TECHNIQUE: Multidetector CT imaging of the abdomen and pelvis was performed using the standard protocol following bolus administration of intravenous contrast. RADIATION DOSE REDUCTION: This exam was performed according to the departmental dose-optimization program which includes automated exposure control, adjustment of the mA and/or kV according to patient size and/or use of iterative reconstruction technique. CONTRAST:  75mL OMNIPAQUE IOHEXOL 350  MG/ML SOLN COMPARISON:  10/17/2020 FINDINGS: Lower chest: No acute abnormality Hepatobiliary: No focal liver abnormality is seen. Status post cholecystectomy. No biliary dilatation. Pancreas: No focal abnormality or ductal dilatation. Fatty replacement. Spleen: No focal abnormality.  Normal size. Adrenals/Urinary Tract: No adrenal abnormality. No focal renal abnormality. No stones or hydronephrosis. Urinary bladder is unremarkable. Stomach/Bowel: Normal appendix. Stomach, large and small bowel grossly unremarkable. No bowel obstruction or inflammatory process. Vascular/Lymphatic: Aortic atherosclerosis. No evidence of aneurysm or adenopathy. Reproductive: Prior hysterectomy.  No adnexal masses. Other: No free fluid or free air. Musculoskeletal: No acute bony abnormality. IMPRESSION: No acute findings in the abdomen or pelvis. Aortic atherosclerosis. Electronically Signed   By: Franky Crease M.D.   On:  10/10/2023 17:20     Assessment/Plan: Estimated body mass index is 36.71 kg/m as calculated from the following:   Height as of 09/01/23: 5' 4 (1.626 m).   Weight as of this encounter: 23 kg.   78 year old female with acute onset low back pain and a CT scan with suggest an age-indeterminate minor L2 compression fracture of the superior endplate.  Difficult to know if this has any relation to her current pain syndrome.  It is very minor and needs no further workup or treatment at this time.  Would have her follow-up with her spine doctor he can follow this with serial plain films.  Happy to see her back in the office here if need be.   Alm GORMAN Molt 10/10/2023 7:43 PM

## 2023-10-10 NOTE — ED Triage Notes (Signed)
 PT BIB [facility] complains of back pain and bilateral lower leg swelling. No injury.  Has been going to pain management clinic.   150/70 63 hr 95% ra Cbg 59  250 10%dextrose given  Cbg 150

## 2023-10-10 NOTE — H&P (Signed)
 Date: 10/11/2023               Patient Name:  Lauren Roth MRN: [id number]  DOB: [birth year] Age / Sex: 78 y.o., female   PCP: Sherre Clapper, MD         Medical Service: Internal Medicine Teaching Service         Attending Physician: Dr. Trudy, Mliss Dragon, MD      First Contact: Dr. Lonni Africa, DO Pager [phone]    Second Contact: Dr. Hadassah Kristy Ahr, MD          After Hours (After 5p/  First Contact Pager: [phone]  weekends / holidays): Second Contact Pager: [phone]   SUBJECTIVE   Chief Complaint: Low Back Pain  History of Present Illness: Lauren Roth is a 78 year old female with history of lumbosacral DDD, chronic pain syndrome, fibromyalgia, HTN, SVT, DVT, chronic lymphedema, HFpEF (TTE, 10/2022), hypothyroidism, GERD, memory loss, depression, migraines, neuropathy, obesity, restless leg syndrome who presents with acute worsening of low back pain.  Patient stood up this morning and when she turned, felt sharp 10/10 stabbing left back pain with radiation down right leg to foot.  No prior history with this.  Denies urinary/bowel incontinence and perennial numbness.  She is also endorsing worsening bilateral R > L lower extremity swelling for the past few days unrelieved with normal dose of Lasix .  Denies SOB, orthopnea, PND, chest pain/palpitations.  She also states that 5 days ago she began having bilateral symmetrical shoulder, hip, and ankle pain that all occurred at once.  No weakness or though effort is diminished with pain.  No paresthesia. Denies fevers, chills, visual changes, cough, recent illness/travel/vaccination, abdominal pain, dysuria, change in bowel habits.   Medications:  Current Outpatient Medications  Medication Instructions   albuterol  (VENTOLIN  HFA) 108 (90 Base) MCG/ACT inhaler 2 puffs, Every 6 hours PRN   aspirin 81 mg, Daily   Biotin 10 MG TABS Take by mouth.   buprenorphine  (SUBUTEX ) 2 mg, Daily   buPROPion (WELLBUTRIN XL) 300  MG 24 hr tablet 1 tablet, Daily   Calcium Carb-Cholecalciferol 500-10 MG-MCG TABS Take by mouth.   cloNIDine (CATAPRES) 0.1 MG tablet 1 tablet, 2 times daily   diclofenac Sodium (VOLTAREN) 0.5 g, 4 times daily   diltiazem (CARDIZEM CD) 120 mg, Daily   diltiazem (TIAZAC) 240 mg, Daily   donepezil  (ARICEPT ) 10 mg, Oral, Daily   doxycycline (VIBRA-TABS) 100 mg, 2 times daily   DULoxetine (CYMBALTA) 60 mg, Daily   ergocalciferol (VITAMIN D2) 50,000 Units, Weekly   finasteride (PROSCAR) 5 mg, Daily   fluticasone  (FLONASE ) 50 MCG/ACT nasal spray 1 spray, Each Nare, Daily PRN   furosemide  (LASIX ) 20 mg, Oral, 2 times daily   iron polysaccharides (NIFEREX) 150 mg, Daily   levothyroxine  (SYNTHROID ) 50 mcg, Oral, Daily   lidocaine (LIDODERM) 5 % Place onto the skin.   loratadine (CLARITIN) 10 mg, Daily   losartan (COZAAR) 50 mg, Daily   Misc Natural Products (FIBER 7 PO) Take by mouth.   montelukast (SINGULAIR) 10 mg, Daily at bedtime   nitroGLYCERIN  (NITROSTAT ) 0.4 mg, Sublingual, As needed   omeprazole  (PRILOSEC) 20 mg, Oral, Daily   oxybutynin (DITROPAN-XL) 10 mg, Daily   oxyCODONE-acetaminophen  (PERCOCET) 10-325 MG tablet 1 tablet, Every 4 hours PRN   pregabalin (LYRICA) 150 mg, Nightly   propranolol ER (INDERAL LA) 60 mg, Daily   rOPINIRole (REQUIP) 1 MG tablet 1 tablet, Daily   Suvorexant (BELSOMRA) 15 MG TABS Take 1 tab  at night   thiamine (VITAMIN B1) 100 MG tablet Take by mouth.   tiZANidine (ZANAFLEX) 4 mg, 3 times daily PRN   topiramate (TOPAMAX) 25 mg, 2 times daily   traZODone (DESYREL) 100 mg, Daily at bedtime    Past Medical History Past Medical History:  Diagnosis Date   Allergic rhinitis 01/16/2009   Qualifier: Diagnosis of  By: Kassie MD, Alyce LABOR   Formatting of this note might be different from the original. Overview:  Qualifier: Diagnosis of  By: Kassie MD, Alyce A   Arthritis    Chronic migraine w/o aura, not intractable, w/o stat migr    Chronic pain of right knee  06/12/2021   Chronic pain syndrome 07/30/2013   DEGENERATIVE DISC DISEASE, LUMBOSACRAL SPINE 01/16/2009   Qualifier: Diagnosis of  By: Kassie MD, Sean A    Depression    DIAPHORESIS 01/16/2009   Qualifier: Diagnosis of  By: Kassie MD, Alyce A    Edema 01/16/2009   Qualifier: Diagnosis of  By: Kassie MD, Alyce LABOR   Formatting of this note might be different from the original. Overview:  Qualifier: Diagnosis of  By: Kassie MD, Sean A   Essential hypertension 01/28/2017   Essential thrombocytosis (HCC) 11/25/2017   Fibromyalgia    GERD 01/16/2009   Qualifier: Diagnosis of  By: Kassie MD, Sean A    Heart failure (HCC) 05/08/2021   Hypertension    HYPERTENSION 01/16/2009   Qualifier: Diagnosis of  By: Kassie MD, Sean A    Hypokalemia 01/16/2009   Qualifier: Diagnosis of  By: Kassie MD, Alyce LABOR   Formatting of this note might be different from the original. Overview:  Qualifier: Diagnosis of  By: Kassie MD, Sean A   Left hip pain 05/24/2019   Lumbosacral spondylosis 10/09/2010   Lymphedema 02/10/2021   Medication management 08/25/2010   Memory loss 01/16/2009   Qualifier: Diagnosis of  By: Kassie MD, Alyce LABOR   Formatting of this note might be different from the original. Overview:  Qualifier: Diagnosis of  By: Kassie MD, Sean A   Menopausal and postmenopausal disorder 01/16/2009   Qualifier: Diagnosis of  By: Kassie MD, Alyce LABOR   Formatting of this note might be different from the original. Overview:  Qualifier: Diagnosis of  By: Kassie MD, Sean A   Moderate aortic regurgitation 05/21/2019   Neuropathic pain 09/25/2020   Neuropathy    Obesity (BMI 30.0-34.9) 10/13/2021   Pain in soft tissues of limb 06/20/2012   Formatting of this note might be different from the original. Overview:  IMPRESSION: Left knee pain Formatting of this note might be different from the original. IMPRESSION: Left knee pain   Pain in thoracic spine 07/30/2013   Palpitations 01/28/2017   Primary  osteoarthritis of right knee 10/06/2014   Restless legs syndrome 08/25/2010   Shortness of breath on exertion 01/28/2017   SI (sacroiliac) pain 05/10/2016   Thoracic spondylosis 11/20/2010   Overview:  Overview:  IMPRESSION: Thoracic radiculopathy Overview:  IMPRESSION: Thoracic radiculopathy  Formatting of this note might be different from the original. Overview:  IMPRESSION: Thoracic radiculopathy Formatting of this note might be different from the original. IMPRESSION: Thoracic radiculopathy   Thrombocythemia, essential (HCC) 09/12/2013   Thyroid  disease    TMJ (dislocation of temporomandibular joint) 07/30/2013   Tremor 12/29/2021   Unspecified hypothyroidism 01/16/2009   Qualifier: Diagnosis of  By: Kassie MD, Sean A    Uterine cancer (HCC) 07/30/2013    Past Surgical History Past Surgical  History:  Procedure Laterality Date   APPENDECTOMY     CERVICAL DISC SURGERY     CHOLECYSTECTOMY     FRACTURE SURGERY Left    Left arm has a metal plate   KNEE SURGERY Bilateral    TKR   ROBOTIC ASSISTED LAP VAGINAL HYSTERECTOMY     did not take ovaries. had uterine cancer.    Social:  Lives at home with Husband Support: Daughter Level of Function: Generally independent though has required some help with ADL's PCP: Abigail Free, MD Substances: Denies all  Family History:  Family History  Problem Relation Age of Onset   Deep vein thrombosis Father    Heart disease Father    Heart failure Father    Alzheimer's disease Father    Heart disease Brother    Pulmonary embolism Brother    Heart disease Brother     Allergies: Allergies as of 10/10/2023 - Review Complete 10/10/2023  Allergen Reaction Noted   Cottonseed oil Hives and Itching 05/06/2015   Codeine sulfate      Review of Systems: A complete ROS was negative except as per HPI.   OBJECTIVE:   Physical Exam: Blood pressure (!) 156/60, pulse 74, temperature 98.2 F (36.8 C), temperature source Oral, resp. rate 18,  weight 97 kg, SpO2 92%.   Constitutional: lying in bed, in no acute distress Cardiovascular: regular rate and rhythm, no m/r/g, bilateral R > L 3 + LEE Pulmonary/Chest: normal work of breathing on room air, lungs clear to anterior auscultation (limited exam 2/2 pain) Abdominal: soft, non-tender, non-distended MSK: inability to adduct left shoulder 2/2 pain, no overt deformity, RLE with moderate diffuse TTP   Neurological: able to move all extremities though effort reduced 2/2 pain, sensation intact bilaterally Skin: warm and dry, bilateral lower extremities with diffuse erythema Psych: normal mood and behavior   Labs: CBC    Component Value Date/Time   WBC 6.7 10/10/2023 1459   RBC 4.08 10/10/2023 1459   HGB 12.2 10/10/2023 1459   HGB 12.7 05/24/2023 1431   HCT 37.1 10/10/2023 1459   HCT 39.1 05/24/2023 1431   PLT 284 10/10/2023 1459   PLT 354 05/24/2023 1431   MCV 90.9 10/10/2023 1459   MCV 92 05/24/2023 1431   MCH 29.9 10/10/2023 1459   MCHC 32.9 10/10/2023 1459   RDW 13.1 10/10/2023 1459   RDW 13.1 05/24/2023 1431   LYMPHSABS 2.4 10/10/2023 1459   LYMPHSABS 3.1 02/03/2023 1527   MONOABS 0.7 10/10/2023 1459   EOSABS 0.2 10/10/2023 1459   EOSABS 0.2 02/03/2023 1527   BASOSABS 0.0 10/10/2023 1459   BASOSABS 0.0 02/03/2023 1527     CMP     Component Value Date/Time   NA 139 10/10/2023 1459   NA 139 05/24/2023 1431   K 3.4 (L) 10/10/2023 1459   CL 96 (L) 10/10/2023 1459   CO2 30 10/10/2023 1459   GLUCOSE 118 (H) 10/10/2023 1459   BUN 6 (L) 10/10/2023 1459   BUN 10 05/24/2023 1431   CREATININE 0.97 10/10/2023 1459   CALCIUM 9.0 10/10/2023 1459   PROT 6.5 10/10/2023 1459   PROT 6.9 05/24/2023 1431   ALBUMIN 3.4 (L) 10/10/2023 1459   ALBUMIN 4.1 05/24/2023 1431   AST 26 10/10/2023 1459   ALT 20 10/10/2023 1459   ALKPHOS 63 10/10/2023 1459   BILITOT 0.5 10/10/2023 1459   BILITOT 0.3 05/24/2023 1431   GFRNONAA >60 10/10/2023 1459    ASSESSMENT & PLAN:    Assessment &  Plan by Problem: Principal Problem:   Compression fracture of L2 (HCC) Active Problems:   Hypothyroidism   Essential hypertension   GERD   Memory loss   Chronic pain syndrome   Depression   Fibromyalgia   Leatta Kyira Volkert is a 78 y.o. female with history of lumbosacral DDD, chronic pain syndrome, fibromyalgia, HTN, SVT, DVT, chronic lymphedema, HFpEF (TTE, 10/2022), hypothyroidism, GERD, memory loss, depression, migraines, neuropathy, obesity, restless leg syndrome who presents with acute worsening of low back pain. Admitted for pain control 2/2 L2 compression fracture.  Compression Fracture of L2 Bilateral Shoulder, Hip, Ankle Pain Fibromyalgia Chronic pain syndrome Lumbosacral DDD Presented with 10/10 low back pain with radiation to right leg to foot. CT L-spine shows compression fracture through superior endplate of L2, age-indeterminate but new since 06/2023.  Further shows disc bulges with central canal stenosis at L3-4 and L4-5.  Neurosurgery consulted in ED who did not see need for further workup with recommendation to follow up with her spine doctor. Received 1 mg Dilaudid x 2 and Toradol  in ED with some relief.  Review of records show extensive history of chronic back pain. Patient follows [facility] and was seen 4/16 for low back and right knee pain.  She has also seen orthopedic surgeon, Rajbir Dennise Mantis, MD, in 2024 who recommended non-operative management.  She has received SI and lumbosacral steroid injections with minimal relief.  Although patient is in considerable pain, I am reassured by both imaging and neurosurgery consultation.  Unfortunately, this seems to be a chronic issue with an acute worsening. Unclear etiology of bilateral symmetrical shoulder, hip, and ankle pain given that it all began acutely a few days ago.  I do wonder if underlying fibromyalgia/chronic pain are contributing factors.  However, on exam left  shoulder pain is markedly more severe.  No weakness.  No precipitating illness or vaccination. - Continue home Percocet 5-325 every 4 as needed, Zanaflex 4 mg 3 times daily as needed, Lyrica 75 mg twice daily, Cymbalta 60 mg daily. - IV Dilaudid 0.5 mg every 4 as needed for breakthrough pain - DG left shoulder - PT/OT  Chronic lymphedema History of DVT HFpEF (TTE 10/2021) Worsening bilateral R > L edema despite home Lasix .  I do wonder if decreased mobility in recent days has contributed to this. No other symptoms associated with CHF exacerbation. 2023 TTE showed Grade I DD.  RLE is diffusely tender compared to left which is new. Well's DVT = 1. - IV Lasix  40 mg x 1 - Resume home Lasix  20 mg twice daily - RLE US  Doppler  HTN History of SVT Mildly hypertensive ~ 150's. Heart RRR. -Resume home clonidine 0.1 mg twice daily, Cardizem 120 mg daily, losartan 50 mg daily, furosemide  per above - Trend vitals  Depression Increased stress recently as patient's husband underwent CABG 1 month ago.  I do wonder if stress associated with this and her chronic conditions have worsened fibromyalgia symptoms per above. - Continue home Wellbutrin 300 mg daily and Trazodone 100 mg qhs  Memory loss Alert and oriented x 4 - Continue home Aricept  10 mg daily  Hypothyroidism - Continue home Synthroid  50 mcg daily  GERD - Continue home Protonix 40 mg daily  Hypokalemia Mild on admission at 3.4 - KCl 40 mEq x 2 given Lasix  concomitant administration - Check Mg - Trend BMP  Restless leg syndrome - Continue home Requip 1 mg  Diet: Normal VTE: Enoxaparin  IVF: None, Code: Full  Dispo: Admit patient to Observation with expected length of stay less than 2 midnights.  Signed: Marylu Gee, DO Internal Medicine Resident PGY-1 10/11/2023, 12:13 AM

## 2023-10-10 NOTE — ED Notes (Signed)
 Patient given ice chips per request.

## 2023-10-10 NOTE — ED Provider Notes (Signed)
 [place] EMERGENCY DEPARTMENT AT [facility] Provider Note   CSN: [id number] Arrival date & time: 10/10/23  1403     History Chief Complaint  Patient presents with   Back Pain    Lauren Roth is a 78 y.o. female w/ PMHx hypothyroidism, depression, hypertension, chronic pain, degenerative disc L-spine, memory loss, SI pain, restless leg syndrome, osteoarthritis, neuropathy, obesity, fibromyalgia, who presents to the ED for evaluation of low back pain.  She states she got up earlier today and had sudden onset worsening pain in her lower back.  She states it radiates around her abdomen and down her leg.  No no numbness tingling weakness.  No urinary or bowel incontinence.  No fevers chills.  Patient is not on anticoagulation.       Physical Exam Updated Vital Signs BP (!) 156/60   Pulse 74   Temp 98.2 F (36.8 C) (Oral)   Resp 18   Wt 97 kg   LMP  (LMP Unknown)   SpO2 92%   BMI 36.71 kg/m  Physical Exam Vitals and nursing note reviewed.  Constitutional:      General: She is in acute distress.     Appearance: She is well-developed. She is obese.  HENT:     Head: Normocephalic and atraumatic.   Eyes:     Conjunctiva/sclera: Conjunctivae normal.    Cardiovascular:     Rate and Rhythm: Normal rate and regular rhythm.     Pulses: Normal pulses.     Heart sounds: Normal heart sounds. No murmur heard. Pulmonary:     Effort: Pulmonary effort is normal. No respiratory distress.     Breath sounds: Normal breath sounds.  Abdominal:     Palpations: Abdomen is soft.     Tenderness: There is abdominal tenderness.     Comments: Right lower quadrant abdominal tenderness.   Musculoskeletal:     Cervical back: Neck supple.     Comments: Chronic bilateral lower extremity swelling and erythema.  No new changes.  No neurologic deficits diffuse lower back pain tenderness   Skin:    General: Skin is warm and dry.     Capillary Refill: Capillary refill  takes less than 2 seconds.   Neurological:     Mental Status: She is alert.   Psychiatric:        Mood and Affect: Mood normal.     ED Results / Procedures / Treatments   Labs (all labs ordered are listed, but only abnormal results are displayed) Labs Reviewed  COMPREHENSIVE METABOLIC PANEL WITH GFR - Abnormal; Notable for the following components:      Result Value   Potassium 3.4 (*)    Chloride 96 (*)    Glucose, Bld 118 (*)    BUN 6 (*)    Albumin 3.4 (*)    All other components within normal limits  URINALYSIS, ROUTINE W REFLEX MICROSCOPIC - Abnormal; Notable for the following components:   Color, Urine STRAW (*)    Specific Gravity, Urine 1.004 (*)    All other components within normal limits  CBC WITH DIFFERENTIAL/PLATELET    EKG None  Radiology CT Thoracic Spine Wo Contrast Result Date: 10/10/2023 CLINICAL DATA:  Mid back pain EXAM: CT THORACIC SPINE WITHOUT CONTRAST TECHNIQUE: Multidetector CT images of the thoracic were obtained using the standard protocol without intravenous contrast. RADIATION DOSE REDUCTION: This exam was performed according to the departmental dose-optimization program which includes automated exposure control, adjustment of the mA and/or  kV according to patient size and/or use of iterative reconstruction technique. COMPARISON:  None Available. FINDINGS: Alignment: Normal Vertebrae: No acute fracture or focal pathologic process. Paraspinal and other soft tissues: Negative. Disc levels: Early spurring anteriorly.  No disc herniation. IMPRESSION: No acute bony abnormality. Electronically Signed   By: Franky Crease M.D.   On: 10/10/2023 17:25   CT L-SPINE NO CHARGE Result Date: 10/10/2023 CLINICAL DATA:  Mid back pain EXAM: CT LUMBAR SPINE WITHOUT CONTRAST TECHNIQUE: Multidetector CT imaging of the lumbar spine was performed without intravenous contrast administration. Multiplanar CT image reconstructions were also generated. RADIATION DOSE REDUCTION:  This exam was performed according to the departmental dose-optimization program which includes automated exposure control, adjustment of the mA and/or kV according to patient size and/or use of iterative reconstruction technique. COMPARISON:  MRI 07/06/2023 FINDINGS: Segmentation: 5 lumbar type vertebrae. Alignment: Normal Vertebrae: There is slight compression fracture through the superior endplate of L2. Associated sclerosis noted in the superior L2 vertebral body suggests healing, but this is new since MRI 07/06/2023. No additional fracture. No focal suspicious osseous abnormality. Paraspinal and other soft tissues: Negative Disc levels: Broad-based disc bulges noted at L3-4 and L4-5. No focal disc herniation. Mild central canal stenosis at these levels. Diffuse degenerative facet disease. IMPRESSION: Slight compression fracture through the superior endplate of L2. This is age indeterminate, but new since 07/06/2023. Broad-based disc bulges with central canal stenosis at L3-4 and L4-5. Electronically Signed   By: Franky Crease M.D.   On: 10/10/2023 17:24   CT ABDOMEN PELVIS W CONTRAST Result Date: 10/10/2023 CLINICAL DATA:  Right lower quadrant pain EXAM: CT ABDOMEN AND PELVIS WITH CONTRAST TECHNIQUE: Multidetector CT imaging of the abdomen and pelvis was performed using the standard protocol following bolus administration of intravenous contrast. RADIATION DOSE REDUCTION: This exam was performed according to the departmental dose-optimization program which includes automated exposure control, adjustment of the mA and/or kV according to patient size and/or use of iterative reconstruction technique. CONTRAST:  75mL OMNIPAQUE IOHEXOL 350 MG/ML SOLN COMPARISON:  10/17/2020 FINDINGS: Lower chest: No acute abnormality Hepatobiliary: No focal liver abnormality is seen. Status post cholecystectomy. No biliary dilatation. Pancreas: No focal abnormality or ductal dilatation. Fatty replacement. Spleen: No focal  abnormality.  Normal size. Adrenals/Urinary Tract: No adrenal abnormality. No focal renal abnormality. No stones or hydronephrosis. Urinary bladder is unremarkable. Stomach/Bowel: Normal appendix. Stomach, large and small bowel grossly unremarkable. No bowel obstruction or inflammatory process. Vascular/Lymphatic: Aortic atherosclerosis. No evidence of aneurysm or adenopathy. Reproductive: Prior hysterectomy.  No adnexal masses. Other: No free fluid or free air. Musculoskeletal: No acute bony abnormality. IMPRESSION: No acute findings in the abdomen or pelvis. Aortic atherosclerosis. Electronically Signed   By: Franky Crease M.D.   On: 10/10/2023 17:20    Medications Ordered in ED Medications  enoxaparin  (LOVENOX ) injection 40 mg (has no administration in time range)  methocarbamol (ROBAXIN) tablet 500 mg (500 mg Oral Given 10/10/23 1538)  oxyCODONE (Oxy IR/ROXICODONE) immediate release tablet 5 mg (5 mg Oral Given 10/10/23 1538)  HYDROmorphone (DILAUDID) injection 1 mg (1 mg Intravenous Given 10/10/23 1627)  iohexol (OMNIPAQUE) 350 MG/ML injection 75 mL (75 mLs Intravenous Contrast Given 10/10/23 1704)  HYDROmorphone (DILAUDID) injection 1 mg (1 mg Intravenous Given 10/10/23 1817)  diazepam (VALIUM) injection 2.5 mg (2.5 mg Intravenous Given 10/10/23 1817)  ketorolac  (TORADOL ) 15 MG/ML injection 15 mg (15 mg Intravenous Given 10/10/23 1816)    ED Course/ Medical Decision Making/ A&P  Lauren Roth is  a 78 y.o. female presents as detailed above  Differential ddx: Degenerative disc disease, sciatica, fracture, musculoskeletal pain  On arrival, patient afebrile hemodynamically stable no hypoxia or respiratory distress.  Patient crying due to pain.  ED Work-up: Please see details of labs and imaging listed above. Patient received home dose of oxycodone and Robaxin on arrival.   No significant abnormalities on lab workup.  Patient continues to endorse pain.  Patient received Dilaudid and  Toradol .  CT abdomen pelvis without acute intra-abdominal abnormality.  No significant change on CT T-spine.  CT L-spine with new age-indeterminate L2 superior endplate fracture. Patient continues to endorse significant pain received additional Dilaudid and Valium. Neurosurgery consulted.  Does not recommend acute intervention. Patient unable to ambulate complete any ADLs.  Will admit for PT OT pain control    Overall impression L2 compression fracture Patient will be admitted for further evaluation and treatment. Patient seen with supervising physician who agrees with plan.  Final Clinical Impression(s) / ED Diagnoses Final diagnoses:  Acute midline low back pain without sciatica  Closed compression fracture of L2 lumbar vertebra, initial encounter (HCC)     Waddell Seats, DO PGY-3 Emergency Medicine    Seats Waddell, DO 10/10/23 2223    Patt Alm Macho, MD 10/11/23 2016

## 2023-10-11 ENCOUNTER — Observation Stay (HOSPITAL_COMMUNITY)

## 2023-10-11 DIAGNOSIS — R1312 Dysphagia, oropharyngeal phase: Secondary | ICD-10-CM | POA: Diagnosis not present

## 2023-10-11 DIAGNOSIS — M4856XA Collapsed vertebra, not elsewhere classified, lumbar region, initial encounter for fracture: Secondary | ICD-10-CM | POA: Diagnosis not present

## 2023-10-11 DIAGNOSIS — Z8679 Personal history of other diseases of the circulatory system: Secondary | ICD-10-CM | POA: Diagnosis not present

## 2023-10-11 DIAGNOSIS — E669 Obesity, unspecified: Secondary | ICD-10-CM | POA: Diagnosis not present

## 2023-10-11 DIAGNOSIS — Z79899 Other long term (current) drug therapy: Secondary | ICD-10-CM | POA: Diagnosis not present

## 2023-10-11 DIAGNOSIS — M797 Fibromyalgia: Secondary | ICD-10-CM | POA: Diagnosis not present

## 2023-10-11 DIAGNOSIS — M48061 Spinal stenosis, lumbar region without neurogenic claudication: Secondary | ICD-10-CM | POA: Diagnosis not present

## 2023-10-11 DIAGNOSIS — Z8249 Family history of ischemic heart disease and other diseases of the circulatory system: Secondary | ICD-10-CM | POA: Diagnosis not present

## 2023-10-11 DIAGNOSIS — I5032 Chronic diastolic (congestive) heart failure: Secondary | ICD-10-CM | POA: Diagnosis not present

## 2023-10-11 DIAGNOSIS — M51369 Other intervertebral disc degeneration, lumbar region without mention of lumbar back pain or lower extremity pain: Secondary | ICD-10-CM | POA: Diagnosis not present

## 2023-10-11 DIAGNOSIS — M6259 Muscle wasting and atrophy, not elsewhere classified, multiple sites: Secondary | ICD-10-CM | POA: Diagnosis not present

## 2023-10-11 DIAGNOSIS — I10 Essential (primary) hypertension: Secondary | ICD-10-CM | POA: Diagnosis not present

## 2023-10-11 DIAGNOSIS — Z79890 Hormone replacement therapy (postmenopausal): Secondary | ICD-10-CM | POA: Diagnosis not present

## 2023-10-11 DIAGNOSIS — Z87891 Personal history of nicotine dependence: Secondary | ICD-10-CM | POA: Diagnosis not present

## 2023-10-11 DIAGNOSIS — G8929 Other chronic pain: Secondary | ICD-10-CM | POA: Diagnosis not present

## 2023-10-11 DIAGNOSIS — G43909 Migraine, unspecified, not intractable, without status migrainosus: Secondary | ICD-10-CM | POA: Diagnosis not present

## 2023-10-11 DIAGNOSIS — S32020D Wedge compression fracture of second lumbar vertebra, subsequent encounter for fracture with routine healing: Secondary | ICD-10-CM

## 2023-10-11 DIAGNOSIS — I351 Nonrheumatic aortic (valve) insufficiency: Secondary | ICD-10-CM | POA: Diagnosis not present

## 2023-10-11 DIAGNOSIS — E876 Hypokalemia: Secondary | ICD-10-CM | POA: Diagnosis not present

## 2023-10-11 DIAGNOSIS — M546 Pain in thoracic spine: Secondary | ICD-10-CM | POA: Diagnosis not present

## 2023-10-11 DIAGNOSIS — M5136 Other intervertebral disc degeneration, lumbar region with discogenic back pain only: Secondary | ICD-10-CM | POA: Diagnosis not present

## 2023-10-11 DIAGNOSIS — R1031 Right lower quadrant pain: Secondary | ICD-10-CM | POA: Diagnosis not present

## 2023-10-11 DIAGNOSIS — K219 Gastro-esophageal reflux disease without esophagitis: Secondary | ICD-10-CM | POA: Diagnosis not present

## 2023-10-11 DIAGNOSIS — M545 Low back pain, unspecified: Secondary | ICD-10-CM | POA: Diagnosis not present

## 2023-10-11 DIAGNOSIS — Z96653 Presence of artificial knee joint, bilateral: Secondary | ICD-10-CM | POA: Diagnosis not present

## 2023-10-11 DIAGNOSIS — R2681 Unsteadiness on feet: Secondary | ICD-10-CM | POA: Diagnosis not present

## 2023-10-11 DIAGNOSIS — S32020A Wedge compression fracture of second lumbar vertebra, initial encounter for closed fracture: Secondary | ICD-10-CM | POA: Diagnosis present

## 2023-10-11 DIAGNOSIS — M51379 Other intervertebral disc degeneration, lumbosacral region without mention of lumbar back pain or lower extremity pain: Secondary | ICD-10-CM | POA: Diagnosis not present

## 2023-10-11 DIAGNOSIS — I890 Lymphedema, not elsewhere classified: Secondary | ICD-10-CM | POA: Diagnosis not present

## 2023-10-11 DIAGNOSIS — M7989 Other specified soft tissue disorders: Secondary | ICD-10-CM

## 2023-10-11 DIAGNOSIS — F32A Depression, unspecified: Secondary | ICD-10-CM | POA: Diagnosis not present

## 2023-10-11 DIAGNOSIS — E039 Hypothyroidism, unspecified: Secondary | ICD-10-CM | POA: Diagnosis not present

## 2023-10-11 DIAGNOSIS — I110 Hypertensive heart disease with heart failure: Secondary | ICD-10-CM | POA: Diagnosis not present

## 2023-10-11 DIAGNOSIS — G2581 Restless legs syndrome: Secondary | ICD-10-CM | POA: Diagnosis not present

## 2023-10-11 DIAGNOSIS — M25512 Pain in left shoulder: Secondary | ICD-10-CM | POA: Diagnosis not present

## 2023-10-11 DIAGNOSIS — F329 Major depressive disorder, single episode, unspecified: Secondary | ICD-10-CM | POA: Diagnosis not present

## 2023-10-11 DIAGNOSIS — I700 Atherosclerosis of aorta: Secondary | ICD-10-CM | POA: Diagnosis not present

## 2023-10-11 DIAGNOSIS — Z951 Presence of aortocoronary bypass graft: Secondary | ICD-10-CM | POA: Diagnosis not present

## 2023-10-11 DIAGNOSIS — R413 Other amnesia: Secondary | ICD-10-CM | POA: Diagnosis not present

## 2023-10-11 DIAGNOSIS — G894 Chronic pain syndrome: Secondary | ICD-10-CM | POA: Diagnosis not present

## 2023-10-11 DIAGNOSIS — Z9049 Acquired absence of other specified parts of digestive tract: Secondary | ICD-10-CM | POA: Diagnosis not present

## 2023-10-11 LAB — BASIC METABOLIC PANEL WITH GFR
Anion gap: 6 (ref 5–15)
BUN: 5 mg/dL — ABNORMAL LOW (ref 8–23)
CO2: 31 mmol/L (ref 22–32)
Calcium: 8.5 mg/dL — ABNORMAL LOW (ref 8.9–10.3)
Chloride: 100 mmol/L (ref 98–111)
Creatinine, Ser: 0.9 mg/dL (ref 0.44–1.00)
GFR, Estimated: >60 mL/min (ref >60)
Glucose, Bld: 122 mg/dL — ABNORMAL HIGH (ref 70–99)
Potassium: 4.1 mmol/L (ref 3.5–5.1)
Sodium: 137 mmol/L (ref 135–145)

## 2023-10-11 LAB — MAGNESIUM: Magnesium: 2 mg/dL (ref 1.7–2.4)

## 2023-10-11 MED ORDER — FUROSEMIDE 10 MG/ML IJ SOLN
40.0000 mg | Freq: Once | INTRAMUSCULAR | Status: AC
  Administered 2023-10-11: 40 mg via INTRAVENOUS
  Filled 2023-10-11: qty 4

## 2023-10-11 MED ORDER — POTASSIUM CHLORIDE CRYS ER 20 MEQ PO TBCR
40.0000 meq | EXTENDED_RELEASE_TABLET | Freq: Once | ORAL | Status: AC
  Administered 2023-10-11: 40 meq via ORAL
  Filled 2023-10-11: qty 2

## 2023-10-11 NOTE — TOC Initial Note (Addendum)
 Transition of Care [facility]) - Initial/Assessment Note    Patient Details  Name: Lauren Roth MRN: [id number] Date of Birth: [birth year]  Transition of Care [facility]) CM/SW Contact:    [name], LCSW Phone Number: 10/11/2023, 10:08 AM  Clinical Narrative:           10:08 AM CSW introduced self and role to patient. Patient's daughter was also present. Patient consented CSW to speak in front of daughter. Patient confirmed that she resides at home with spouse. CSW informed patient and patient's daughter of physical therapy recommendation of patient discharging to SNF. Patient and patient's daughter were agreeable with recommendation. Patient and patient's daughter consented CSW to send referrals to SNFs within [facility]. Patient confirmed HH history with [facility] and expressed interest in working with [facility] again if progressed enough to no longer needing SNF at discharge. Per chart review, patient has a PCP and insurance but not DME/SNF history. Per chart review, patient's preferred pharmacy is [facility] [address]. CSW sent patient's referrals to SNFs within [facility].  3:42 PM CSW provided patient current SNF options ([facility], [facility], [facility], [facility]) with their quarterly Medicare ratings.  Expected Discharge Plan: Skilled Nursing Facility Barriers to Discharge: Continued Medical Work up, English as a second language teacher, SNF Pending bed offer   Patient Goals and CMS Choice Patient states their goals for this hospitalization and ongoing recovery are:: SNF          Expected Discharge Plan and Services In-house Referral: Clinical Social Work   Post Acute Care Choice: Skilled Nursing Facility Living arrangements for the past 2 months: Single Family Home                                      Prior Living Arrangements/Services Living arrangements for the past 2 months: Single Family Home Lives with:: Spouse Patient  language and need for interpreter reviewed:: Yes              Criminal Activity/Legal Involvement Pertinent to Current Situation/Hospitalization: No - Comment as needed  Activities of Daily Living   ADL Screening (condition at time of admission) Independently performs ADLs?: Yes (appropriate for developmental age) Is the patient deaf or have difficulty hearing?: No Does the patient have difficulty seeing, even when wearing glasses/contacts?: Yes Does the patient have difficulty concentrating, remembering, or making decisions?: No  Permission Sought/Granted Permission sought to share information with : Family Supports, Oceanographer granted to share information with : No (Contact information on chart)  Share Information with NAME: Fran Neiswonger  Permission granted to share info w AGENCY: SNF  Permission granted to share info w Relationship: Spouse  Permission granted to share info w Contact Information: [phone]  Emotional Assessment Appearance:: Appears stated age Attitude/Demeanor/Rapport: Engaged Affect (typically observed): Accepting, Appropriate, Adaptable, Calm, Stable, Pleasant Orientation: : Oriented to Self, Oriented to Place, Oriented to  Time, Oriented to Situation Alcohol / Substance Use: Not Applicable Psych Involvement: No (comment)  Admission diagnosis:  Compression fracture of L2 (HCC) [S32.020A] Closed compression fracture of L2 lumbar vertebra, initial encounter (HCC) [S32.020A] Acute midline low back pain without sciatica [M54.50] Patient Active Problem List   Diagnosis Date Noted   Hypokalemia 10/11/2023   Compression fracture of L2 (HCC) 10/10/2023   Chronic diarrhea 09/01/2023   Chronic thumb pain, right 09/01/2023   Rib fracture 08/22/2023   Simple chronic bronchitis (HCC) 08/16/2023  Obesity, morbid (HCC) 08/16/2023   Candidal intertrigo 07/18/2023   Abnormality of gait and mobility 06/29/2023   Left flank pain  05/25/2023   Bruised rib, left, sequela 05/25/2023   Hyperkalemia 05/25/2023   Lumbar spondylosis 04/21/2023   Right hand pain 04/03/2023   Pedal edema 04/03/2023   Acute non-recurrent frontal sinusitis 02/07/2023   Encounter for immunization 02/06/2023   Other chest pain 02/06/2023   Prediabetes 02/05/2023   Mixed hyperlipidemia 02/05/2023   Corns of multiple toes 01/11/2023   Toe pain, left 01/11/2023   Toe pain, right 01/11/2023   GAD (generalized anxiety disorder) 12/08/2022   SVT (supraventricular tachycardia) (HCC) 08/25/2022   Tremor 12/29/2021   Obesity (BMI 30.0-34.9) 10/13/2021   Chronic pain of right knee 06/12/2021   Chronic heart failure with preserved ejection fraction (HCC) 05/08/2021   Heart failure (HCC) 05/08/2021   Lymphedema 02/10/2021   Neuropathy 03/10/2020   Thyroid  disease    Left hip pain 05/24/2019   Moderate aortic regurgitation 05/21/2019   Pre-operative cardiovascular examination 11/25/2017   Essential thrombocytosis (HCC) 11/25/2017   Palpitations 01/28/2017   Shortness of breath on exertion 01/28/2017   SI (sacroiliac) pain 05/10/2016   Primary osteoarthritis of right knee 10/06/2014   Closed fracture of left tibia 10/06/2014   History of total knee arthroplasty 10/06/2014   Thrombocythemia, essential (HCC) 09/12/2013   Uterine cancer (HCC) 07/30/2013   TMJ (dislocation of temporomandibular joint) 07/30/2013   Pain in thoracic spine 07/30/2013   Chronic pain syndrome 07/30/2013   Arthritis 07/30/2013   Disorder of sacrum 07/30/2013   Arthropathy 07/30/2013   Cervical post-laminectomy syndrome 07/30/2013   Pain in soft tissues of limb 06/20/2012   Thoracic spondylosis 11/20/2010   Lumbosacral spondylosis 10/09/2010   Restless legs syndrome 08/25/2010   Medication management 08/25/2010   Fibromyalgia 08/25/2010   Hypothyroidism 01/16/2009   DEPRESSION 01/16/2009   Essential hypertension 01/16/2009   Allergic rhinitis 01/16/2009   GERD  01/16/2009   Menopausal and postmenopausal disorder 01/16/2009   DEGENERATIVE DISC DISEASE, LUMBOSACRAL SPINE 01/16/2009   DIAPHORESIS 01/16/2009   Memory loss 01/16/2009   Edema 01/16/2009   Diaphoresis 01/16/2009   Depression 01/16/2009   PCP:  Sherre Clapper, MD Pharmacy:   [facility] [address] [place] - [address] [place] [address] Phone: [phone] Fax: [phone]     Social Drivers of Health (SDOH) Social History: SDOH Screenings   Food Insecurity: No Food Insecurity (10/11/2023)  Housing: Low Risk  (10/11/2023)  Transportation Needs: No Transportation Needs (10/11/2023)  Utilities: Not At Risk (10/11/2023)  Depression (PHQ2-9): Low Risk  (07/18/2023)  Financial Resource Strain: Low Risk  (03/21/2023)  Physical Activity: Inactive (03/21/2023)  Social Connections: Socially Integrated (10/11/2023)  Stress: No Stress Concern Present (03/21/2023)  Tobacco Use: Medium Risk (10/10/2023)  Health Literacy: Adequate Health Literacy (03/21/2023)   SDOH Interventions:     Readmission Risk Interventions     No data to display

## 2023-10-11 NOTE — Progress Notes (Signed)
 BLE venous duplex has been completed.   Results can be found under chart review under CV PROC. 10/11/2023 4:49 PM Daryel Kenneth RVT, RDMS

## 2023-10-11 NOTE — Evaluation (Addendum)
 Physical Therapy Evaluation Patient Details Name: Lauren Roth MRN: [id number] DOB: 1945/06/22 Today's Date: 10/11/2023  History of Present Illness  Pt is a 78 y.o. female admitted 6/23 with c/o LBP. Imaging revealed mild L2 compression fx. Pt also with c/o L shoulder pain. Imaging negative. PMH:  lumbosacral DDD, chronic pain syndrome, fibromyalgia, HTN, SVT, DVT, chronic lymphedema, HFpEF (TTE, 10/2022), hypothyroidism, GERD, memory loss, depression, migraines, neuropathy, obesity, restless leg syndrome   Clinical Impression  Pt admitted with above diagnosis. PTA pt lived at home with her husband, mod I with rollator. Pt currently with functional limitations due to the deficits listed below (see PT Problem List). On eval, pt required mod assist bed mobility, min assist sit to stand, and min assist sidestepping with RW. Mobility limited by pain. Pt reporting multi pain sites: L shoulder, low back, and R distal LE/ankle. She demo fair sitting balance and poor standing balance. Pt will benefit from acute skilled PT to increase their independence and safety with mobility to allow discharge. Pt's husband recently underwent CABG so he is unable to provide physical assist at home. Pt's children do not live locally and can only provide intermittent assist. Post acute, pt would benefit from further therapy in inpatient setting < 3 hours/day.           If plan is discharge home, recommend the following: A lot of help with walking and/or transfers;A lot of help with bathing/dressing/bathroom   Can travel by private vehicle   No    Equipment Recommendations None recommended by PT  Recommendations for Other Services       Functional Status Assessment Patient has had a recent decline in their functional status and demonstrates the ability to make significant improvements in function in a reasonable and predictable amount of time.     Precautions / Restrictions Precautions Precautions:  Fall Recall of Precautions/Restrictions: Intact      Mobility  Bed Mobility Overal bed mobility: Needs Assistance Bed Mobility: Rolling, Sidelying to Sit, Sit to Sidelying Rolling: Mod assist Sidelying to sit: Mod assist, HOB elevated     Sit to sidelying: Mod assist General bed mobility comments: increased time, cues for logroll, assist with BLE and trunk    Transfers Overall transfer level: Needs assistance Equipment used: Rolling walker (2 wheels) Transfers: Sit to/from Stand Sit to Stand: Min assist, From elevated surface           General transfer comment: increased time, assist to power up    Ambulation/Gait Ambulation/Gait assistance: Min assist   Assistive device: Rolling walker (2 wheels)         General Gait Details: sidestepping bedside. Limited by pain. Pt reports BLE feel unsteady.  Stairs            Wheelchair Mobility     Tilt Bed    Modified Rankin (Stroke Patients Only)       Balance Overall balance assessment: Needs assistance Sitting-balance support: No upper extremity supported, Feet supported Sitting balance-Leahy Scale: Fair     Standing balance support: Bilateral upper extremity supported, Reliant on assistive device for balance, During functional activity Standing balance-Leahy Scale: Poor                               Pertinent Vitals/Pain Pain Assessment Pain Assessment: 0-10 Pain Score: 8  Pain Location: low back, RLE Pain Descriptors / Indicators: Guarding, Grimacing, Sore Pain Intervention(s): Limited activity within patient's tolerance,  Monitored during session, Patient requesting pain meds-RN notified    Home Living Family/patient expects to be discharged to:: Private residence Living Arrangements: Spouse/significant other Available Help at Discharge: Family;Available PRN/intermittently Type of Home: House Home Access: Level entry       Home Layout: Laundry or work area in basement Home  Equipment: Rollator (4 wheels);Cane - single point;Tub bench;Shower seat;BSC/3in1;Grab bars - tub/shower;Wheelchair - manual Additional Comments: Pt's husband underwent CABG approx 1 week ago.  Pt started attended [facility] approx 2 years ago which improved her mobility. Prior to that, pt was very sendentary for 10 years or so. Only ambulating short household distances with rollator and dependent w/c mobility in community.    Prior Function Prior Level of Function : Independent/Modified Independent;History of Falls (last six months)             Mobility Comments: Pt has been amb with rollator x 1 month. Independent without AD prior to that time. ADLs Comments: mod I     Extremity/Trunk Assessment   Upper Extremity Assessment Upper Extremity Assessment: Defer to OT evaluation    Lower Extremity Assessment Lower Extremity Assessment: Generalized weakness;RLE deficits/detail;LLE deficits/detail RLE Deficits / Details: lymphedema- red and swollen LLE Deficits / Details: lymphedema- red and swollen       Communication   Communication Communication: No apparent difficulties    Cognition Arousal: Alert Behavior During Therapy: WFL for tasks assessed/performed   PT - Cognitive impairments: No apparent impairments                         Following commands: Intact       Cueing Cueing Techniques: Verbal cues, Gestural cues     General Comments      Exercises     Assessment/Plan    PT Assessment Patient needs continued PT services  PT Problem List Decreased strength;Pain;Decreased activity tolerance;Decreased balance;Decreased mobility       PT Treatment Interventions DME instruction;Therapeutic exercise;Gait training;Balance training;Functional mobility training;Therapeutic activities;Patient/family education    PT Goals (Current goals can be found in the Care Plan section)  Acute Rehab PT Goals Patient Stated Goal: decrease pain PT Goal  Formulation: With patient Time For Goal Achievement: 10/25/23 Potential to Achieve Goals: Good    Frequency Min 2X/week     Co-evaluation               AM-PAC PT 6 Clicks Mobility  Outcome Measure Help needed turning from your back to your side while in a flat bed without using bedrails?: A Lot Help needed moving from lying on your back to sitting on the side of a flat bed without using bedrails?: A Lot Help needed moving to and from a bed to a chair (including a wheelchair)?: A Little Help needed standing up from a chair using your arms (e.g., wheelchair or bedside chair)?: A Little Help needed to walk in hospital room?: A Lot Help needed climbing 3-5 steps with a railing? : Total 6 Click Score: 13    End of Session Equipment Utilized During Treatment: Gait belt Activity Tolerance: Patient limited by pain Patient left: in bed;with call bell/phone within reach;with family/visitor present Nurse Communication: Patient requests pain meds PT Visit Diagnosis: Other abnormalities of gait and mobility (R26.89);Pain;Muscle weakness (generalized) (M62.81)    Time: 9172-9149 PT Time Calculation (min) (ACUTE ONLY): 23 min   Charges:   PT Evaluation $PT Eval Moderate Complexity: 1 Mod   PT General Charges $$ ACUTE PT  VISIT: 1 Visit         Sari MATSU., PT  Office # [phone]   Erven Sari Shaker 10/11/2023, 9:18 AM

## 2023-10-11 NOTE — Progress Notes (Signed)
 PROGRESS NOTE    Lauren Roth  FMW:[id number] DOB: [birth year] DOA: 10/10/2023 PCP: Sherre Clapper, MD   Brief Narrative: This 78 year old female with PMH significant for lumbosacral DDD, Chronic pain syndrome, fibromyalgia, HTN, SVT, DVT, chronic lymphedema, HFpEF (TTE, 10/2022), hypothyroidism, GERD, memory loss, depression, migraines, Neuropathy, obesity, restless leg syndrome who presented in the ED with acute worsening of low back pain.  Patient stood up this morning and has developed sharp 10 x 10 stabbing left back pain with radiation to the right leg to the foot.  Patient denies urinary bowel incontinence and perineal numbness She also reports worsening bilateral lower extremity swelling despite taking normal dose of Lasix . Denies SOB, orthopnea, PND, chest pain/palpitations.  She also states that 5 days ago she began having bilateral symmetrical shoulder, hip, and ankle pain that all occurred at once.  Patient was admitted for further evaluation.  Assessment & Plan:   Principal Problem:   Compression fracture of L2 (HCC) Active Problems:   Hypothyroidism   Essential hypertension   GERD   Memory loss   Chronic pain syndrome   Depression   Fibromyalgia   Hypokalemia  Compression Fracture of L2 Fibromyalgia / Chronic pain syndrome: Lumbosacral DDD: Patient presented with 10/10 lower back pain with radiation to right leg to foot.  CT L-spine shows compression fracture through superior endplate of L2, age-indeterminate but new since 06/2023.   Neurosurgery consulted in ED who did not see need for further workup with recommendation to follow up with her spine doctor.  Continue adequate pain control with pain medicines.   Review of records show extensive history of chronic back pain. Patient follows [facility] and was seen 4/16 for low back and right knee pain.  She has also seen orthopedic surgeon, Rajbir Dennise Mantis, MD, in 2024 who recommended  non-operative management.  She has received SI and lumbosacral steroid injections with minimal relief.  This seems to be a chronic issue with acute worsening back pain. Unclear etiology of bilateral symmetrical shoulder, hip, and ankle pain given that it all began acutely a few days ago.   Chronic pain syndrome/fibromyalgia could be contributing.  However, on exam left shoulder pain is markedly more severe.  No weakness.  No precipitating illness or vaccination. Continue home Percocet 5-325 every 4 as needed, Zanaflex 4 mg 3 times daily as needed, Lyrica 75 mg twice daily, Cymbalta 60 mg daily. Continue IV Dilaudid 0.5 mg every 4 as needed for breakthrough pain. PT/OT   Chronic lymphedema: History of DVT HFpEF (TTE 10/2021) Worsening bilateral R > L edema despite home Lasix .   Decreased mobility in recent days has contributed to this. No other symptoms associated with CHF exacerbation.  2023 TTE showed Grade I DD.  RLE is diffusely tender compared to left which is new.  Well's DVT = 1. Resume home Lasix  20 mg twice daily Obtain RLE US  Doppler   HTN: History of SVT Mildly hypertensive ~ 150's. Heart RRR. Resume home clonidine 0.1 mg twice daily, Cardizem 120 mg daily, losartan 50 mg daily, furosemide  per above Trend vitals   Depression Increased stress recently as patient's husband underwent CABG 1 month ago.   Continue home Wellbutrin 300 mg daily and Trazodone 100 mg qhs   Memory loss: Alert and oriented x 4. Continue home Aricept  10 mg daily   Hypothyroidism: Continue Synthroid  50 mcg daily   GERD Continue Protonix 40 mg daily   Hypokalemia: Replaced. Continue to monitor.   Restless  leg syndrome: Continue home Requip 1 mg daily.   DVT prophylaxis: Lovenox  Code Status: Full code Family Communication:No family at bed side Disposition Plan:    Status is: Observation The patient remains OBS appropriate and will d/c before 2 midnights.   Patient admitted for acute on  chronic lumbosacral back pain.  Consultants:  Neurosurgery  Procedures: Antimicrobials:  Anti-infectives (From admission, onward)    None      Subjective: Patient was seen and examined at bedside.  Overnight events noted. Patient reports pain is now reasonably controlled,  when she moves pain gets worse. She has follow-up appointment with the spine doctor and the pain management at [facility].  Objective: Vitals:   10/11/23 0415 10/11/23 0500 10/11/23 0600 10/11/23 0819  BP: (!) 99/49 (!) 93/45 (!) 103/52 124/77  Pulse: (!) 54 (!) 51 (!) 47 60  Resp: 16  16   Temp: 98 F (36.7 C)   (!) 97.4 F (36.3 C)  TempSrc: Oral     SpO2: 96% 96% 97% 95%  Weight:      Height:       No intake or output data in the 24 hours ending 10/11/23 1223 Filed Weights   10/10/23 1453 10/11/23 0100  Weight: 97 kg 97 kg    Examination:  General exam: Appears calm and comfortable, deconditioned, not in any acute distress. Respiratory system: Clear to auscultation. Respiratory effort normal.  RR 16 Cardiovascular system: S1 & S2 heard, RRR. No JVD, murmurs, rubs, gallops or clicks.  Gastrointestinal system: Abdomen is non distended, soft and nontender.  Normal bowel sounds heard. Central nervous system: Alert and oriented x 3. No focal neurological deficits. Extremities: Bilateral lymphedema, swelling noted. Skin: No rashes, lesions or ulcers Psychiatry: Judgement and insight appear normal. Mood & affect appropriate.     Data Reviewed: I have personally reviewed following labs and imaging studies  CBC: Recent Labs  Lab 10/10/23 1459  WBC 6.7  NEUTROABS 3.4  HGB 12.2  HCT 37.1  MCV 90.9  PLT 284   Basic Metabolic Panel: Recent Labs  Lab 10/10/23 1459 10/11/23 0548  NA 139 137  K 3.4* 4.1  CL 96* 100  CO2 30 31  GLUCOSE 118* 122*  BUN 6* 5*  CREATININE 0.97 0.90  CALCIUM 9.0 8.5*  MG  --  2.0   GFR: Estimated Creatinine Clearance: 60.1 mL/min (by C-G formula based on SCr  of 0.9 mg/dL). Liver Function Tests: Recent Labs  Lab 10/10/23 1459  AST 26  ALT 20  ALKPHOS 63  BILITOT 0.5  PROT 6.5  ALBUMIN 3.4*   No results for input(s): LIPASE, AMYLASE in the last 168 hours. No results for input(s): AMMONIA in the last 168 hours. Coagulation Profile: No results for input(s): INR, PROTIME in the last 168 hours. Cardiac Enzymes: No results for input(s): CKTOTAL, CKMB, CKMBINDEX, TROPONINI in the last 168 hours. BNP (last 3 results) No results for input(s): PROBNP in the last 8760 hours. HbA1C: No results for input(s): HGBA1C in the last 72 hours. CBG: No results for input(s): GLUCAP in the last 168 hours. Lipid Profile: No results for input(s): CHOL, HDL, LDLCALC, TRIG, CHOLHDL, LDLDIRECT in the last 72 hours. Thyroid  Function Tests: No results for input(s): TSH, T4TOTAL, FREET4, T3FREE, THYROIDAB in the last 72 hours. Anemia Panel: No results for input(s): VITAMINB12, FOLATE, FERRITIN, TIBC, IRON, RETICCTPCT in the last 72 hours. Sepsis Labs: No results for input(s): PROCALCITON, LATICACIDVEN in the last 168 hours.  No results found for  this or any previous visit (from the past 240 hours).   Radiology Studies: DG Shoulder Left Result Date: 10/11/2023 CLINICAL DATA:  Left shoulder pain EXAM: LEFT SHOULDER - 2+ VIEW COMPARISON:  11/18/2018 FINDINGS: There is no evidence of fracture or dislocation. There is no evidence of arthropathy or other focal bone abnormality. Soft tissues are unremarkable. Changes of prior fixation in the mid humerus are seen. IMPRESSION: No acute abnormality noted. Electronically Signed   By: Oneil Devonshire M.D.   On: 10/11/2023 01:16   CT Thoracic Spine Wo Contrast Result Date: 10/10/2023 CLINICAL DATA:  Mid back pain EXAM: CT THORACIC SPINE WITHOUT CONTRAST TECHNIQUE: Multidetector CT images of the thoracic were obtained using the standard protocol without intravenous  contrast. RADIATION DOSE REDUCTION: This exam was performed according to the departmental dose-optimization program which includes automated exposure control, adjustment of the mA and/or kV according to patient size and/or use of iterative reconstruction technique. COMPARISON:  None Available. FINDINGS: Alignment: Normal Vertebrae: No acute fracture or focal pathologic process. Paraspinal and other soft tissues: Negative. Disc levels: Early spurring anteriorly.  No disc herniation. IMPRESSION: No acute bony abnormality. Electronically Signed   By: Franky Crease M.D.   On: 10/10/2023 17:25   CT L-SPINE NO CHARGE Result Date: 10/10/2023 CLINICAL DATA:  Mid back pain EXAM: CT LUMBAR SPINE WITHOUT CONTRAST TECHNIQUE: Multidetector CT imaging of the lumbar spine was performed without intravenous contrast administration. Multiplanar CT image reconstructions were also generated. RADIATION DOSE REDUCTION: This exam was performed according to the departmental dose-optimization program which includes automated exposure control, adjustment of the mA and/or kV according to patient size and/or use of iterative reconstruction technique. COMPARISON:  MRI 07/06/2023 FINDINGS: Segmentation: 5 lumbar type vertebrae. Alignment: Normal Vertebrae: There is slight compression fracture through the superior endplate of L2. Associated sclerosis noted in the superior L2 vertebral body suggests healing, but this is new since MRI 07/06/2023. No additional fracture. No focal suspicious osseous abnormality. Paraspinal and other soft tissues: Negative Disc levels: Broad-based disc bulges noted at L3-4 and L4-5. No focal disc herniation. Mild central canal stenosis at these levels. Diffuse degenerative facet disease. IMPRESSION: Slight compression fracture through the superior endplate of L2. This is age indeterminate, but new since 07/06/2023. Broad-based disc bulges with central canal stenosis at L3-4 and L4-5. Electronically Signed   By:  Franky Crease M.D.   On: 10/10/2023 17:24   CT ABDOMEN PELVIS W CONTRAST Result Date: 10/10/2023 CLINICAL DATA:  Right lower quadrant pain EXAM: CT ABDOMEN AND PELVIS WITH CONTRAST TECHNIQUE: Multidetector CT imaging of the abdomen and pelvis was performed using the standard protocol following bolus administration of intravenous contrast. RADIATION DOSE REDUCTION: This exam was performed according to the departmental dose-optimization program which includes automated exposure control, adjustment of the mA and/or kV according to patient size and/or use of iterative reconstruction technique. CONTRAST:  75mL OMNIPAQUE IOHEXOL 350 MG/ML SOLN COMPARISON:  10/17/2020 FINDINGS: Lower chest: No acute abnormality Hepatobiliary: No focal liver abnormality is seen. Status post cholecystectomy. No biliary dilatation. Pancreas: No focal abnormality or ductal dilatation. Fatty replacement. Spleen: No focal abnormality.  Normal size. Adrenals/Urinary Tract: No adrenal abnormality. No focal renal abnormality. No stones or hydronephrosis. Urinary bladder is unremarkable. Stomach/Bowel: Normal appendix. Stomach, large and small bowel grossly unremarkable. No bowel obstruction or inflammatory process. Vascular/Lymphatic: Aortic atherosclerosis. No evidence of aneurysm or adenopathy. Reproductive: Prior hysterectomy.  No adnexal masses. Other: No free fluid or free air. Musculoskeletal: No acute bony abnormality. IMPRESSION: No acute  findings in the abdomen or pelvis. Aortic atherosclerosis. Electronically Signed   By: Franky Crease M.D.   On: 10/10/2023 17:20   Scheduled Meds:  aspirin  81 mg Oral Daily   buPROPion  300 mg Oral Daily   cloNIDine  0.1 mg Oral BID   diltiazem  120 mg Oral Daily   donepezil   10 mg Oral Daily   DULoxetine  60 mg Oral Daily   enoxaparin  (LOVENOX ) injection  40 mg Subcutaneous Q24H   finasteride  5 mg Oral Daily   furosemide   20 mg Oral BID   levothyroxine   50 mcg Oral Q0600   losartan  50 mg  Oral Daily   pantoprazole  40 mg Oral Daily   pregabalin  75 mg Oral BID   rOPINIRole  1 mg Oral Daily   traZODone  100 mg Oral QHS   Continuous Infusions:   LOS: 0 days    Time spent: 50 mins    Darcel Dawley, MD [facility]   If 7PM-7AM, please contact night-coverage

## 2023-10-11 NOTE — Plan of Care (Signed)
  Problem: Education: Goal: Knowledge of General Education information will improve Description: Including pain rating scale, medication(s)/side effects and non-pharmacologic comfort measures Outcome: Progressing   Problem: Health Behavior/Discharge Planning: Goal: Ability to manage health-related needs will improve Outcome: Progressing   Problem: Clinical Measurements: Goal: Ability to maintain clinical measurements within normal limits will improve Outcome: Progressing Goal: Will remain free from infection Outcome: Progressing Goal: Diagnostic test results will improve Outcome: Progressing Goal: Respiratory complications will improve Outcome: Progressing Goal: Cardiovascular complication will be avoided Outcome: Progressing   Problem: Activity: Goal: Risk for activity intolerance will decrease Outcome: Progressing   Problem: Pain Managment: Goal: General experience of comfort will improve and/or be controlled Outcome: Progressing   Problem: Safety: Goal: Ability to remain free from injury will improve Outcome: Progressing

## 2023-10-11 NOTE — NC FL2 (Signed)
 [place]  MEDICAID FL2 LEVEL OF CARE FORM     IDENTIFICATION  Patient Name: Lauren Roth Birthdate: December 18, 1945 Sex: female Admission Date (Current Location): 10/10/2023  [facility] and [place] Number:  [employer] and Address:  [facility] [place], [address], [place], [place] [address]      Provider Number: [id number]  Attending Physician Name and Address:  Leotis Bogus, MD  Relative Name and Phone Number:  Amorina Doerr; Spouse; [phone]    Current Level of Care: Hospital Recommended Level of Care: Skilled Nursing Facility Prior Approval Number:    Date Approved/Denied:   PASRR Number: [id number]  Discharge Plan: SNF    Current Diagnoses: Patient Active Problem List   Diagnosis Date Noted   Hypokalemia 10/11/2023   Compression fracture of L2 (HCC) 10/10/2023   Chronic diarrhea 09/01/2023   Chronic thumb pain, right 09/01/2023   Rib fracture 08/22/2023   Simple chronic bronchitis (HCC) 08/16/2023   Obesity, morbid (HCC) 08/16/2023   Candidal intertrigo 07/18/2023   Abnormality of gait and mobility 06/29/2023   Left flank pain 05/25/2023   Bruised rib, left, sequela 05/25/2023   Hyperkalemia 05/25/2023   Lumbar spondylosis 04/21/2023   Right hand pain 04/03/2023   Pedal edema 04/03/2023   Acute non-recurrent frontal sinusitis 02/07/2023   Encounter for immunization 02/06/2023   Other chest pain 02/06/2023   Prediabetes 02/05/2023   Mixed hyperlipidemia 02/05/2023   Corns of multiple toes 01/11/2023   Toe pain, left 01/11/2023   Toe pain, right 01/11/2023   GAD (generalized anxiety disorder) 12/08/2022   SVT (supraventricular tachycardia) (HCC) 08/25/2022   Tremor 12/29/2021   Obesity (BMI 30.0-34.9) 10/13/2021   Chronic pain of right knee 06/12/2021   Chronic heart failure with preserved ejection fraction (HCC) 05/08/2021   Heart failure (HCC) 05/08/2021   Lymphedema 02/10/2021   Neuropathy 03/10/2020    Thyroid  disease    Left hip pain 05/24/2019   Moderate aortic regurgitation 05/21/2019   Pre-operative cardiovascular examination 11/25/2017   Essential thrombocytosis (HCC) 11/25/2017   Palpitations 01/28/2017   Shortness of breath on exertion 01/28/2017   SI (sacroiliac) pain 05/10/2016   Primary osteoarthritis of right knee 10/06/2014   Closed fracture of left tibia 10/06/2014   History of total knee arthroplasty 10/06/2014   Thrombocythemia, essential (HCC) 09/12/2013   Uterine cancer (HCC) 07/30/2013   TMJ (dislocation of temporomandibular joint) 07/30/2013   Pain in thoracic spine 07/30/2013   Chronic pain syndrome 07/30/2013   Arthritis 07/30/2013   Disorder of sacrum 07/30/2013   Arthropathy 07/30/2013   Cervical post-laminectomy syndrome 07/30/2013   Pain in soft tissues of limb 06/20/2012   Thoracic spondylosis 11/20/2010   Lumbosacral spondylosis 10/09/2010   Restless legs syndrome 08/25/2010   Medication management 08/25/2010   Fibromyalgia 08/25/2010   Hypothyroidism 01/16/2009   DEPRESSION 01/16/2009   Essential hypertension 01/16/2009   Allergic rhinitis 01/16/2009   GERD 01/16/2009   Menopausal and postmenopausal disorder 01/16/2009   DEGENERATIVE DISC DISEASE, LUMBOSACRAL SPINE 01/16/2009   DIAPHORESIS 01/16/2009   Memory loss 01/16/2009   Edema 01/16/2009   Diaphoresis 01/16/2009   Depression 01/16/2009    Orientation RESPIRATION BLADDER Height & Weight     Self, Time, Situation, Place  Normal (Room Air) Continent Weight: 213 lb 13.5 oz (97 kg) Height:  5' 4 (162.6 cm)  BEHAVIORAL SYMPTOMS/MOOD NEUROLOGICAL BOWEL NUTRITION STATUS      Continent Diet (Please see discharge summary)  AMBULATORY STATUS COMMUNICATION OF NEEDS Skin  Extensive Assist Verbally Normal                       Personal Care Assistance Level of Assistance  Bathing, Dressing, Feeding Bathing Assistance: Maximum assistance Feeding assistance: Maximum assistance Dressing  Assistance: Maximum assistance     Functional Limitations Info             SPECIAL CARE FACTORS FREQUENCY  PT (By licensed PT), OT (By licensed OT)     PT Frequency: 5x OT Frequency: 5x            Contractures Contractures Info: Not present    Additional Factors Info  Code Status, Allergies Code Status Info: Full Code Allergies Info: Cottonseed Oil and Codeine Sulfate           Current Medications (10/11/2023):  This is the current hospital active medication list Current Facility-Administered Medications  Medication Dose Route Frequency Provider Last Rate Last Admin   aspirin chewable tablet 81 mg  81 mg Oral Daily Marylu Gee, DO   81 mg at 10/11/23 9056   buPROPion (WELLBUTRIN XL) 24 hr tablet 300 mg  300 mg Oral Daily Marylu Gee, DO   300 mg at 10/11/23 0945   cloNIDine (CATAPRES) tablet 0.1 mg  0.1 mg Oral BID Ingold, Tyler, DO   0.1 mg at 10/11/23 9056   diltiazem (CARDIZEM CD) 24 hr capsule 120 mg  120 mg Oral Daily Ingold, Tyler, DO       donepezil  (ARICEPT ) tablet 10 mg  10 mg Oral Daily Marylu Gee, DO   10 mg at 10/11/23 0942   DULoxetine (CYMBALTA) DR capsule 60 mg  60 mg Oral Daily Marylu Gee, DO   60 mg at 10/11/23 9057   enoxaparin  (LOVENOX ) injection 40 mg  40 mg Subcutaneous Q24H Marylu Gee, DO   40 mg at 10/11/23 0944   finasteride (PROSCAR) tablet 5 mg  5 mg Oral Daily Marylu Gee, DO   5 mg at 10/11/23 0945   furosemide  (LASIX ) tablet 20 mg  20 mg Oral BID Marylu Gee, DO   20 mg at 10/11/23 0942   HYDROmorphone (DILAUDID) injection 0.5 mg  0.5 mg Intravenous Q4H PRN Marylu Gee, DO   0.5 mg at 10/10/23 2350   levothyroxine  (SYNTHROID ) tablet 50 mcg  50 mcg Oral Q0600 Marylu Gee, DO   50 mcg at 10/11/23 0559   losartan (COZAAR) tablet 50 mg  50 mg Oral Daily Marylu Gee, DO   50 mg at 10/11/23 9057   oxyCODONE-acetaminophen  (PERCOCET/ROXICET) 5-325 MG per tablet 1 tablet  1 tablet Oral Q4H PRN Williams, Julie Anne, MD       And    oxyCODONE (Oxy IR/ROXICODONE) immediate release tablet 5 mg  5 mg Oral Q4H PRN Williams, Julie Anne, MD   5 mg at 10/11/23 0943   pantoprazole (PROTONIX) EC tablet 40 mg  40 mg Oral Daily Marylu Gee, DO   40 mg at 10/11/23 0943   pregabalin (LYRICA) capsule 75 mg  75 mg Oral BID Marylu Gee, DO   75 mg at 10/11/23 0943   rOPINIRole (REQUIP) tablet 1 mg  1 mg Oral Daily Marylu Gee, DO   1 mg at 10/11/23 0945   tiZANidine (ZANAFLEX) tablet 4 mg  4 mg Oral TID PRN Marylu Gee, DO   4 mg at 10/11/23 0121   traZODone (DESYREL) tablet 100 mg  100 mg Oral QHS Marylu Gee, DO   100 mg at 10/10/23 2353  Discharge Medications: Please see discharge summary for a list of discharge medications.  Relevant Imaging Results:  Relevant Lab Results:   Additional Information SS# [id number]  [name], LCSW

## 2023-10-11 NOTE — Progress Notes (Signed)
 Occupational Therapy Treatment Patient Details Name: Lauren Roth MRN: [id number] DOB: 18-Aug-1945 Today's Date: 10/11/2023   History of present illness Pt is a 78 y.o. female admitted 6/23 with c/o LBP. Imaging revealed mild L2 compression fx. Pt also with c/o L shoulder pain. Imaging negative. PMH:  lumbosacral DDD, chronic pain syndrome, fibromyalgia, HTN, SVT, DVT, chronic lymphedema, HFpEF (TTE, 10/2022), hypothyroidism, GERD, memory loss, depression, migraines, neuropathy, obesity, restless leg syndrome   OT comments  Patient with better performance this afternoon compared to PT session earlier.  Patient stating pain is better managed.  Patient largely CGA to supervision for in room mobility/toileting and Min A for lower body ADL from a sit to stand level.  OT will continue efforts in the acute setting, and maintain SNF recommendation, as this was only one session.  Discharge disposition will depend on pain management.  Patient's spouse cannot physically assist her due to a recent illness.        If plan is discharge home, recommend the following:  Assist for transportation;A little help with bathing/dressing/bathroom;A little help with walking and/or transfers   Equipment Recommendations  None recommended by OT    Recommendations for Other Services      Precautions / Restrictions Precautions Precautions: Fall Recall of Precautions/Restrictions: Intact Restrictions Weight Bearing Restrictions Per Provider Order: No       Mobility Bed Mobility Overal bed mobility: Needs Assistance Bed Mobility: Supine to Sit     Supine to sit: Supervision, HOB elevated          Transfers Overall transfer level: Needs assistance Equipment used: Rolling walker (2 wheels) Transfers: Sit to/from Stand, Bed to chair/wheelchair/BSC Sit to Stand: Supervision, Contact guard assist     Step pivot transfers: Supervision           Balance Overall balance assessment: Needs  assistance Sitting-balance support: No upper extremity supported, Feet supported Sitting balance-Leahy Scale: Fair     Standing balance support: Reliant on assistive device for balance Standing balance-Leahy Scale: Fair                             ADL either performed or assessed with clinical judgement   ADL       Grooming: Wash/dry hands;Supervision/safety;Standing   Upper Body Bathing: Set up;Sitting   Lower Body Bathing: Minimal assistance;Sit to/from stand   Upper Body Dressing : Set up;Sitting   Lower Body Dressing: Minimal assistance;Sit to/from stand   Toilet Transfer: Supervision/safety;Rolling walker (2 wheels)                  Extremity/Trunk Assessment Upper Extremity Assessment Upper Extremity Assessment: Overall WFL for tasks assessed   Lower Extremity Assessment Lower Extremity Assessment: Defer to PT evaluation   Cervical / Trunk Assessment Cervical / Trunk Assessment: Kyphotic    Vision Baseline Vision/History: 1 Wears glasses Patient Visual Report: No change from baseline     Perception Perception Perception: Not tested   Praxis Praxis Praxis: Not tested   Communication Communication Communication: No apparent difficulties   Cognition Arousal: Alert Behavior During Therapy: WFL for tasks assessed/performed Cognition: No apparent impairments                               Following commands: Intact        Cueing   Cueing Techniques: Verbal cues, Gestural cues  Exercises  Shoulder Instructions       General Comments      Pertinent Vitals/ Pain       Pain Assessment Pain Assessment: Faces Faces Pain Scale: Hurts little more Pain Location: low back, RLE Pain Descriptors / Indicators: Aching Pain Intervention(s): Monitored during session  Home Living Family/patient expects to be discharged to:: Private residence Living Arrangements: Spouse/significant other Available Help at Discharge:  Family;Available PRN/intermittently Type of Home: House Home Access: Level entry     Home Layout: Laundry or work area in basement     [employer] Shower/Tub: Chief Strategy Officer: Standard     Home Equipment: Rollator (4 wheels);Cane - single point;Tub bench;Shower seat;BSC/3in1;Grab bars - tub/shower;Wheelchair - manual   Additional Comments: Pt's husband underwent CABG approx 1 week ago.  Pt started attended [facility] approx 2 years ago which improved her mobility. Prior to that, pt was very sendentary for 10 years or so. Only ambulating short household distances with rollator and dependent w/c mobility in community.      Prior Functioning/Environment              Frequency  Min 2X/week        Progress Toward Goals  OT Goals(current goals can now be found in the care plan section)     Acute Rehab OT Goals Patient Stated Goal: Return home OT Goal Formulation: With patient Time For Goal Achievement: 10/25/23 Potential to Achieve Goals: Good ADL Goals Pt Will Perform Grooming: with modified independence;standing Pt Will Perform Lower Body Dressing: with modified independence;sit to/from stand Pt Will Transfer to Toilet: with modified independence;regular height toilet;ambulating  Plan      Co-evaluation                 AM-PAC OT 6 Clicks Daily Activity     Outcome Measure   Help from another person eating meals?: None Help from another person taking care of personal grooming?: A Little Help from another person toileting, which includes using toliet, bedpan, or urinal?: A Little Help from another person bathing (including washing, rinsing, drying)?: A Little Help from another person to put on and taking off regular upper body clothing?: None Help from another person to put on and taking off regular lower body clothing?: A Little 6 Click Score: 20    End of Session Equipment Utilized During Treatment: Rolling walker (2  wheels)  OT Visit Diagnosis: Unsteadiness on feet (R26.81);Pain Pain - Right/Left: Left Pain - part of body: Shoulder   Activity Tolerance Patient tolerated treatment well   Patient Left in chair;with call bell/phone within reach   Nurse Communication Mobility status        Time: 8667-8646 OT Time Calculation (min): 21 min  Charges: OT General Charges $OT Visit: 1 Visit OT Evaluation $OT Eval Moderate Complexity: 1 Mod  10/11/2023  RP, OTR/L  Acute Rehabilitation Services  Office:  [phone]   Charlie JONETTA Halsted 10/11/2023, 2:03 PM

## 2023-10-11 NOTE — Hospital Course (Signed)
 Continue with daily use of CircAid wraps.  For her lymphedema  Was to follow-up in 3 months for virtual visit.  Does not seem if patient did this.

## 2023-10-12 DIAGNOSIS — S32020D Wedge compression fracture of second lumbar vertebra, subsequent encounter for fracture with routine healing: Secondary | ICD-10-CM | POA: Diagnosis not present

## 2023-10-12 NOTE — Progress Notes (Signed)
 Physical Therapy Treatment  Patient Details Name: Lauren Roth MRN: [id number] DOB: 06-17-1945 Today's Date: 10/12/2023   History of Present Illness Pt is a 78 y.o. female admitted 6/23 with c/o LBP. Imaging revealed mild L2 compression fx. Pt also with c/o L shoulder pain. Imaging negative. PMH:  lumbosacral DDD, chronic pain syndrome, fibromyalgia, HTN, SVT, DVT, chronic lymphedema, HFpEF (TTE, 10/2022), hypothyroidism, memory loss, depression, migraines, neuropathy, obesity, restless leg syndrome    PT Comments  Pt progressing towards physical therapy goals. Was able to progress to hallway ambulation this session however fatigued quickly once up. Pt states she has chosen to d/c to rehab instead of going home. Feel this is reasonable given current pain and level of function. Daughter present and asking about pt's LUE pain. Assessed LUE and pt with increased pain with shoulder AROM in abduction, horizontal abd/add, and flexion. Pt reports sensation is baseline and equal R and L except over tricep area where she had a prior surgery.  Overall pt with functional use of LUE as OT evaluation states. Pt reporting that L UE pain began at the same time as her other complaints began which led to this admission. Will continue to follow and progress as able per POC.    If plan is discharge home, recommend the following: A little help with walking and/or transfers;A little help with bathing/dressing/bathroom;Assistance with cooking/housework;Help with stairs or ramp for entrance;Assist for transportation   Can travel by private vehicle     No  Equipment Recommendations  Rolling walker (2 wheels)    Recommendations for Other Services       Precautions / Restrictions Precautions Precautions: Fall Recall of Precautions/Restrictions: Intact Restrictions Weight Bearing Restrictions Per Provider Order: No     Mobility  Bed Mobility               General bed mobility comments: Pt  was received sitting up in recliner.    Transfers Overall transfer level: Needs assistance Equipment used: Rolling walker (2 wheels) Transfers: Sit to/from Stand Sit to Stand: Contact guard assist           General transfer comment: VC's for hand placement on seated surface for safety. Encouraged pt not to push through LUE due to pain. At end of session 5x sit<>stand with focus on hand placement and standing posture.    Ambulation/Gait Ambulation/Gait assistance: Contact guard assist Gait Distance (Feet): 80 Feet Assistive device: Rolling walker (2 wheels) Gait Pattern/deviations: Step-through pattern, Decreased stride length, Trunk flexed Gait velocity: Decreased Gait velocity interpretation: <1.31 ft/sec, indicative of household ambulator   General Gait Details: Slow and effortful steps. VC's for improved posture throughout gait training. Pt reports feeling as if R knee buckling/giving out, but no overt LOB/buckling noted. Pt fatigues quickly but was able to make it back to the room without fault.   Stairs             Wheelchair Mobility     Tilt Bed    Modified Rankin (Stroke Patients Only)       Balance Overall balance assessment: Needs assistance Sitting-balance support: No upper extremity supported, Feet supported Sitting balance-Leahy Scale: Fair     Standing balance support: Reliant on assistive device for balance Standing balance-Leahy Scale: Fair                              Musician Communication: No apparent difficulties  Cognition Arousal: Alert Behavior During  Therapy: WFL for tasks assessed/performed   PT - Cognitive impairments: No apparent impairments                         Following commands: Intact      Cueing Cueing Techniques: Verbal cues, Gestural cues  Exercises      General Comments        Pertinent Vitals/Pain Pain Assessment Pain Assessment: Faces Faces Pain Scale: Hurts  even more Pain Location: low back, RLE, LUE Pain Descriptors / Indicators: Aching, Sore Pain Intervention(s): Limited activity within patient's tolerance, Monitored during session, Repositioned    Home Living                          Prior Function            PT Goals (current goals can now be found in the care plan section) Acute Rehab PT Goals Patient Stated Goal: Decreased PT Goal Formulation: With patient/family Time For Goal Achievement: 10/25/23 Potential to Achieve Goals: Good Progress towards PT goals: Progressing toward goals    Frequency    Min 2X/week      PT Plan      Co-evaluation              AM-PAC PT 6 Clicks Mobility   Outcome Measure  Help needed turning from your back to your side while in a flat bed without using bedrails?: A Lot Help needed moving from lying on your back to sitting on the side of a flat bed without using bedrails?: A Lot Help needed moving to and from a bed to a chair (including a wheelchair)?: A Little Help needed standing up from a chair using your arms (e.g., wheelchair or bedside chair)?: A Little Help needed to walk in hospital room?: A Little Help needed climbing 3-5 steps with a railing? : Total 6 Click Score: 14    End of Session Equipment Utilized During Treatment: Gait belt Activity Tolerance: Patient limited by pain;Patient limited by fatigue Patient left: in chair;with call bell/phone within reach;with family/visitor present Nurse Communication: Mobility status PT Visit Diagnosis: Other abnormalities of gait and mobility (R26.89);Pain;Muscle weakness (generalized) (M62.81) Pain - part of body:  (R knee, L shoulder, low back)     Time: 1040-1107 PT Time Calculation (min) (ACUTE ONLY): 27 min  Charges:    $Gait Training: 23-37 mins PT General Charges $$ ACUTE PT VISIT: 1 Visit                     Leita Sable, PT, DPT Acute Rehabilitation Services Secure Chat Preferred Office:  [phone]    Leita JONETTA Sable 10/12/2023, 11:22 AM

## 2023-10-12 NOTE — Progress Notes (Signed)
   10/12/23 0930  Spiritual Encounters  Type of Visit Initial  Care provided to: Patient  Conversation partners present during encounter Nurse  Reason for visit Routine spiritual support  OnCall Visit No  Spiritual Framework  Presenting Themes Meaning/purpose/sources of inspiration;Caregiving needs;Goals in life/care;Values and beliefs;Significant life change;Community and relationships;Rituals and practive  Values/beliefs Christianity  Community/Connection Family;Friend(s);Faith community;Significant other  Strengths Strong family/community support  Needs/Challenges/Barriers Feelings of guilt; caregiving needs  Interventions  Spiritual Care Interventions Made Established relationship of care and support;Compassionate presence;Reflective listening;Normalization of emotions;Reconciliation with self/others;Explored values/beliefs/practices/strengths;Meaning making;Prayer;Self-care teaching;Encouragement  Intervention Outcomes  Outcomes Connection to spiritual care;Awareness around self/spiritual resourses;Connection to values and goals of care;Reduced anxiety;Reduced isolation  Advance Directives (For Healthcare)  Does Patient Have a Medical Advance Directive? No  Would patient like information on creating a medical advance directive? Yes (Inpatient - patient defers creating a medical advance directive at this time - Information given)    Chaplain responded to spiritual consult request for AD. Paperwork and information given.   Chaplain explored pt's emotional state. She expressed her disappointment with having to go to rehab, but understands it is necessary. Chaplain provided space for pt to share these feelings, along with compassionate presence and prayer. Pt expressed gratitude for chaplain's presence, stating chaplain had come at the right time. Chaplains remain available.

## 2023-10-12 NOTE — Progress Notes (Signed)
 PROGRESS NOTE    Lauren Roth  FMW:[id number] DOB: 1945-05-25 DOA: 10/10/2023 PCP: Sherre Clapper, MD   Brief Narrative: This 78 year old female with PMH significant for lumbosacral DDD, Chronic pain syndrome, fibromyalgia, HTN, SVT, DVT, chronic lymphedema, HFpEF (TTE, 10/2022), hypothyroidism, GERD, memory loss, depression, migraines, Neuropathy, obesity, restless leg syndrome who presented in the ED with acute worsening of low back pain.  Patient stood up this morning and has developed sharp 10 x 10 stabbing left back pain with radiation to the right leg to the foot.  Patient denies urinary bowel incontinence and perineal numbness She also reports worsening bilateral lower extremity swelling despite taking normal dose of Lasix . Denies SOB, orthopnea, PND, chest pain/palpitations.  She also states that 5 days ago she began having bilateral symmetrical shoulder, hip, and ankle pain that all occurred at once.  Patient was admitted for further evaluation.  Assessment & Plan:   Principal Problem:   Compression fracture of L2 (HCC) Active Problems:   Hypothyroidism   Essential hypertension   GERD   Memory loss   Chronic pain syndrome   Depression   Fibromyalgia   Hypokalemia  Compression Fracture of L2 Fibromyalgia / Chronic pain syndrome: Lumbosacral DDD: Patient presented with 10/10 lower back pain with radiation to right leg to foot.  CT L-spine shows compression fracture through superior endplate of L2, age-indeterminate but new since 06/2023.   Neurosurgery consulted in ED who did not see need for further workup with recommendation to follow up with her spine doctor.  Continue adequate pain control with pain medicines.   Review of records show extensive history of chronic back pain. Patient follows [facility] and was seen 4/16 for low back and right knee pain.  She has also seen orthopedic surgeon, Rajbir Dennise Mantis, MD, in 2024 who recommended  non-operative management.  She has received SI and lumbosacral steroid injections with minimal relief.  This seems to be a chronic issue with acute worsening back pain. Unclear etiology of bilateral symmetrical shoulder, hip, and ankle pain given that it all began acutely a few days ago.   Chronic pain syndrome/fibromyalgia could be contributing.  However, on exam left shoulder pain is markedly more severe.  No weakness.  No precipitating illness or vaccination. Continue home Percocet 5-325 every 4 as needed, Zanaflex 4 mg 3 times daily as needed, Lyrica 75 mg twice daily, Cymbalta 60 mg daily. Continue IV Dilaudid 0.5 mg every 4 as needed for breakthrough pain. PT/OT   Chronic lymphedema: History of DVT HFpEF (TTE 10/2021) Worsening bilateral R > L edema despite home Lasix .   Decreased mobility in recent days has contributed to this. No other symptoms associated with CHF exacerbation.  2023 TTE showed Grade I DD.  RLE is diffusely tender compared to left which is new.  Well's DVT = 1. Resume home Lasix  20 mg twice daily. BiLateral venous duplex negative for DVT.   HTN: History of SVT Mildly hypertensive ~ 150's. Heart RRR. Resume home clonidine 0.1 mg twice daily, Cardizem 120 mg daily, losartan 50 mg daily, furosemide  per above Trend vitals   Depression: Increased stress recently as patient's husband underwent CABG 1 month ago.   Continue home Wellbutrin 300 mg daily and Trazodone 100 mg at bedtime.   Memory loss: Continue home Aricept  10 mg daily.   Hypothyroidism: Continue Synthroid  50 mcg daily.   GERD Continue Protonix 40 mg daily.   Hypokalemia: Replaced. Continue to monitor.   Restless leg syndrome:  Continue home Requip 1 mg daily.   DVT prophylaxis: Lovenox  Code Status: Full code Family Communication:No family at bed side Disposition Plan:    Status is: Observation The patient remains OBS appropriate and will d/c before 2 midnights.   Patient admitted for  acute on chronic lumbosacral back pain.  Patient is now medically cleared awaiting SNF placement.  Consultants:  Neurosurgery  Procedures: Antimicrobials:  Anti-infectives (From admission, onward)    None      Subjective: Patient was seen and examined at bedside.  Overnight events noted. Patient reports pain is now reasonably controlled,  when she moves pain gets worse. She has follow-up appointment with the spine doctor and the pain management at [facility]. Patient was sitting comfortably in the recliner,  states she is fine.  Objective: Vitals:   10/11/23 2025 10/12/23 0357 10/12/23 0825 10/12/23 1023  BP: (!) 136/50 (!) 146/60 (!) 126/52 (!) 126/52  Pulse: 69 67 63   Resp:  16    Temp:  98.6 F (37 C) 98.1 F (36.7 C)   TempSrc:  Oral Oral   SpO2:  95% 96%   Weight:      Height:       No intake or output data in the 24 hours ending 10/12/23 1334 Filed Weights   10/10/23 1453 10/11/23 0100  Weight: 97 kg 97 kg    Examination:  General exam: Appears calm and comfortable, deconditioned, not in any acute distress. Respiratory system:CTA Bilaterally . Respiratory effort normal.  RR 14 Cardiovascular system: S1 & S2 heard, RRR. No JVD, murmurs, rubs, gallops or clicks.  Gastrointestinal system: Abdomen is non distended, soft and nontender.  Normal bowel sounds heard. Central nervous system: Alert and oriented x 3. No focal neurological deficits. Extremities: Bilateral lymphedema, swelling noted. Skin: No rashes, lesions or ulcers Psychiatry: Judgement and insight appear normal. Mood & affect appropriate.     Data Reviewed: I have personally reviewed following labs and imaging studies  CBC: Recent Labs  Lab 10/10/23 1459  WBC 6.7  NEUTROABS 3.4  HGB 12.2  HCT 37.1  MCV 90.9  PLT 284   Basic Metabolic Panel: Recent Labs  Lab 10/10/23 1459 10/11/23 0548  NA 139 137  K 3.4* 4.1  CL 96* 100  CO2 30 31  GLUCOSE 118* 122*  BUN 6* 5*  CREATININE 0.97 0.90   CALCIUM 9.0 8.5*  MG  --  2.0   GFR: Estimated Creatinine Clearance: 60.1 mL/min (by C-G formula based on SCr of 0.9 mg/dL). Liver Function Tests: Recent Labs  Lab 10/10/23 1459  AST 26  ALT 20  ALKPHOS 63  BILITOT 0.5  PROT 6.5  ALBUMIN 3.4*   No results for input(s): LIPASE, AMYLASE in the last 168 hours. No results for input(s): AMMONIA in the last 168 hours. Coagulation Profile: No results for input(s): INR, PROTIME in the last 168 hours. Cardiac Enzymes: No results for input(s): CKTOTAL, CKMB, CKMBINDEX, TROPONINI in the last 168 hours. BNP (last 3 results) No results for input(s): PROBNP in the last 8760 hours. HbA1C: No results for input(s): HGBA1C in the last 72 hours. CBG: No results for input(s): GLUCAP in the last 168 hours. Lipid Profile: No results for input(s): CHOL, HDL, LDLCALC, TRIG, CHOLHDL, LDLDIRECT in the last 72 hours. Thyroid  Function Tests: No results for input(s): TSH, T4TOTAL, FREET4, T3FREE, THYROIDAB in the last 72 hours. Anemia Panel: No results for input(s): VITAMINB12, FOLATE, FERRITIN, TIBC, IRON, RETICCTPCT in the last 72 hours. Sepsis Labs: No  results for input(s): PROCALCITON, LATICACIDVEN in the last 168 hours.  No results found for this or any previous visit (from the past 240 hours).   Radiology Studies: VAS US  LOWER EXTREMITY VENOUS (DVT) Result Date: 10/11/2023  Lower Venous DVT Study Patient Name:  Lauren Roth  Date of Exam:   10/11/2023 Medical Rec #: [id number]                  Accession #:    [id number] Date of Birth: [birth year]                 Patient Gender: F Patient Age:   58 years Exam Location:  [facility] Procedure:      VAS US  LOWER EXTREMITY VENOUS (DVT) Referring Phys: JULIE WILLIAMS --------------------------------------------------------------------------------  Indications: Edema.  Limitations: Poor ultrasound/tissue interface.  Comparison Study: Previous exam (reflux) on 02/10/2021 was negative for DVT Performing Technologist: Ezzie Potters RVT, RDMS  Examination Guidelines: A complete evaluation includes B-mode imaging, spectral Doppler, color Doppler, and power Doppler as needed of all accessible portions of each vessel. Bilateral testing is considered an integral part of a complete examination. Limited examinations for reoccurring indications may be performed as noted. The reflux portion of the exam is performed with the patient in reverse Trendelenburg.  +---------+---------------+---------+-----------+----------+--------------+ RIGHT    CompressibilityPhasicitySpontaneityPropertiesThrombus Aging +---------+---------------+---------+-----------+----------+--------------+ CFV      Full           Yes      Yes                                 +---------+---------------+---------+-----------+----------+--------------+ SFJ      Full                                                        +---------+---------------+---------+-----------+----------+--------------+ FV Prox  Full           Yes      Yes                                 +---------+---------------+---------+-----------+----------+--------------+ FV Mid   Full           Yes      Yes                                 +---------+---------------+---------+-----------+----------+--------------+ FV DistalFull           Yes      Yes                                 +---------+---------------+---------+-----------+----------+--------------+ PFV      Full                                                        +---------+---------------+---------+-----------+----------+--------------+ POP      Full           Yes      Yes                                 +---------+---------------+---------+-----------+----------+--------------+  PTV      Full                                                         +---------+---------------+---------+-----------+----------+--------------+ PERO     Full                                                        +---------+---------------+---------+-----------+----------+--------------+   +---------+---------------+---------+-----------+----------+--------------+ LEFT     CompressibilityPhasicitySpontaneityPropertiesThrombus Aging +---------+---------------+---------+-----------+----------+--------------+ CFV      Full           Yes      Yes                                 +---------+---------------+---------+-----------+----------+--------------+ SFJ      Full                                                        +---------+---------------+---------+-----------+----------+--------------+ FV Prox  Full           Yes      Yes                                 +---------+---------------+---------+-----------+----------+--------------+ FV Mid   Full           Yes      Yes                                 +---------+---------------+---------+-----------+----------+--------------+ FV DistalFull           Yes      Yes                                 +---------+---------------+---------+-----------+----------+--------------+ PFV      Full                                                        +---------+---------------+---------+-----------+----------+--------------+ POP      Full           Yes      Yes                                 +---------+---------------+---------+-----------+----------+--------------+ PTV      Full                                                        +---------+---------------+---------+-----------+----------+--------------+  PERO     Full                                                        +---------+---------------+---------+-----------+----------+--------------+     Summary: BILATERAL: - No evidence of deep vein thrombosis seen in the lower extremities, bilaterally. -No evidence of  popliteal cyst, bilaterally.   *See table(s) above for measurements and observations. Electronically signed by Penne Colorado MD on 10/11/2023 at 5:03:56 PM.    Final    DG Shoulder Left Result Date: 10/11/2023 CLINICAL DATA:  Left shoulder pain EXAM: LEFT SHOULDER - 2+ VIEW COMPARISON:  11/18/2018 FINDINGS: There is no evidence of fracture or dislocation. There is no evidence of arthropathy or other focal bone abnormality. Soft tissues are unremarkable. Changes of prior fixation in the mid humerus are seen. IMPRESSION: No acute abnormality noted. Electronically Signed   By: Oneil Devonshire M.D.   On: 10/11/2023 01:16   CT Thoracic Spine Wo Contrast Result Date: 10/10/2023 CLINICAL DATA:  Mid back pain EXAM: CT THORACIC SPINE WITHOUT CONTRAST TECHNIQUE: Multidetector CT images of the thoracic were obtained using the standard protocol without intravenous contrast. RADIATION DOSE REDUCTION: This exam was performed according to the departmental dose-optimization program which includes automated exposure control, adjustment of the mA and/or kV according to patient size and/or use of iterative reconstruction technique. COMPARISON:  None Available. FINDINGS: Alignment: Normal Vertebrae: No acute fracture or focal pathologic process. Paraspinal and other soft tissues: Negative. Disc levels: Early spurring anteriorly.  No disc herniation. IMPRESSION: No acute bony abnormality. Electronically Signed   By: Franky Crease M.D.   On: 10/10/2023 17:25   CT L-SPINE NO CHARGE Result Date: 10/10/2023 CLINICAL DATA:  Mid back pain EXAM: CT LUMBAR SPINE WITHOUT CONTRAST TECHNIQUE: Multidetector CT imaging of the lumbar spine was performed without intravenous contrast administration. Multiplanar CT image reconstructions were also generated. RADIATION DOSE REDUCTION: This exam was performed according to the departmental dose-optimization program which includes automated exposure control, adjustment of the mA and/or kV according to  patient size and/or use of iterative reconstruction technique. COMPARISON:  MRI 07/06/2023 FINDINGS: Segmentation: 5 lumbar type vertebrae. Alignment: Normal Vertebrae: There is slight compression fracture through the superior endplate of L2. Associated sclerosis noted in the superior L2 vertebral body suggests healing, but this is new since MRI 07/06/2023. No additional fracture. No focal suspicious osseous abnormality. Paraspinal and other soft tissues: Negative Disc levels: Broad-based disc bulges noted at L3-4 and L4-5. No focal disc herniation. Mild central canal stenosis at these levels. Diffuse degenerative facet disease. IMPRESSION: Slight compression fracture through the superior endplate of L2. This is age indeterminate, but new since 07/06/2023. Broad-based disc bulges with central canal stenosis at L3-4 and L4-5. Electronically Signed   By: Franky Crease M.D.   On: 10/10/2023 17:24   CT ABDOMEN PELVIS W CONTRAST Result Date: 10/10/2023 CLINICAL DATA:  Right lower quadrant pain EXAM: CT ABDOMEN AND PELVIS WITH CONTRAST TECHNIQUE: Multidetector CT imaging of the abdomen and pelvis was performed using the standard protocol following bolus administration of intravenous contrast. RADIATION DOSE REDUCTION: This exam was performed according to the departmental dose-optimization program which includes automated exposure control, adjustment of the mA and/or kV according to patient size and/or use of iterative reconstruction technique. CONTRAST:  75mL OMNIPAQUE IOHEXOL 350 MG/ML SOLN COMPARISON:  10/17/2020 FINDINGS: Lower chest: No acute abnormality Hepatobiliary: No focal liver abnormality is seen. Status post cholecystectomy. No biliary dilatation. Pancreas: No focal abnormality or ductal dilatation. Fatty replacement. Spleen: No focal abnormality.  Normal size. Adrenals/Urinary Tract: No adrenal abnormality. No focal renal abnormality. No stones or hydronephrosis. Urinary bladder is unremarkable.  Stomach/Bowel: Normal appendix. Stomach, large and small bowel grossly unremarkable. No bowel obstruction or inflammatory process. Vascular/Lymphatic: Aortic atherosclerosis. No evidence of aneurysm or adenopathy. Reproductive: Prior hysterectomy.  No adnexal masses. Other: No free fluid or free air. Musculoskeletal: No acute bony abnormality. IMPRESSION: No acute findings in the abdomen or pelvis. Aortic atherosclerosis. Electronically Signed   By: Franky Crease M.D.   On: 10/10/2023 17:20   Scheduled Meds:  aspirin  81 mg Oral Daily   buPROPion  300 mg Oral Daily   cloNIDine  0.1 mg Oral BID   diltiazem  120 mg Oral Daily   donepezil   10 mg Oral Daily   DULoxetine  60 mg Oral Daily   enoxaparin  (LOVENOX ) injection  40 mg Subcutaneous Q24H   finasteride  5 mg Oral Daily   furosemide   20 mg Oral BID   levothyroxine   50 mcg Oral Q0600   losartan  50 mg Oral Daily   pantoprazole  40 mg Oral Daily   pregabalin  75 mg Oral BID   rOPINIRole  1 mg Oral Daily   traZODone  100 mg Oral QHS   Continuous Infusions:   LOS: 1 day    Time spent: 35 mins    Darcel Dawley, MD [facility]   If 7PM-7AM, please contact night-coverage

## 2023-10-12 NOTE — Plan of Care (Signed)
   Problem: Activity: Goal: Risk for activity intolerance will decrease Outcome: Progressing   Problem: Nutrition: Goal: Adequate nutrition will be maintained Outcome: Progressing   Problem: Coping: Goal: Level of anxiety will decrease Outcome: Progressing   Problem: Pain Managment: Goal: General experience of comfort will improve and/or be controlled Outcome: Progressing

## 2023-10-12 NOTE — Plan of Care (Signed)

## 2023-10-12 NOTE — TOC Progression Note (Signed)
 Transition of Care [facility]) - Progression Note    Patient Details  Name: Lauren Roth MRN: [id number] Date of Birth: 1946-01-05  Transition [facility]) CM/SW Contact  [name], LCSW Phone Number: 10/12/2023, 10:24 AM  Clinical Narrative:     10:24 AM CSW followed up with patient on SNF decision. Patient accepted bed offer at [facility]. CSW relayed acceptance to SNF admissions who confirmed bed availability for discharge today upon insurance authorization approval. Per hospitalist, patient is medically ready for discharge to SNF. CSW submitted insurance authorization request for SNF and ambulance transportation.  Expected Discharge Plan: Skilled Nursing Facility Barriers to Discharge: Continued Medical Work up, English as a second language teacher, SNF Pending bed offer  Expected Discharge Plan and Services In-house Referral: Clinical Social Work   Post Acute Care Choice: Skilled Nursing Facility Living arrangements for the past 2 months: Single Family Home                                       Social Determinants of Health (SDOH) Interventions SDOH Screenings   Food Insecurity: No Food Insecurity (10/11/2023)  Housing: Low Risk  (10/11/2023)  Transportation Needs: No Transportation Needs (10/11/2023)  Utilities: Not At Risk (10/11/2023)  Depression (PHQ2-9): Low Risk  (07/18/2023)  Financial Resource Strain: Low Risk  (03/21/2023)  Physical Activity: Inactive (03/21/2023)  Social Connections: Socially Integrated (10/11/2023)  Stress: No Stress Concern Present (03/21/2023)  Tobacco Use: Medium Risk (10/10/2023)  Health Literacy: Adequate Health Literacy (03/21/2023)    Readmission Risk Interventions     No data to display

## 2023-10-13 DIAGNOSIS — I110 Hypertensive heart disease with heart failure: Secondary | ICD-10-CM | POA: Diagnosis not present

## 2023-10-13 DIAGNOSIS — R2681 Unsteadiness on feet: Secondary | ICD-10-CM | POA: Diagnosis not present

## 2023-10-13 DIAGNOSIS — K219 Gastro-esophageal reflux disease without esophagitis: Secondary | ICD-10-CM | POA: Insufficient documentation

## 2023-10-13 DIAGNOSIS — E039 Hypothyroidism, unspecified: Secondary | ICD-10-CM | POA: Diagnosis not present

## 2023-10-13 DIAGNOSIS — M6259 Muscle wasting and atrophy, not elsewhere classified, multiple sites: Secondary | ICD-10-CM | POA: Diagnosis not present

## 2023-10-13 DIAGNOSIS — R413 Other amnesia: Secondary | ICD-10-CM | POA: Insufficient documentation

## 2023-10-13 DIAGNOSIS — F329 Major depressive disorder, single episode, unspecified: Secondary | ICD-10-CM | POA: Diagnosis not present

## 2023-10-13 DIAGNOSIS — S32029A Unspecified fracture of second lumbar vertebra, initial encounter for closed fracture: Secondary | ICD-10-CM | POA: Insufficient documentation

## 2023-10-13 DIAGNOSIS — G8929 Other chronic pain: Secondary | ICD-10-CM | POA: Insufficient documentation

## 2023-10-13 DIAGNOSIS — R6889 Other general symptoms and signs: Secondary | ICD-10-CM | POA: Diagnosis not present

## 2023-10-13 DIAGNOSIS — R1312 Dysphagia, oropharyngeal phase: Secondary | ICD-10-CM | POA: Diagnosis not present

## 2023-10-13 DIAGNOSIS — F32A Depression, unspecified: Secondary | ICD-10-CM | POA: Diagnosis not present

## 2023-10-13 DIAGNOSIS — R300 Dysuria: Secondary | ICD-10-CM | POA: Diagnosis not present

## 2023-10-13 DIAGNOSIS — S32020A Wedge compression fracture of second lumbar vertebra, initial encounter for closed fracture: Secondary | ICD-10-CM | POA: Diagnosis not present

## 2023-10-13 DIAGNOSIS — S32020D Wedge compression fracture of second lumbar vertebra, subsequent encounter for fracture with routine healing: Secondary | ICD-10-CM | POA: Diagnosis not present

## 2023-10-13 DIAGNOSIS — R059 Cough, unspecified: Secondary | ICD-10-CM | POA: Diagnosis not present

## 2023-10-13 DIAGNOSIS — Z8679 Personal history of other diseases of the circulatory system: Secondary | ICD-10-CM | POA: Diagnosis not present

## 2023-10-13 DIAGNOSIS — I700 Atherosclerosis of aorta: Secondary | ICD-10-CM | POA: Diagnosis not present

## 2023-10-13 DIAGNOSIS — M51369 Other intervertebral disc degeneration, lumbar region without mention of lumbar back pain or lower extremity pain: Secondary | ICD-10-CM | POA: Diagnosis not present

## 2023-10-13 DIAGNOSIS — E876 Hypokalemia: Secondary | ICD-10-CM | POA: Diagnosis not present

## 2023-10-13 DIAGNOSIS — I10 Essential (primary) hypertension: Secondary | ICD-10-CM | POA: Diagnosis not present

## 2023-10-13 DIAGNOSIS — G43909 Migraine, unspecified, not intractable, without status migrainosus: Secondary | ICD-10-CM | POA: Diagnosis not present

## 2023-10-13 DIAGNOSIS — R262 Difficulty in walking, not elsewhere classified: Secondary | ICD-10-CM | POA: Diagnosis not present

## 2023-10-13 DIAGNOSIS — M797 Fibromyalgia: Secondary | ICD-10-CM | POA: Diagnosis not present

## 2023-10-13 MED ORDER — OXYCODONE-ACETAMINOPHEN 10-325 MG PO TABS: 1.0000 | ORAL_TABLET | ORAL | 0 refills | Status: AC | PRN

## 2023-10-13 MED ORDER — BUPRENORPHINE HCL 2 MG SL SUBL: 2.0000 mg | SUBLINGUAL_TABLET | Freq: Every day | SUBLINGUAL | 0 refills | Status: AC

## 2023-10-13 NOTE — Discharge Summary (Signed)
 Physician Discharge Summary  Chelisa Hennen Debes FMW:[id number] DOB: Nov 07, 1945 DOA: 10/10/2023  PCP: Sherre Clapper, MD  Admit date: 10/10/2023  Discharge date: 10/13/2023  Admitted From: Home  Disposition:  SNF  Recommendations for Outpatient Follow-up:  Follow up with PCP in 1-2 weeks. Please obtain BMP/CBC in one week. Advised to follow-up with pain management as scheduled.  Home Health: None Equipment/Devices:None  Discharge Condition: Stable CODE STATUS:Full code Diet recommendation: Heart Healthy  Brief Summary / Hospital Course: This 78 year old female with PMH significant for lumbosacral DDD, Chronic pain syndrome, fibromyalgia, HTN, SVT, DVT, chronic lymphedema, HFpEF (TTE, 10/2022), hypothyroidism, GERD, memory loss, depression, migraines, Neuropathy, obesity, restless leg syndrome who presented in the ED with acute worsening of low back pain.  Patient stood up this morning and has developed sharp 10 x 10 stabbing left back pain with radiation to the right leg to the foot.  Patient denies urinary bowel incontinence and perineal numbness. She also reports worsening bilateral lower extremity swelling despite taking normal dose of Lasix . Denies SOB, orthopnea, PND, chest pain/palpitations.  She also states that 5 days ago she began having bilateral symmetrical shoulder, hip, and ankle pain that all occurred at once.  Patient was admitted for further evaluation.  Patient was continued on pain medications. PT and OT recommended skilled nursing facility. Patient was evaluated by neurosurgery recommended outpatient follow-up with pain management.  Patient feels better and wants to be discharged to SNF.  Discharge Diagnoses:  Principal Problem:   Compression fracture of L2 (HCC) Active Problems:   Hypothyroidism   Essential hypertension   GERD   Memory loss   Chronic pain syndrome   Depression   Fibromyalgia   Hypokalemia  Compression Fracture of L2 Fibromyalgia / Chronic  pain syndrome: Lumbosacral DDD: Patient presented with 10/10 lower back pain with radiation to right leg to foot.  CT L-spine shows compression fracture through superior endplate of L2, age-indeterminate but new since 06/2023.   Neurosurgery consulted in ED who did not see need for further workup with recommendation to follow up with her spine doctor.  Continue adequate pain control with pain medicines.   Review of records show extensive history of chronic back pain. Patient follows [facility] and was seen 4/16 for low back and right knee pain.  She has also seen orthopedic surgeon, Rajbir Dennise Mantis, MD, in 2024 who recommended non-operative management.  She has received SI and lumbosacral steroid injections with minimal relief.  This seems to be a chronic issue with acute worsening back pain. Unclear etiology of bilateral symmetrical shoulder, hip, and ankle pain given that it all began acutely a few days ago.   Chronic pain syndrome/fibromyalgia could be contributing.  However, on exam left shoulder pain is markedly more severe.  No weakness.  No precipitating illness or vaccination. Continue home Percocet 5-325 every 4 as needed, Zanaflex 4 mg 3 times daily as needed, Lyrica 75 mg twice daily, Cymbalta 60 mg daily. Continue IV Dilaudid 0.5 mg every 4 as needed for breakthrough pain. PT/OT > SNF. Resumed on her home medications.  Follow-up outpatient with pain management.   Chronic lymphedema: History of DVT HFpEF (TTE 10/2021) Worsening bilateral R > L edema despite home Lasix .   Decreased mobility in recent days has contributed to this. No other symptoms associated with CHF exacerbation.  2023 TTE showed Grade I DD.  RLE is diffusely tender compared to left which is new.  Well's DVT = 1. Resume home Lasix  20  mg twice daily. BiLateral venous duplex negative for DVT.   HTN: History of SVT Mildly hypertensive ~ 150's. Heart RRR. Resume home clonidine 0.1 mg twice  daily, Cardizem 120 mg daily, losartan 50 mg daily, furosemide  per above    Depression: Increased stress recently as patient's husband underwent CABG 1 month ago.   Continue home Wellbutrin 300 mg daily and Trazodone 100 mg at bedtime.   Memory loss: Continue home Aricept  10 mg daily.   Hypothyroidism: Continue Synthroid  50 mcg daily.   GERD Continue Protonix 40 mg daily.   Hypokalemia: Replaced. Continue to monitor.   Restless leg syndrome: Continue home Requip 1 mg daily.  Discharge Instructions  Discharge Instructions     Call MD for:  difficulty breathing, headache or visual disturbances   Complete by: As directed    Call MD for:  persistant dizziness or light-headedness   Complete by: As directed    Call MD for:  persistant nausea and vomiting   Complete by: As directed    Diet - low sodium heart healthy   Complete by: As directed    Diet general   Complete by: As directed    Discharge instructions   Complete by: As directed    Advised to follow-up with primary care physician in 1 week. Advised to follow-up with pain management as scheduled.   Increase activity slowly   Complete by: As directed       Allergies as of 10/13/2023       Reactions   Cottonseed Oil Hives, Itching   Hives, Itching Raw Cotton   Codeine Sulfate Nausea And Vomiting        Medication List     STOP taking these medications    oxybutynin 10 MG 24 hr tablet Commonly known as: DITROPAN-XL   predniSONE  5 MG (21) Tbpk tablet Commonly known as: STERAPRED UNI-PAK 21 TAB       TAKE these medications    aspirin 81 MG chewable tablet Chew 81 mg by mouth daily.   Biotin 10 MG Tabs Take by mouth.   buprenorphine  2 MG Subl SL tablet Commonly known as: SUBUTEX  Place 1 tablet (2 mg total) under the tongue daily for 5 days.   buPROPion 300 MG 24 hr tablet Commonly known as: WELLBUTRIN XL Take 1 tablet by mouth daily.   Calcium Carb-Cholecalciferol 500-10 MG-MCG Tabs Take  by mouth.   cloNIDine 0.1 MG tablet Commonly known as: CATAPRES Take 1 tablet by mouth 2 (two) times daily.   diclofenac Sodium 1 % Gel Commonly known as: VOLTAREN Apply 0.5 g topically 4 (four) times daily.   diltiazem 120 MG 24 hr capsule Commonly known as: CARDIZEM CD Take 120 mg by mouth daily.   donepezil  10 MG tablet Commonly known as: ARICEPT  Take 1 tablet by mouth once daily   DULoxetine 60 MG capsule Commonly known as: CYMBALTA Take 60 mg by mouth daily.   ergocalciferol 1.25 MG (50000 UT) capsule Commonly known as: VITAMIN D2 Take 50,000 Units by mouth once a week.   FIBER 7 PO Take by mouth.   finasteride 5 MG tablet Commonly known as: PROSCAR Take 5 mg by mouth daily.   fluticasone  50 MCG/ACT nasal spray Commonly known as: FLONASE  Place 1 spray into both nostrils daily as needed for allergies or rhinitis.   furosemide  20 MG tablet Commonly known as: LASIX  Take 1 tablet by mouth twice daily   iron polysaccharides 150 MG capsule Commonly known as: NIFEREX Take 150 mg  by mouth daily.   levothyroxine  50 MCG tablet Commonly known as: SYNTHROID  Take 1 tablet by mouth once daily   lidocaine 5 % Commonly known as: LIDODERM Place onto the skin.   loratadine 10 MG tablet Commonly known as: CLARITIN Take 10 mg by mouth daily.   losartan 50 MG tablet Commonly known as: COZAAR Take 50 mg by mouth daily.   montelukast 10 MG tablet Commonly known as: SINGULAIR Take 10 mg by mouth at bedtime as needed (allergies).   nitroGLYCERIN  0.4 MG SL tablet Commonly known as: NITROSTAT  Place 1 tablet (0.4 mg total) under the tongue as needed for chest pain.   omeprazole  20 MG capsule Commonly known as: PRILOSEC Take 1 capsule (20 mg total) by mouth daily.   oxyCODONE-acetaminophen  10-325 MG tablet Commonly known as: PERCOCET Take 1 tablet by mouth every 4 (four) hours as needed for up to 5 days for pain.   pregabalin 75 MG capsule Commonly known as:  LYRICA Take 75 mg by mouth at bedtime.   propranolol ER 60 MG 24 hr capsule Commonly known as: INDERAL LA Take 60 mg by mouth daily.   psyllium 95 % Pack Commonly known as: HYDROCIL/METAMUCIL Take 1 packet by mouth at bedtime.   rOPINIRole 1 MG tablet Commonly known as: REQUIP Take 1 tablet by mouth daily.   thiamine 100 MG tablet Commonly known as: VITAMIN B1 Take by mouth.   tiZANidine 4 MG tablet Commonly known as: ZANAFLEX Take 4 mg by mouth at bedtime.   topiramate 25 MG tablet Commonly known as: TOPAMAX Take 25 mg by mouth 2 (two) times daily.   traZODone 100 MG tablet Commonly known as: DESYREL Take 100 mg by mouth at bedtime.        Contact information for follow-up providers     Cox, Kirsten, MD Follow up in 1 week(s).   Specialty: Family Medicine Contact information: [address] [place] [address] [phone]              Contact information for after-discharge care     Destination     [facility] [address] .   Service: Skilled Nursing Contact information: [address] [place]  [address] [phone]                    Allergies  Allergen Reactions   Cottonseed Oil Hives and Itching    Hives, Itching Raw Cotton    Codeine Sulfate Nausea And Vomiting    Consultations: None   Procedures/Studies: VAS US  LOWER EXTREMITY VENOUS (DVT) Result Date: 10/11/2023  Lower Venous DVT Study Patient Name:  Lauren Roth Perot  Date of Exam:   10/11/2023 Medical Rec #: [id number]                  Accession #:    [id number] Date of Birth: [birth year]                 Patient Gender: F Patient Age:   78 years Exam Location:  [facility] Procedure:      VAS US  LOWER EXTREMITY VENOUS (DVT) Referring Phys: JULIE WILLIAMS --------------------------------------------------------------------------------  Indications: Edema.  Limitations: Poor ultrasound/tissue interface.  Comparison Study: Previous exam (reflux) on 02/10/2021 was negative for DVT Performing Technologist: Ezzie Potters RVT, RDMS  Examination Guidelines: A complete evaluation includes B-mode imaging, spectral Doppler, color Doppler, and power Doppler as needed of all accessible portions of each vessel. Bilateral testing is  considered an integral part of a complete examination. Limited examinations for reoccurring indications may be performed as noted. The reflux portion of the exam is performed with the patient in reverse Trendelenburg.  +---------+---------------+---------+-----------+----------+--------------+ RIGHT    CompressibilityPhasicitySpontaneityPropertiesThrombus Aging +---------+---------------+---------+-----------+----------+--------------+ CFV      Full           Yes      Yes                                 +---------+---------------+---------+-----------+----------+--------------+ SFJ      Full                                                        +---------+---------------+---------+-----------+----------+--------------+ FV Prox  Full           Yes      Yes                                 +---------+---------------+---------+-----------+----------+--------------+ FV Mid   Full           Yes      Yes                                 +---------+---------------+---------+-----------+----------+--------------+ FV DistalFull           Yes      Yes                                 +---------+---------------+---------+-----------+----------+--------------+ PFV      Full                                                        +---------+---------------+---------+-----------+----------+--------------+ POP      Full           Yes      Yes                                 +---------+---------------+---------+-----------+----------+--------------+ PTV      Full                                                         +---------+---------------+---------+-----------+----------+--------------+ PERO     Full                                                        +---------+---------------+---------+-----------+----------+--------------+   +---------+---------------+---------+-----------+----------+--------------+ LEFT     CompressibilityPhasicitySpontaneityPropertiesThrombus Aging +---------+---------------+---------+-----------+----------+--------------+ CFV      Full           Yes  Yes                                 +---------+---------------+---------+-----------+----------+--------------+ SFJ      Full                                                        +---------+---------------+---------+-----------+----------+--------------+ FV Prox  Full           Yes      Yes                                 +---------+---------------+---------+-----------+----------+--------------+ FV Mid   Full           Yes      Yes                                 +---------+---------------+---------+-----------+----------+--------------+ FV DistalFull           Yes      Yes                                 +---------+---------------+---------+-----------+----------+--------------+ PFV      Full                                                        +---------+---------------+---------+-----------+----------+--------------+ POP      Full           Yes      Yes                                 +---------+---------------+---------+-----------+----------+--------------+ PTV      Full                                                        +---------+---------------+---------+-----------+----------+--------------+ PERO     Full                                                        +---------+---------------+---------+-----------+----------+--------------+     Summary: BILATERAL: - No evidence of deep vein thrombosis seen in the lower extremities, bilaterally. -No evidence of  popliteal cyst, bilaterally.   *See table(s) above for measurements and observations. Electronically signed by Penne Colorado MD on 10/11/2023 at 5:03:56 PM.    Final    DG Shoulder Left Result Date: 10/11/2023 CLINICAL DATA:  Left shoulder pain EXAM: LEFT SHOULDER - 2+ VIEW COMPARISON:  11/18/2018 FINDINGS: There is no evidence of fracture or dislocation. There is no evidence of arthropathy or other focal bone abnormality. Soft tissues are  unremarkable. Changes of prior fixation in the mid humerus are seen. IMPRESSION: No acute abnormality noted. Electronically Signed   By: Oneil Devonshire M.D.   On: 10/11/2023 01:16   CT Thoracic Spine Wo Contrast Result Date: 10/10/2023 CLINICAL DATA:  Mid back pain EXAM: CT THORACIC SPINE WITHOUT CONTRAST TECHNIQUE: Multidetector CT images of the thoracic were obtained using the standard protocol without intravenous contrast. RADIATION DOSE REDUCTION: This exam was performed according to the departmental dose-optimization program which includes automated exposure control, adjustment of the mA and/or kV according to patient size and/or use of iterative reconstruction technique. COMPARISON:  None Available. FINDINGS: Alignment: Normal Vertebrae: No acute fracture or focal pathologic process. Paraspinal and other soft tissues: Negative. Disc levels: Early spurring anteriorly.  No disc herniation. IMPRESSION: No acute bony abnormality. Electronically Signed   By: Franky Crease M.D.   On: 10/10/2023 17:25   CT L-SPINE NO CHARGE Result Date: 10/10/2023 CLINICAL DATA:  Mid back pain EXAM: CT LUMBAR SPINE WITHOUT CONTRAST TECHNIQUE: Multidetector CT imaging of the lumbar spine was performed without intravenous contrast administration. Multiplanar CT image reconstructions were also generated. RADIATION DOSE REDUCTION: This exam was performed according to the departmental dose-optimization program which includes automated exposure control, adjustment of the mA and/or kV according to  patient size and/or use of iterative reconstruction technique. COMPARISON:  MRI 07/06/2023 FINDINGS: Segmentation: 5 lumbar type vertebrae. Alignment: Normal Vertebrae: There is slight compression fracture through the superior endplate of L2. Associated sclerosis noted in the superior L2 vertebral body suggests healing, but this is new since MRI 07/06/2023. No additional fracture. No focal suspicious osseous abnormality. Paraspinal and other soft tissues: Negative Disc levels: Broad-based disc bulges noted at L3-4 and L4-5. No focal disc herniation. Mild central canal stenosis at these levels. Diffuse degenerative facet disease. IMPRESSION: Slight compression fracture through the superior endplate of L2. This is age indeterminate, but new since 07/06/2023. Broad-based disc bulges with central canal stenosis at L3-4 and L4-5. Electronically Signed   By: Franky Crease M.D.   On: 10/10/2023 17:24   CT ABDOMEN PELVIS W CONTRAST Result Date: 10/10/2023 CLINICAL DATA:  Right lower quadrant pain EXAM: CT ABDOMEN AND PELVIS WITH CONTRAST TECHNIQUE: Multidetector CT imaging of the abdomen and pelvis was performed using the standard protocol following bolus administration of intravenous contrast. RADIATION DOSE REDUCTION: This exam was performed according to the departmental dose-optimization program which includes automated exposure control, adjustment of the mA and/or kV according to patient size and/or use of iterative reconstruction technique. CONTRAST:  75mL OMNIPAQUE IOHEXOL 350 MG/ML SOLN COMPARISON:  10/17/2020 FINDINGS: Lower chest: No acute abnormality Hepatobiliary: No focal liver abnormality is seen. Status post cholecystectomy. No biliary dilatation. Pancreas: No focal abnormality or ductal dilatation. Fatty replacement. Spleen: No focal abnormality.  Normal size. Adrenals/Urinary Tract: No adrenal abnormality. No focal renal abnormality. No stones or hydronephrosis. Urinary bladder is unremarkable.  Stomach/Bowel: Normal appendix. Stomach, large and small bowel grossly unremarkable. No bowel obstruction or inflammatory process. Vascular/Lymphatic: Aortic atherosclerosis. No evidence of aneurysm or adenopathy. Reproductive: Prior hysterectomy.  No adnexal masses. Other: No free fluid or free air. Musculoskeletal: No acute bony abnormality. IMPRESSION: No acute findings in the abdomen or pelvis. Aortic atherosclerosis. Electronically Signed   By: Franky Crease M.D.   On: 10/10/2023 17:20     Subjective: Patient was seen and examined at bedside.  Overnight events noted. Patient reports feeling much improved and wants to be discharged to skilled nursing facility.  Discharge Exam: Vitals:  10/13/23 0403 10/13/23 0809  BP: (!) 137/52 (!) 140/59  Pulse: 63 65  Resp: 16 (!) 22  Temp: 98.2 F (36.8 C) 98.8 F (37.1 C)  SpO2: 92% 92%   Vitals:   10/12/23 1953 10/12/23 2143 10/13/23 0403 10/13/23 0809  BP: (!) 150/54 (!) 160/59 (!) 137/52 (!) 140/59  Pulse: 70  63 65  Resp: 18  16 (!) 22  Temp: 98.4 F (36.9 C)  98.2 F (36.8 C) 98.8 F (37.1 C)  TempSrc:    Oral  SpO2: 96%  92% 92%  Weight:      Height:        General: Pt is alert, awake, not in acute distress Cardiovascular: RRR, S1/S2 +, no rubs, no gallops Respiratory: CTA bilaterally, no wheezing, no rhonchi Abdominal: Soft, NT, ND, bowel sounds + Extremities: no edema, no cyanosis    The results of significant diagnostics from this hospitalization (including imaging, microbiology, ancillary and laboratory) are listed below for reference.     Microbiology: No results found for this or any previous visit (from the past 240 hours).   Labs: BNP (last 3 results) No results for input(s): BNP in the last 8760 hours. Basic Metabolic Panel: Recent Labs  Lab 10/10/23 1459 10/11/23 0548  NA 139 137  K 3.4* 4.1  CL 96* 100  CO2 30 31  GLUCOSE 118* 122*  BUN 6* 5*  CREATININE 0.97 0.90  CALCIUM 9.0 8.5*  MG  --   2.0   Liver Function Tests: Recent Labs  Lab 10/10/23 1459  AST 26  ALT 20  ALKPHOS 63  BILITOT 0.5  PROT 6.5  ALBUMIN 3.4*   No results for input(s): LIPASE, AMYLASE in the last 168 hours. No results for input(s): AMMONIA in the last 168 hours. CBC: Recent Labs  Lab 10/10/23 1459  WBC 6.7  NEUTROABS 3.4  HGB 12.2  HCT 37.1  MCV 90.9  PLT 284   Cardiac Enzymes: No results for input(s): CKTOTAL, CKMB, CKMBINDEX, TROPONINI in the last 168 hours. BNP: Invalid input(s): POCBNP CBG: No results for input(s): GLUCAP in the last 168 hours. D-Dimer No results for input(s): DDIMER in the last 72 hours. Hgb A1c No results for input(s): HGBA1C in the last 72 hours. Lipid Profile No results for input(s): CHOL, HDL, LDLCALC, TRIG, CHOLHDL, LDLDIRECT in the last 72 hours. Thyroid  function studies No results for input(s): TSH, T4TOTAL, T3FREE, THYROIDAB in the last 72 hours.  Invalid input(s): FREET3 Anemia work up No results for input(s): VITAMINB12, FOLATE, FERRITIN, TIBC, IRON, RETICCTPCT in the last 72 hours. Urinalysis    Component Value Date/Time   COLORURINE STRAW (A) 10/10/2023 1632   APPEARANCEUR CLEAR 10/10/2023 1632   LABSPEC 1.004 (L) 10/10/2023 1632   PHURINE 8.0 10/10/2023 1632   GLUCOSEU NEGATIVE 10/10/2023 1632   HGBUR NEGATIVE 10/10/2023 1632   BILIRUBINUR NEGATIVE 10/10/2023 1632   KETONESUR NEGATIVE 10/10/2023 1632   PROTEINUR NEGATIVE 10/10/2023 1632   NITRITE NEGATIVE 10/10/2023 1632   LEUKOCYTESUR NEGATIVE 10/10/2023 1632   Sepsis Labs Recent Labs  Lab 10/10/23 1459  WBC 6.7   Microbiology No results found for this or any previous visit (from the past 240 hours).   Time coordinating discharge: Over 30 minutes  SIGNED:   Darcel Dawley, MD  [facility] 10/13/2023, 12:20 PM Pager   If 7PM-7AM, please contact night-coverage

## 2023-10-13 NOTE — TOC Transition Note (Signed)
 Transition of Care [facility]) - Discharge Note   Patient Details  Name: Lauren Roth MRN: [id number] Date of Birth: 10/04/1945  [facility]) CM/SW Contact:  Luann SHAUNNA Cumming, LCSW Phone Number: 10/13/2023, 12:38 PM   Clinical Narrative:     SNF shara is approved; auth# [id number] [facility] shara will likely be denied and is going to med review.   CSW met with pt and pt's daughter to update them; daughter will transport pt to SNF.     Per MD patient ready for DC to [facility]. RN, patient, patient's family, and facility notified of DC. Discharge Summary and FL2 sent to facility. RN to call report prior to discharge [phone]). DC packet on chart. Ambulance transport requested for patient.   CSW will sign off for now as social work intervention is no longer needed. Please consult us  again if new needs arise.   Final next level of care: Skilled Nursing Facility Barriers to Discharge: No Barriers Identified   Patient Goals and CMS Choice Patient states their goals for this hospitalization and ongoing recovery are:: SNF          Discharge Placement                Patient to be transferred to facility by: Daughter Name of family member notified: Daughter Patient and family notified of of transfer: 10/13/23  Discharge Plan and Services Additional resources added to the After Visit Summary for   In-house Referral: Clinical Social Work   Post Acute Care Choice: Skilled Nursing Facility                               Social Drivers of Health (SDOH) Interventions SDOH Screenings   Food Insecurity: No Food Insecurity (10/11/2023)  Housing: Low Risk  (10/11/2023)  Transportation Needs: No Transportation Needs (10/11/2023)  Utilities: Not At Risk (10/11/2023)  Depression (PHQ2-9): Low Risk  (07/18/2023)  Financial Resource Strain: Low Risk  (03/21/2023)  Physical Activity: Inactive (03/21/2023)  Social Connections: Socially Integrated (10/11/2023)  Stress: No  Stress Concern Present (03/21/2023)  Tobacco Use: Medium Risk (10/10/2023)  Health Literacy: Adequate Health Literacy (03/21/2023)     Readmission Risk Interventions     No data to display

## 2023-10-13 NOTE — TOC Progression Note (Signed)
 Transition of Care [facility]) - Progression Note    Patient Details  Name: Lauren Roth MRN: [id number] Date of Birth: [birth year]  Transition of Care [facility]) CM/SW Contact  Luann SHAUNNA [place], [place] Phone Number: 10/13/2023, 10:23 AM  Clinical Narrative:     CSW called HTA; shara is still pending.   Expected Discharge Plan: Skilled Nursing Facility Barriers to Discharge: Continued Medical Work up, English as a second language teacher  Expected Discharge Plan and Services In-house Referral: Clinical Social Work   Post Acute Care Choice: Skilled Nursing Facility Living arrangements for the past 2 months: Single Family Home                                       Social Determinants of Health (SDOH) Interventions SDOH Screenings   Food Insecurity: No Food Insecurity (10/11/2023)  Housing: Low Risk  (10/11/2023)  Transportation Needs: No Transportation Needs (10/11/2023)  Utilities: Not At Risk (10/11/2023)  Depression (PHQ2-9): Low Risk  (07/18/2023)  Financial Resource Strain: Low Risk  (03/21/2023)  Physical Activity: Inactive (03/21/2023)  Social Connections: Socially Integrated (10/11/2023)  Stress: No Stress Concern Present (03/21/2023)  Tobacco Use: Medium Risk (10/10/2023)  Health Literacy: Adequate Health Literacy (03/21/2023)    Readmission Risk Interventions     No data to display

## 2023-10-13 NOTE — Discharge Instructions (Signed)
 Advised to follow-up with primary care physician in 1 week. Advised to follow-up with pain management as scheduled.

## 2023-10-14 DIAGNOSIS — M797 Fibromyalgia: Secondary | ICD-10-CM | POA: Diagnosis not present

## 2023-10-14 DIAGNOSIS — R262 Difficulty in walking, not elsewhere classified: Secondary | ICD-10-CM | POA: Diagnosis not present

## 2023-10-14 DIAGNOSIS — G8929 Other chronic pain: Secondary | ICD-10-CM | POA: Diagnosis not present

## 2023-10-14 DIAGNOSIS — S32020A Wedge compression fracture of second lumbar vertebra, initial encounter for closed fracture: Secondary | ICD-10-CM | POA: Diagnosis not present

## 2023-10-17 DIAGNOSIS — M797 Fibromyalgia: Secondary | ICD-10-CM | POA: Diagnosis not present

## 2023-10-17 DIAGNOSIS — S32020A Wedge compression fracture of second lumbar vertebra, initial encounter for closed fracture: Secondary | ICD-10-CM | POA: Diagnosis not present

## 2023-10-17 DIAGNOSIS — G8929 Other chronic pain: Secondary | ICD-10-CM | POA: Diagnosis not present

## 2023-10-17 DIAGNOSIS — R262 Difficulty in walking, not elsewhere classified: Secondary | ICD-10-CM | POA: Diagnosis not present

## 2023-10-18 ENCOUNTER — Telehealth: Payer: Self-pay | Admitting: Family Medicine

## 2023-10-18 DIAGNOSIS — R6889 Other general symptoms and signs: Secondary | ICD-10-CM | POA: Diagnosis not present

## 2023-10-18 DIAGNOSIS — R300 Dysuria: Secondary | ICD-10-CM | POA: Diagnosis not present

## 2023-10-18 DIAGNOSIS — S32020A Wedge compression fracture of second lumbar vertebra, initial encounter for closed fracture: Secondary | ICD-10-CM | POA: Diagnosis not present

## 2023-10-18 NOTE — Telephone Encounter (Unsigned)
 Copied from CRM [id number]. Topic: Referral - Status >> Oct 18, 2023  1:26 PM Donee H wrote: Reason for CRM: Patient's daughter Katheryn called wanting a follow up on referral for patient to Rheumatology. Patient never received call from Rheumatology for an appointment and was told there would be a referral placed. Please give call back with follow up  with daughter Katheryn [phone]

## 2023-10-19 NOTE — Progress Notes (Deleted)
 Subjective:  Patient ID: Lauren Roth, female    DOB: 09-16-1945  Age: 78 y.o. MRN: [id number]  No chief complaint on file.   HPI:     07/18/2023    2:37 PM 03/21/2023    2:29 PM 02/03/2023    1:33 PM  Depression screen PHQ 2/9  Decreased Interest 0 0 0  Down, Depressed, Hopeless 0 0 0  PHQ - 2 Score 0 0 0  Altered sleeping  0   Tired, decreased energy  0   Change in appetite  0   Feeling bad or failure about yourself   0   Trouble concentrating  0   Moving slowly or fidgety/restless  0   Suicidal thoughts  0   PHQ-9 Score  0   Difficult doing work/chores  Not difficult at all         07/18/2023    2:37 PM  Fall Risk   Falls in the past year? 1  Number falls in past yr: 1  Injury with Fall? 1  Risk for fall due to : History of fall(s)    Patient Care Team: Sherre Clapper, MD as PCP - General (Family Medicine) Launie, Alexa Shad, MD as Referring Physician (Cardiology) Myrick Elspeth PARAS., DPM (Podiatry) Silva Elveria BIRCH (Inactive) as Referring Physician Powell Slater RIGGERS as Physician Assistant (Physician Assistant)   Review of Systems  Constitutional:  Negative for appetite change, fatigue and fever.  HENT:  Negative for congestion, ear pain, sinus pressure and sore throat.   Respiratory:  Negative for cough, chest tightness, shortness of breath and wheezing.   Cardiovascular:  Negative for chest pain and palpitations.  Gastrointestinal:  Negative for abdominal pain, constipation, diarrhea, nausea and vomiting.  Genitourinary:  Negative for dysuria and hematuria.  Musculoskeletal:  Negative for arthralgias, back pain, joint swelling and myalgias.  Skin:  Negative for rash.  Neurological:  Negative for dizziness, weakness and headaches.  Psychiatric/Behavioral:  Negative for dysphoric mood. The patient is not nervous/anxious.     Current Outpatient Medications on File Prior to Visit  Medication Sig Dispense Refill   aspirin  81 MG chewable  tablet Chew 81 mg by mouth daily.     Biotin 10 MG TABS Take by mouth.     buPROPion  (WELLBUTRIN  XL) 300 MG 24 hr tablet Take 1 tablet by mouth daily.     Calcium Carb-Cholecalciferol 500-10 MG-MCG TABS Take by mouth.     cloNIDine  (CATAPRES ) 0.1 MG tablet Take 1 tablet by mouth 2 (two) times daily.     diclofenac Sodium (VOLTAREN) 1 % GEL Apply 0.5 g topically 4 (four) times daily.     diltiazem  (CARDIZEM  CD) 120 MG 24 hr capsule Take 120 mg by mouth daily.     donepezil  (ARICEPT ) 10 MG tablet Take 1 tablet by mouth once daily 90 tablet 0   DULoxetine  (CYMBALTA ) 60 MG capsule Take 60 mg by mouth daily.     ergocalciferol (VITAMIN D2) 1.25 MG (50000 UT) capsule Take 50,000 Units by mouth once a week.     finasteride  (PROSCAR ) 5 MG tablet Take 5 mg by mouth daily.     fluticasone  (FLONASE ) 50 MCG/ACT nasal spray Place 1 spray into both nostrils daily as needed for allergies or rhinitis. 15.8 mL 1   furosemide  (LASIX ) 20 MG tablet Take 1 tablet by mouth twice daily 180 tablet 0   iron polysaccharides (NIFEREX) 150 MG capsule Take 150 mg by mouth daily.     levothyroxine  (  SYNTHROID ) 50 MCG tablet Take 1 tablet by mouth once daily 90 tablet 0   lidocaine (LIDODERM) 5 % Place onto the skin.     loratadine (CLARITIN) 10 MG tablet Take 10 mg by mouth daily.     losartan  (COZAAR ) 50 MG tablet Take 50 mg by mouth daily.     Misc Natural Products (FIBER 7 PO) Take by mouth.     montelukast (SINGULAIR) 10 MG tablet Take 10 mg by mouth at bedtime as needed (allergies).     nitroGLYCERIN  (NITROSTAT ) 0.4 MG SL tablet Place 1 tablet (0.4 mg total) under the tongue as needed for chest pain. 25 tablet 3   omeprazole  (PRILOSEC) 20 MG capsule Take 1 capsule (20 mg total) by mouth daily. 90 capsule 1   pregabalin  (LYRICA ) 75 MG capsule Take 75 mg by mouth at bedtime.     propranolol ER (INDERAL LA) 60 MG 24 hr capsule Take 60 mg by mouth daily.     psyllium (HYDROCIL/METAMUCIL) 95 % PACK Take 1 packet by mouth  at bedtime.     rOPINIRole  (REQUIP ) 1 MG tablet Take 1 tablet by mouth daily.     thiamine (VITAMIN B1) 100 MG tablet Take by mouth.     tiZANidine  (ZANAFLEX ) 4 MG tablet Take 4 mg by mouth at bedtime.     topiramate (TOPAMAX) 25 MG tablet Take 25 mg by mouth 2 (two) times daily.     traZODone  (DESYREL ) 100 MG tablet Take 100 mg by mouth at bedtime.     No current facility-administered medications on file prior to visit.   Past Medical History:  Diagnosis Date   Allergic rhinitis 01/16/2009   Qualifier: Diagnosis of  By: Kassie MD, Alyce DELENA Deal of this note might be different from the original. Overview:  Qualifier: Diagnosis of  By: Kassie MD, Alyce A   Arthritis    Chronic migraine w/o aura, not intractable, w/o stat migr    Chronic pain of right knee 06/12/2021   Chronic pain syndrome 07/30/2013   DEGENERATIVE DISC DISEASE, LUMBOSACRAL SPINE 01/16/2009   Qualifier: Diagnosis of  By: Kassie MD, Sean A    Depression    DIAPHORESIS 01/16/2009   Qualifier: Diagnosis of  By: Kassie MD, Sean A    Edema 01/16/2009   Qualifier: Diagnosis of  By: Kassie MD, Alyce DELENA   Formatting of this note might be different from the original. Overview:  Qualifier: Diagnosis of  By: Kassie MD, Sean A   Essential hypertension 01/28/2017   Essential thrombocytosis (HCC) 11/25/2017   Fibromyalgia    GERD 01/16/2009   Qualifier: Diagnosis of  By: Kassie MD, Sean A    Heart failure (HCC) 05/08/2021   Hypertension    HYPERTENSION 01/16/2009   Qualifier: Diagnosis of  By: Kassie MD, Sean A    Hypokalemia 01/16/2009   Qualifier: Diagnosis of  By: Kassie MD, Alyce DELENA   Formatting of this note might be different from the original. Overview:  Qualifier: Diagnosis of  By: Kassie MD, Sean A   Left hip pain 05/24/2019   Lumbosacral spondylosis 10/09/2010   Lymphedema 02/10/2021   Medication management 08/25/2010   Memory loss 01/16/2009   Qualifier: Diagnosis of  By: Kassie MD, Alyce DELENA   Formatting  of this note might be different from the original. Overview:  Qualifier: Diagnosis of  By: Kassie MD, Sean A   Menopausal and postmenopausal disorder 01/16/2009   Qualifier: Diagnosis of  By: Kassie MD, Alyce  A   Formatting of this note might be different from the original. Overview:  Qualifier: Diagnosis of  By: Kassie MD, Sean A   Moderate aortic regurgitation 05/21/2019   Neuropathic pain 09/25/2020   Neuropathy    Obesity (BMI 30.0-34.9) 10/13/2021   Pain in soft tissues of limb 06/20/2012   Formatting of this note might be different from the original. Overview:  IMPRESSION: Left knee pain Formatting of this note might be different from the original. IMPRESSION: Left knee pain   Pain in thoracic spine 07/30/2013   Palpitations 01/28/2017   Primary osteoarthritis of right knee 10/06/2014   Restless legs syndrome 08/25/2010   Shortness of breath on exertion 01/28/2017   SI (sacroiliac) pain 05/10/2016   Thoracic spondylosis 11/20/2010   Overview:  Overview:  IMPRESSION: Thoracic radiculopathy Overview:  IMPRESSION: Thoracic radiculopathy  Formatting of this note might be different from the original. Overview:  IMPRESSION: Thoracic radiculopathy Formatting of this note might be different from the original. IMPRESSION: Thoracic radiculopathy   Thrombocythemia, essential (HCC) 09/12/2013   Thyroid  disease    TMJ (dislocation of temporomandibular joint) 07/30/2013   Tremor 12/29/2021   Unspecified hypothyroidism 01/16/2009   Qualifier: Diagnosis of  By: Kassie MD, Sean A    Uterine cancer (HCC) 07/30/2013   Past Surgical History:  Procedure Laterality Date   APPENDECTOMY     CERVICAL DISC SURGERY     CHOLECYSTECTOMY     FRACTURE SURGERY Left    Left arm has a metal plate   KNEE SURGERY Bilateral    TKR   ROBOTIC ASSISTED LAP VAGINAL HYSTERECTOMY     did not take ovaries. had uterine cancer.    Family History  Problem Relation Age of Onset   Deep vein thrombosis Father     Heart disease Father    Heart failure Father    Alzheimer's disease Father    Heart disease Brother    Pulmonary embolism Brother    Heart disease Brother    Social History   Socioeconomic History   Marital status: Married    Spouse name: Not on file   Number of children: Not on file   Years of education: Not on file   Highest education level: GED or equivalent  Occupational History   Not on file  Tobacco Use   Smoking status: Former   Smokeless tobacco: Never   Tobacco comments:    at age 78  Vaping Use   Vaping status: Never Used  Substance and Sexual Activity   Alcohol use: No   Drug use: No   Sexual activity: Not on file  Other Topics Concern   Not on file  Social History Narrative   Not on file   Social Drivers of Health   Financial Resource Strain: Low Risk  (03/21/2023)   Overall Financial Resource Strain (CARDIA)    Difficulty of Paying Living Expenses: Not hard at all  Food Insecurity: No Food Insecurity (10/11/2023)   Hunger Vital Sign    Worried About Running Out of Food in the Last Year: Never true    Ran Out of Food in the Last Year: Never true  Transportation Needs: No Transportation Needs (10/11/2023)   PRAPARE - Administrator, Civil Service (Medical): No    Lack of Transportation (Non-Medical): No  Physical Activity: Inactive (03/21/2023)   Exercise Vital Sign    Days of Exercise per Week: 0 days    Minutes of Exercise per Session: 0 min  Stress:  No Stress Concern Present (03/21/2023)   [employer] of Occupational Health - Occupational Stress Questionnaire    Feeling of Stress : Not at all  Social Connections: Socially Integrated (10/11/2023)   Social Connection and Isolation Panel    Frequency of Communication with Friends and Family: More than three times a week    Frequency of Social Gatherings with Friends and Family: Twice a week    Attends Religious Services: More than 4 times per year    Active Member of Clubs or  Organizations: Yes    Attends Banker Meetings: More than 4 times per year    Marital Status: Married    Objective:  LMP  (LMP Unknown)      10/13/2023    8:09 AM 10/13/2023    4:03 AM 10/12/2023    9:43 PM  BP/Weight  Systolic BP 140 137 160  Diastolic BP 59 52 59    Physical Exam    Lab Results  Component Value Date   WBC 6.7 10/10/2023   HGB 12.2 10/10/2023   HCT 37.1 10/10/2023   PLT 284 10/10/2023   GLUCOSE 122 (H) 10/11/2023   CHOL 209 (H) 02/03/2023   TRIG 155 (H) 02/03/2023   HDL 44 02/03/2023   LDLCALC 137 (H) 02/03/2023   ALT 20 10/10/2023   AST 26 10/10/2023   NA 137 10/11/2023   K 4.1 10/11/2023   CL 100 10/11/2023   CREATININE 0.90 10/11/2023   BUN 5 (L) 10/11/2023   CO2 31 10/11/2023   TSH 1.870 02/03/2023   HGBA1C 5.5 02/03/2023      Assessment & Plan:  There are no diagnoses linked to this encounter.   No orders of the defined types were placed in this encounter.   No orders of the defined types were placed in this encounter.    Follow-up: No follow-ups on file.   I,Arienna Benegas M Joni Colegrove,acting as a Neurosurgeon for [employer], PA.,have documented all relevant documentation on the behalf of Nola Angles, PA,as directed by  Nola Angles, PA while in the presence of Nola Angles, [place].   An After Visit Summary was printed and given to the patient.  Nola Angles, [place] [facility] [phone]

## 2023-10-20 ENCOUNTER — Encounter: Payer: Self-pay | Admitting: Physician Assistant

## 2023-10-20 ENCOUNTER — Inpatient Hospital Stay: Admitting: Physician Assistant

## 2023-10-20 DIAGNOSIS — R300 Dysuria: Secondary | ICD-10-CM | POA: Diagnosis not present

## 2023-10-20 DIAGNOSIS — G8929 Other chronic pain: Secondary | ICD-10-CM | POA: Diagnosis not present

## 2023-10-26 DIAGNOSIS — S32020A Wedge compression fracture of second lumbar vertebra, initial encounter for closed fracture: Secondary | ICD-10-CM | POA: Diagnosis not present

## 2023-10-26 DIAGNOSIS — I110 Hypertensive heart disease with heart failure: Secondary | ICD-10-CM | POA: Diagnosis not present

## 2023-10-26 DIAGNOSIS — R262 Difficulty in walking, not elsewhere classified: Secondary | ICD-10-CM | POA: Diagnosis not present

## 2023-10-26 DIAGNOSIS — R6889 Other general symptoms and signs: Secondary | ICD-10-CM | POA: Diagnosis not present

## 2023-10-28 ENCOUNTER — Encounter: Payer: Self-pay | Admitting: Physician Assistant

## 2023-10-28 DIAGNOSIS — S32020D Wedge compression fracture of second lumbar vertebra, subsequent encounter for fracture with routine healing: Secondary | ICD-10-CM | POA: Diagnosis not present

## 2023-10-28 DIAGNOSIS — M797 Fibromyalgia: Secondary | ICD-10-CM | POA: Diagnosis not present

## 2023-10-28 DIAGNOSIS — J410 Simple chronic bronchitis: Secondary | ICD-10-CM | POA: Diagnosis not present

## 2023-10-28 DIAGNOSIS — K529 Noninfective gastroenteritis and colitis, unspecified: Secondary | ICD-10-CM | POA: Diagnosis not present

## 2023-10-28 DIAGNOSIS — I471 Supraventricular tachycardia, unspecified: Secondary | ICD-10-CM | POA: Diagnosis not present

## 2023-10-28 DIAGNOSIS — G2581 Restless legs syndrome: Secondary | ICD-10-CM | POA: Diagnosis not present

## 2023-10-28 DIAGNOSIS — M51379 Other intervertebral disc degeneration, lumbosacral region without mention of lumbar back pain or lower extremity pain: Secondary | ICD-10-CM | POA: Diagnosis not present

## 2023-10-28 DIAGNOSIS — G629 Polyneuropathy, unspecified: Secondary | ICD-10-CM | POA: Diagnosis not present

## 2023-10-28 DIAGNOSIS — F411 Generalized anxiety disorder: Secondary | ICD-10-CM | POA: Diagnosis not present

## 2023-10-28 DIAGNOSIS — K219 Gastro-esophageal reflux disease without esophagitis: Secondary | ICD-10-CM | POA: Diagnosis not present

## 2023-10-28 DIAGNOSIS — I5032 Chronic diastolic (congestive) heart failure: Secondary | ICD-10-CM | POA: Diagnosis not present

## 2023-10-28 DIAGNOSIS — I700 Atherosclerosis of aorta: Secondary | ICD-10-CM | POA: Diagnosis not present

## 2023-10-28 DIAGNOSIS — R7303 Prediabetes: Secondary | ICD-10-CM | POA: Diagnosis not present

## 2023-10-28 DIAGNOSIS — I890 Lymphedema, not elsewhere classified: Secondary | ICD-10-CM | POA: Diagnosis not present

## 2023-10-28 DIAGNOSIS — E876 Hypokalemia: Secondary | ICD-10-CM | POA: Diagnosis not present

## 2023-10-28 DIAGNOSIS — M533 Sacrococcygeal disorders, not elsewhere classified: Secondary | ICD-10-CM | POA: Diagnosis not present

## 2023-10-28 DIAGNOSIS — I110 Hypertensive heart disease with heart failure: Secondary | ICD-10-CM | POA: Diagnosis not present

## 2023-10-28 DIAGNOSIS — G43909 Migraine, unspecified, not intractable, without status migrainosus: Secondary | ICD-10-CM | POA: Diagnosis not present

## 2023-10-28 DIAGNOSIS — G894 Chronic pain syndrome: Secondary | ICD-10-CM | POA: Diagnosis not present

## 2023-10-28 DIAGNOSIS — J309 Allergic rhinitis, unspecified: Secondary | ICD-10-CM | POA: Diagnosis not present

## 2023-10-28 DIAGNOSIS — F329 Major depressive disorder, single episode, unspecified: Secondary | ICD-10-CM | POA: Diagnosis not present

## 2023-10-28 DIAGNOSIS — D473 Essential (hemorrhagic) thrombocythemia: Secondary | ICD-10-CM | POA: Diagnosis not present

## 2023-10-28 DIAGNOSIS — M47817 Spondylosis without myelopathy or radiculopathy, lumbosacral region: Secondary | ICD-10-CM | POA: Diagnosis not present

## 2023-10-28 DIAGNOSIS — E039 Hypothyroidism, unspecified: Secondary | ICD-10-CM | POA: Diagnosis not present

## 2023-10-30 ENCOUNTER — Other Ambulatory Visit: Payer: Self-pay | Admitting: Family Medicine

## 2023-10-31 ENCOUNTER — Telehealth: Payer: Self-pay

## 2023-10-31 ENCOUNTER — Ambulatory Visit: Payer: Self-pay

## 2023-10-31 NOTE — Telephone Encounter (Signed)
 Copied from CRM [id number]. Topic: Clinical - Pink Word Triage >> Oct 31, 2023 11:11 AM Berwyn MATSU wrote: Reason for Triage:  cough patient got it since she was at the rehab center

## 2023-10-31 NOTE — Telephone Encounter (Signed)
 Unable to reach pt after three attempts. Please advise.

## 2023-10-31 NOTE — Telephone Encounter (Signed)
 VO given to [place] for orders below.  Copied from CRM [id number]. Topic: Clinical - Home Health Verbal Orders >> Oct 31, 2023  8:47 AM Montie POUR wrote: Caller/Agency: Corean with [facility] Callback Number: [phone] - She has a secured voicemail Service Requested: Skilled Nursing Frequency: 1 week 4 time weekly; 2 PRN visits Any new concerns about the patient? No

## 2023-10-31 NOTE — Telephone Encounter (Signed)
 Spoke with Laurier and gave him verbal for 2 wk 4 and 1 wk 5. Also wanted to know if E-STEM was okay for patient back pain, verbalized it was okay per Dr. Sherre

## 2023-11-02 ENCOUNTER — Encounter: Payer: Self-pay | Admitting: Physician Assistant

## 2023-11-02 ENCOUNTER — Ambulatory Visit (INDEPENDENT_AMBULATORY_CARE_PROVIDER_SITE_OTHER): Admitting: Physician Assistant

## 2023-11-02 VITALS — BP 188/86 | HR 86 | Temp 97.4°F | Ht 64.0 in | Wt 202.0 lb

## 2023-11-02 DIAGNOSIS — M797 Fibromyalgia: Secondary | ICD-10-CM

## 2023-11-02 DIAGNOSIS — S32020D Wedge compression fracture of second lumbar vertebra, subsequent encounter for fracture with routine healing: Secondary | ICD-10-CM | POA: Diagnosis not present

## 2023-11-02 DIAGNOSIS — R7303 Prediabetes: Secondary | ICD-10-CM | POA: Diagnosis not present

## 2023-11-02 DIAGNOSIS — E079 Disorder of thyroid, unspecified: Secondary | ICD-10-CM | POA: Diagnosis not present

## 2023-11-02 DIAGNOSIS — I890 Lymphedema, not elsewhere classified: Secondary | ICD-10-CM

## 2023-11-02 MED ORDER — KETOROLAC TROMETHAMINE 60 MG/2ML IM SOLN
60.0000 mg | Freq: Once | INTRAMUSCULAR | Status: AC
  Administered 2023-11-02: 60 mg via INTRAMUSCULAR
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
## 2023-11-03 ENCOUNTER — Encounter: Payer: Self-pay | Admitting: Family Medicine

## 2023-11-03 ENCOUNTER — Ambulatory Visit: Admitting: Family Medicine

## 2023-11-03 VITALS — BP 108/58 | HR 46 | Temp 98.0°F | Ht 64.0 in | Wt 205.0 lb

## 2023-11-03 DIAGNOSIS — R600 Localized edema: Secondary | ICD-10-CM

## 2023-11-03 DIAGNOSIS — R001 Bradycardia, unspecified: Secondary | ICD-10-CM

## 2023-11-03 DIAGNOSIS — G43101 Migraine with aura, not intractable, with status migrainosus: Secondary | ICD-10-CM | POA: Diagnosis not present

## 2023-11-03 LAB — CBC WITH DIFFERENTIAL/PLATELET
Basophils Absolute: 0 x10E3/uL (ref 0.0–0.2)
Basos: 1 %
EOS (ABSOLUTE): 0 x10E3/uL (ref 0.0–0.4)
Eos: 0 %
Hematocrit: 41.2 % (ref 34.0–46.6)
Hemoglobin: 13.4 g/dL (ref 11.1–15.9)
Immature Grans (Abs): 0 x10E3/uL (ref 0.0–0.1)
Immature Granulocytes: 0 %
Lymphocytes Absolute: 1.6 x10E3/uL (ref 0.7–3.1)
Lymphs: 22 %
MCH: 29.8 pg (ref 26.6–33.0)
MCHC: 32.5 g/dL (ref 31.5–35.7)
MCV: 92 fL (ref 79–97)
Monocytes Absolute: 0.6 x10E3/uL (ref 0.1–0.9)
Monocytes: 8 %
Neutrophils Absolute: 5 x10E3/uL (ref 1.4–7.0)
Neutrophils: 69 %
Platelets: 369 x10E3/uL (ref 150–450)
RBC: 4.49 x10E6/uL (ref 3.77–5.28)
RDW: 12.7 % (ref 11.7–15.4)
WBC: 7.3 x10E3/uL (ref 3.4–10.8)

## 2023-11-03 LAB — COMPREHENSIVE METABOLIC PANEL WITH GFR
ALT: 15 IU/L (ref 0–32)
AST: 17 IU/L (ref 0–40)
Albumin: 4.5 g/dL (ref 3.8–4.8)
Alkaline Phosphatase: 96 IU/L (ref 44–121)
BUN/Creatinine Ratio: 15 (ref 12–28)
BUN: 13 mg/dL (ref 8–27)
Bilirubin Total: 0.2 mg/dL (ref 0.0–1.2)
CO2: 26 mmol/L (ref 20–29)
Calcium: 9.8 mg/dL (ref 8.7–10.3)
Chloride: 99 mmol/L (ref 96–106)
Creatinine, Ser: 0.89 mg/dL (ref 0.57–1.00)
Globulin, Total: 2.7 g/dL (ref 1.5–4.5)
Glucose: 124 mg/dL — ABNORMAL HIGH (ref 70–99)
Potassium: 4.4 mmol/L (ref 3.5–5.2)
Sodium: 140 mmol/L (ref 134–144)
Total Protein: 7.2 g/dL (ref 6.0–8.5)
eGFR: 67 mL/min/1.73 (ref >59)

## 2023-11-03 MED ORDER — FUROSEMIDE 20 MG PO TABS: 20.0000 mg | ORAL_TABLET | Freq: Two times a day (BID) | ORAL | 0 refills | Status: AC

## 2023-11-03 MED ORDER — TOPIRAMATE 25 MG PO TABS: 25.0000 mg | ORAL_TABLET | Freq: Two times a day (BID) | ORAL | 0 refills | Status: DC

## 2023-11-03 NOTE — Progress Notes (Unsigned)
 Subjective:  Patient ID: Lauren Roth, female    DOB: December 06, 1945  Age: 78 y.o. MRN: [id number]  Chief Complaint  Patient presents with  . Bradycardia    HPI:  Patient states her HR during OCCUPATIONAL THERAPY was in the 40's. OT BP 136/66, 44 manual Patient's only sx is fatigue.      07/18/2023    2:37 PM 03/21/2023    2:29 PM 02/03/2023    1:33 PM  Depression screen PHQ 2/9  Decreased Interest 0 0 0  Down, Depressed, Hopeless 0 0 0  PHQ - 2 Score 0 0 0  Altered sleeping  0   Tired, decreased energy  0   Change in appetite  0   Feeling bad or failure about yourself   0   Trouble concentrating  0   Moving slowly or fidgety/restless  0   Suicidal thoughts  0   PHQ-9 Score  0   Difficult doing work/chores  Not difficult at all         07/18/2023    2:37 PM  Fall Risk   Falls in the past year? 1  Number falls in past yr: 1  Injury with Fall? 1  Risk for fall due to : History of fall(s)    Patient Care Team: Sherre Clapper, MD as PCP - General (Family Medicine) Launie, Alexa Shad, MD as Referring Physician (Cardiology) Myrick Elspeth PARAS., DPM (Podiatry) Silva Elveria BIRCH (Inactive) as Referring Physician Powell Slater RIGGERS as Physician Assistant (Physician Assistant)   Review of Systems  Constitutional:  Positive for fatigue. Negative for chills and fever.  HENT:  Negative for congestion, ear pain, rhinorrhea and sore throat.   Respiratory:  Negative for cough and shortness of breath.   Cardiovascular:  Negative for chest pain.  Gastrointestinal:  Negative for abdominal pain, constipation, diarrhea, nausea and vomiting.  Genitourinary:  Negative for dysuria and urgency.  Musculoskeletal:  Negative for back pain and myalgias.  Skin:  Negative for rash.  Neurological:  Positive for weakness. Negative for dizziness, light-headedness and headaches.  Psychiatric/Behavioral:  Negative for dysphoric mood. The patient is not nervous/anxious.      Current Outpatient Medications on File Prior to Visit  Medication Sig Dispense Refill  . aspirin  81 MG chewable tablet Chew 81 mg by mouth daily.    . Biotin 10 MG TABS Take by mouth.    . buPROPion  (WELLBUTRIN  XL) 300 MG 24 hr tablet Take 1 tablet by mouth daily.    . Calcium Carb-Cholecalciferol 500-10 MG-MCG TABS Take by mouth.    . cloNIDine  (CATAPRES ) 0.1 MG tablet Take 1 tablet by mouth 2 (two) times daily.    . diclofenac Sodium (VOLTAREN) 1 % GEL Apply 0.5 g topically 4 (four) times daily.    . diltiazem  (CARDIZEM  CD) 120 MG 24 hr capsule Take 120 mg by mouth daily.    . donepezil  (ARICEPT ) 10 MG tablet Take 1 tablet by mouth once daily 90 tablet 0  . DULoxetine  (CYMBALTA ) 60 MG capsule Take 60 mg by mouth daily.    . ergocalciferol (VITAMIN D2) 1.25 MG (50000 UT) capsule Take 50,000 Units by mouth once a week.    . finasteride  (PROSCAR ) 5 MG tablet Take 5 mg by mouth daily.    . fluticasone  (FLONASE ) 50 MCG/ACT nasal spray Place 1 spray into both nostrils daily as needed for allergies or rhinitis. 15.8 mL 1  . furosemide  (LASIX ) 20 MG tablet Take 1 tablet by mouth twice  daily 180 tablet 0  . iron polysaccharides (NIFEREX) 150 MG capsule Take 150 mg by mouth daily.    . levothyroxine  (SYNTHROID ) 50 MCG tablet Take 1 tablet by mouth once daily 90 tablet 0  . lidocaine (LIDODERM) 5 % Place onto the skin.    SABRA loratadine (CLARITIN) 10 MG tablet Take 10 mg by mouth daily.    . losartan  (COZAAR ) 50 MG tablet Take 50 mg by mouth daily.    . Misc Natural Products (FIBER 7 PO) Take by mouth.    . montelukast (SINGULAIR) 10 MG tablet Take 10 mg by mouth at bedtime as needed (allergies).    . nitroGLYCERIN  (NITROSTAT ) 0.4 MG SL tablet Place 1 tablet (0.4 mg total) under the tongue as needed for chest pain. 25 tablet 3  . omeprazole  (PRILOSEC) 20 MG capsule Take 1 capsule (20 mg total) by mouth daily. 90 capsule 1  . pregabalin  (LYRICA ) 75 MG capsule Take 75 mg by mouth at bedtime.    .  propranolol ER (INDERAL LA) 60 MG 24 hr capsule Take 60 mg by mouth daily.    . psyllium (HYDROCIL/METAMUCIL) 95 % PACK Take 1 packet by mouth at bedtime.    . rOPINIRole  (REQUIP ) 1 MG tablet Take 1 tablet by mouth daily.    SABRA thiamine (VITAMIN B1) 100 MG tablet Take by mouth.    . tiZANidine  (ZANAFLEX ) 4 MG tablet Take 4 mg by mouth at bedtime.    . topiramate  (TOPAMAX ) 25 MG tablet Take 25 mg by mouth 2 (two) times daily.    . traZODone  (DESYREL ) 100 MG tablet Take 100 mg by mouth at bedtime.     No current facility-administered medications on file prior to visit.   Past Medical History:  Diagnosis Date  . Allergic rhinitis 01/16/2009   Qualifier: Diagnosis of  By: Kassie MD, Alyce LABOR   Formatting of this note might be different from the original. Overview:  Qualifier: Diagnosis of  By: Kassie MD, Alyce LABOR SABRA Arthritis   . Chronic migraine w/o aura, not intractable, w/o stat migr   . Chronic pain of right knee 06/12/2021  . Chronic pain syndrome 07/30/2013  . DEGENERATIVE DISC DISEASE, LUMBOSACRAL SPINE 01/16/2009   Qualifier: Diagnosis of  By: Kassie MD, Alyce LABOR   . Depression   . DIAPHORESIS 01/16/2009   Qualifier: Diagnosis of  By: Kassie MD, Sean A   . Edema 01/16/2009   Qualifier: Diagnosis of  By: Kassie MD, Alyce LABOR Deal of this note might be different from the original. Overview:  Qualifier: Diagnosis of  By: Kassie MD, Sean A  . Essential hypertension 01/28/2017  . Essential thrombocytosis (HCC) 11/25/2017  . Fibromyalgia   . GERD 01/16/2009   Qualifier: Diagnosis of  By: Kassie MD, Alyce LABOR   . Heart failure (HCC) 05/08/2021  . Hypertension   . HYPERTENSION 01/16/2009   Qualifier: Diagnosis of  By: Kassie MD, Alyce LABOR   . Hypokalemia 01/16/2009   Qualifier: Diagnosis of  By: Kassie MD, Alyce LABOR Deal of this note might be different from the original. Overview:  Qualifier: Diagnosis of  By: Kassie MD, Alyce LABOR SABRA Left hip pain 05/24/2019  . Lumbosacral  spondylosis 10/09/2010  . Lymphedema 02/10/2021  . Medication management 08/25/2010  . Memory loss 01/16/2009   Qualifier: Diagnosis of  By: Kassie MD, Alyce LABOR Deal of this note might be different from the original. Overview:  Qualifier: Diagnosis of  By: Kassie MD, Alyce DELENA QUAY Menopausal and postmenopausal disorder 01/16/2009   Qualifier: Diagnosis of  By: Kassie MD, Alyce DELENA   Formatting of this note might be different from the original. Overview:  Qualifier: Diagnosis of  By: Kassie MD, Alyce DELENA QUAY Moderate aortic regurgitation 05/21/2019  . Neuropathic pain 09/25/2020  . Neuropathy   . Obesity (BMI 30.0-34.9) 10/13/2021  . Pain in soft tissues of limb 06/20/2012   Formatting of this note might be different from the original. Overview:  IMPRESSION: Left knee pain Formatting of this note might be different from the original. IMPRESSION: Left knee pain  . Pain in thoracic spine 07/30/2013  . Palpitations 01/28/2017  . Primary osteoarthritis of right knee 10/06/2014  . Restless legs syndrome 08/25/2010  . Shortness of breath on exertion 01/28/2017  . SI (sacroiliac) pain 05/10/2016  . Thoracic spondylosis 11/20/2010   Overview:  Overview:  IMPRESSION: Thoracic radiculopathy Overview:  IMPRESSION: Thoracic radiculopathy  Formatting of this note might be different from the original. Overview:  IMPRESSION: Thoracic radiculopathy Formatting of this note might be different from the original. IMPRESSION: Thoracic radiculopathy  . Thrombocythemia, essential (HCC) 09/12/2013  . Thyroid  disease   . TMJ (dislocation of temporomandibular joint) 07/30/2013  . Tremor 12/29/2021  . Unspecified hypothyroidism 01/16/2009   Qualifier: Diagnosis of  By: Kassie MD, Alyce DELENA   . Uterine cancer (HCC) 07/30/2013   Past Surgical History:  Procedure Laterality Date  . APPENDECTOMY    . CERVICAL DISC SURGERY    . CHOLECYSTECTOMY    . FRACTURE SURGERY Left    Left arm has a metal plate  . KNEE SURGERY  Bilateral    TKR  . ROBOTIC ASSISTED LAP VAGINAL HYSTERECTOMY     did not take ovaries. had uterine cancer.    Family History  Problem Relation Age of Onset  . Deep vein thrombosis Father   . Heart disease Father   . Heart failure Father   . Alzheimer's disease Father   . Heart disease Brother   . Pulmonary embolism Brother   . Heart disease Brother    Social History   Socioeconomic History  . Marital status: Married    Spouse name: Not on file  . Number of children: Not on file  . Years of education: Not on file  . Highest education level: GED or equivalent  Occupational History  . Not on file  Tobacco Use  . Smoking status: Former  . Smokeless tobacco: Never  . Tobacco comments:    at age 35  Vaping Use  . Vaping status: Never Used  Substance and Sexual Activity  . Alcohol use: No  . Drug use: No  . Sexual activity: Not on file  Other Topics Concern  . Not on file  Social History Narrative  . Not on file   Social Drivers of Health   Financial Resource Strain: Low Risk  (03/21/2023)   Overall Financial Resource Strain (CARDIA)   . Difficulty of Paying Living Expenses: Not hard at all  Food Insecurity: No Food Insecurity (10/11/2023)   Hunger Vital Sign   . Worried About Programme researcher, broadcasting/film/video in the Last Year: Never true   . Ran Out of Food in the Last Year: Never true  Transportation Needs: No Transportation Needs (10/11/2023)   PRAPARE - Transportation   . Lack of Transportation (Medical): No   . Lack of Transportation (Non-Medical): No  Physical Activity: Inactive (03/21/2023)   Exercise Vital Sign   .  Days of Exercise per Week: 0 days   . Minutes of Exercise per Session: 0 min  Stress: No Stress Concern Present (03/21/2023)   [employer] of Occupational Health - Occupational Stress Questionnaire   . Feeling of Stress : Not at all  Social Connections: Socially Integrated (10/11/2023)   Social Connection and Isolation Panel   . Frequency of Communication  with Friends and Family: More than three times a week   . Frequency of Social Gatherings with Friends and Family: Twice a week   . Attends Religious Services: More than 4 times per year   . Active Member of Clubs or Organizations: Yes   . Attends Banker Meetings: More than 4 times per year   . Marital Status: Married    Objective:  Pulse (!) 46   Temp 98 F (36.7 C)   Ht 5' 4 (1.626 m)   Wt 205 lb (93 kg)   LMP  (LMP Unknown)   SpO2 96%   BMI 35.19 kg/m      11/03/2023    2:59 PM 11/02/2023    1:59 PM 10/13/2023    8:09 AM  BP/Weight  Systolic BP  188 859  Diastolic BP  86 59  Wt. (Lbs) 205 202   BMI 35.19 kg/m2 34.67 kg/m2     Physical Exam  {Perform Simple Foot Exam  Perform Detailed exam:1} {Insert foot Exam (Optional):30965}   Lab Results  Component Value Date   WBC 7.3 11/02/2023   HGB 13.4 11/02/2023   HCT 41.2 11/02/2023   PLT 369 11/02/2023   GLUCOSE 124 (H) 11/02/2023   CHOL 209 (H) 02/03/2023   TRIG 155 (H) 02/03/2023   HDL 44 02/03/2023   LDLCALC 137 (H) 02/03/2023   ALT 15 11/02/2023   AST 17 11/02/2023   NA 140 11/02/2023   K 4.4 11/02/2023   CL 99 11/02/2023   CREATININE 0.89 11/02/2023   BUN 13 11/02/2023   CO2 26 11/02/2023   TSH 1.870 02/03/2023   HGBA1C 5.5 02/03/2023      Assessment & Plan:  There are no diagnoses linked to this encounter.   No orders of the defined types were placed in this encounter.   No orders of the defined types were placed in this encounter.    Follow-up: No follow-ups on file.   I,Katherina A Bramblett,acting as a scribe for Harrie CHRISTELLA Cedar, FNP.,have documented all relevant documentation on the behalf of Harrie CHRISTELLA Cedar, FNP,as directed by  Harrie CHRISTELLA Cedar, FNP while in the presence of Harrie CHRISTELLA Cedar, FNP.   An After Visit Summary was printed and given to the patient.  Harrie CHRISTELLA Cedar, FNP [facility] [phone]

## 2023-11-04 DIAGNOSIS — R001 Bradycardia, unspecified: Secondary | ICD-10-CM | POA: Insufficient documentation

## 2023-11-04 NOTE — Assessment & Plan Note (Signed)
 Confusion regarding medication dosing, possible extra trazodone  dose. Lasix  refill needed. - Refill Lasix  prescription. - Review and clarify medication regimen. - Educate on proper medication management.  - Encourage continued use of compression socks and leg pumps.

## 2023-11-04 NOTE — Assessment & Plan Note (Signed)
 Refill requested on migraine medication for continued management.

## 2023-11-04 NOTE — Assessment & Plan Note (Addendum)
 Hypotension and bradycardia likely due to medication interaction or overdose, possibly from trazodone . Propranolol held. Differential includes medication-induced bradycardia. EKG completed, SB, Ventricular Rate 42, PR - 174, QRS - 72, QTc - 395 No ST elevation or depression.  - Hold propranolol. - Contact cardiologist Dr. Launie for management discussion. Spoke with nurse, Barnie BEAL @ Dr. Burke office regarding bradycardia and hypotension. She reported that at her last appointment her HR was 45. She said she will call the patient to set up an appointment with Dr. Launie and discuss the possibility of pacemaker.  - Monitor heart rate and blood pressure closely. - Ensure proper medication management to prevent dosing errors.

## 2023-11-06 ENCOUNTER — Ambulatory Visit: Payer: Self-pay | Admitting: Physician Assistant

## 2023-11-07 DIAGNOSIS — R609 Edema, unspecified: Secondary | ICD-10-CM | POA: Diagnosis not present

## 2023-11-07 DIAGNOSIS — R0602 Shortness of breath: Secondary | ICD-10-CM | POA: Diagnosis not present

## 2023-11-07 DIAGNOSIS — T50905A Adverse effect of unspecified drugs, medicaments and biological substances, initial encounter: Secondary | ICD-10-CM | POA: Diagnosis not present

## 2023-11-07 DIAGNOSIS — E079 Disorder of thyroid, unspecified: Secondary | ICD-10-CM | POA: Diagnosis not present

## 2023-11-07 DIAGNOSIS — E119 Type 2 diabetes mellitus without complications: Secondary | ICD-10-CM | POA: Diagnosis not present

## 2023-11-07 DIAGNOSIS — I471 Supraventricular tachycardia, unspecified: Secondary | ICD-10-CM | POA: Diagnosis not present

## 2023-11-07 DIAGNOSIS — I110 Hypertensive heart disease with heart failure: Secondary | ICD-10-CM | POA: Diagnosis not present

## 2023-11-07 DIAGNOSIS — I5032 Chronic diastolic (congestive) heart failure: Secondary | ICD-10-CM | POA: Diagnosis not present

## 2023-11-07 DIAGNOSIS — R001 Bradycardia, unspecified: Secondary | ICD-10-CM | POA: Diagnosis not present

## 2023-11-07 NOTE — Telephone Encounter (Signed)
 Copied from CRM [id number]. Topic: Clinical - Lab/Test Results >> Nov 07, 2023  2:45 PM Emylou G wrote: Reason for CRM: adv patient of lab results

## 2023-11-08 ENCOUNTER — Telehealth: Payer: Self-pay | Admitting: Family Medicine

## 2023-11-08 NOTE — Telephone Encounter (Signed)
 [facility]-PLAN OF CARE 10/28/23 TO 12/26/23-PUT IN DR [facility] BOX TO SIGN

## 2023-11-10 NOTE — Assessment & Plan Note (Signed)
 Chronic pain exacerbated by vertebral fracture. Current regimen insufficient. History of stronger pain medications. Stress and difficulty with daily activities due to pain. - Coordinate with pain management to optimize pain control regimen. - Administer Toradol  injection for immediate pain relief. - Monitor blood pressure and stress levels.

## 2023-11-10 NOTE — Assessment & Plan Note (Signed)
 Lymphedema with significant swelling and pain in ankles and legs. Uses pumps and leg wraps with irritation from prolonged use. - Continue use of lymphedema pumps and leg wraps as tolerated.

## 2023-11-10 NOTE — Assessment & Plan Note (Signed)
 Spontaneous vertebral fracture with significant pain. Surgery dismissed in favor of pain management and rehabilitation. Prefers non-surgical management. - Administer Toradol  injection for pain relief. - Order blood work as recommended by the hospital. - Coordinate with pain management for ongoing pain control. - Consider MRI if imaging shows new occult fracture or nerve pressure.

## 2023-11-10 NOTE — Assessment & Plan Note (Signed)
 Suggested for follow up labs after release from rehab facility Continue to work on exercises Labs drawn today

## 2023-11-11 DIAGNOSIS — M51379 Other intervertebral disc degeneration, lumbosacral region without mention of lumbar back pain or lower extremity pain: Secondary | ICD-10-CM | POA: Diagnosis not present

## 2023-11-11 DIAGNOSIS — D473 Essential (hemorrhagic) thrombocythemia: Secondary | ICD-10-CM | POA: Diagnosis not present

## 2023-11-11 DIAGNOSIS — I110 Hypertensive heart disease with heart failure: Secondary | ICD-10-CM | POA: Diagnosis not present

## 2023-11-11 DIAGNOSIS — M797 Fibromyalgia: Secondary | ICD-10-CM | POA: Diagnosis not present

## 2023-11-11 DIAGNOSIS — K219 Gastro-esophageal reflux disease without esophagitis: Secondary | ICD-10-CM | POA: Diagnosis not present

## 2023-11-11 DIAGNOSIS — J410 Simple chronic bronchitis: Secondary | ICD-10-CM | POA: Diagnosis not present

## 2023-11-11 DIAGNOSIS — S32020D Wedge compression fracture of second lumbar vertebra, subsequent encounter for fracture with routine healing: Secondary | ICD-10-CM | POA: Diagnosis not present

## 2023-11-11 DIAGNOSIS — I5032 Chronic diastolic (congestive) heart failure: Secondary | ICD-10-CM | POA: Diagnosis not present

## 2023-11-11 DIAGNOSIS — I700 Atherosclerosis of aorta: Secondary | ICD-10-CM | POA: Diagnosis not present

## 2023-11-11 DIAGNOSIS — F329 Major depressive disorder, single episode, unspecified: Secondary | ICD-10-CM | POA: Diagnosis not present

## 2023-11-11 DIAGNOSIS — G629 Polyneuropathy, unspecified: Secondary | ICD-10-CM | POA: Diagnosis not present

## 2023-11-11 DIAGNOSIS — E039 Hypothyroidism, unspecified: Secondary | ICD-10-CM | POA: Diagnosis not present

## 2023-11-14 LAB — SPECIMEN STATUS REPORT

## 2023-11-14 LAB — TSH: TSH: 1.21 u[IU]/mL (ref 0.450–4.500)

## 2023-11-17 ENCOUNTER — Other Ambulatory Visit: Payer: Self-pay | Admitting: Family Medicine

## 2023-11-18 ENCOUNTER — Ambulatory Visit: Payer: Self-pay | Admitting: *Deleted

## 2023-11-18 DIAGNOSIS — F411 Generalized anxiety disorder: Secondary | ICD-10-CM | POA: Diagnosis not present

## 2023-11-18 DIAGNOSIS — F331 Major depressive disorder, recurrent, moderate: Secondary | ICD-10-CM | POA: Diagnosis not present

## 2023-11-18 NOTE — Telephone Encounter (Signed)
   FYI Only or Action Required?: Action required by provider: request for appointment.  Patient was last seen in primary care on 11/03/2023 by Teressa Harrie HERO, FNP.  Called Nurse Triage reporting Pain.  Symptoms began several months ago.  Interventions attempted: Prescription medications: oxycodone  .  Symptoms are: gradually worsening.  Triage Disposition: See PCP Within 2 Weeks  Patient/caregiver understands and will follow disposition?: Yes               Copied from CRM [id number]. Topic: Clinical - Red Word Triage >> Nov 18, 2023  3:32 PM Tiffini S wrote: Red Word that prompted transfer to Nurse Triage: Patient are having pain/ aches all over the body- have home rehab and persist cough. Patient is taking oxycodone  that is not helping with the pain. Transferred call to triage nurse. Reason for Disposition  Body pains are a chronic symptom (recurrent or ongoing AND present > 4 weeks)  Answer Assessment - Initial Assessment Questions Patient requesting appt for persistent cough after c/o pain all over and chronic body aches and pain. Reports therapy not working, oxycodone  not helping anymore for pain. Recommended if cough worsening or pain worsening go to UC/ED. Patient denies chest pain no SOB. Appt scheduled for 11/21/23        1. ONSET: When did the muscle aches or body pains start?      On going and now persistent cough since discharge from Rehab in June 2. LOCATION: What part of your body is hurting? (e.g., entire body, arms, legs)      All over 3. SEVERITY: How bad is the pain? (Scale 1-10; or mild, moderate, severe)     Moderate - severe 4. CAUSE: What do you think is causing the pains?     Not sure  5. FEVER: Do you have a fever? If Yes, ask: What is your temperature, how was it measured, and  when did it start?      Na  6. OTHER SYMPTOMS: Do you have any other symptoms? (e.g., chest pain, cold or flu symptoms, rash, weakness, weight loss)      Back pain from fracture, pain in hips, shoulders, ankles, muscle joints pain pain turning neck. Balance is off. Persistent cough  7. PREGNANCY: Is there any chance you are pregnant? When was your last menstrual period?     na 8. TRAVEL: Have you traveled out of the country in the last month? (e.g., exposures, travel history)     na  Protocols used: Muscle Aches and Body Pain-A-AH

## 2023-11-21 ENCOUNTER — Ambulatory Visit (INDEPENDENT_AMBULATORY_CARE_PROVIDER_SITE_OTHER): Admitting: Family Medicine

## 2023-11-21 ENCOUNTER — Encounter: Payer: Self-pay | Admitting: Family Medicine

## 2023-11-21 VITALS — BP 102/60 | HR 63 | Temp 97.8°F | Resp 16 | Ht 64.0 in | Wt 212.2 lb

## 2023-11-21 DIAGNOSIS — M797 Fibromyalgia: Secondary | ICD-10-CM | POA: Diagnosis not present

## 2023-11-21 DIAGNOSIS — M25552 Pain in left hip: Secondary | ICD-10-CM | POA: Diagnosis not present

## 2023-11-21 DIAGNOSIS — R062 Wheezing: Secondary | ICD-10-CM | POA: Insufficient documentation

## 2023-11-21 DIAGNOSIS — M79641 Pain in right hand: Secondary | ICD-10-CM

## 2023-11-21 DIAGNOSIS — M25551 Pain in right hip: Secondary | ICD-10-CM | POA: Insufficient documentation

## 2023-11-21 DIAGNOSIS — R059 Cough, unspecified: Secondary | ICD-10-CM | POA: Insufficient documentation

## 2023-11-21 DIAGNOSIS — M79642 Pain in left hand: Secondary | ICD-10-CM | POA: Diagnosis not present

## 2023-11-21 LAB — POC INFLUENZA A&B (BINAX/QUICKVUE)
Influenza A, POC: NEGATIVE
Influenza B, POC: NEGATIVE

## 2023-11-21 LAB — POC COVID19 BINAXNOW: SARS Coronavirus 2 Ag: NEGATIVE

## 2023-11-21 MED ORDER — PREGABALIN 75 MG PO CAPS: 75.0000 mg | ORAL_CAPSULE | Freq: Three times a day (TID) | ORAL | 2 refills | Status: DC

## 2023-11-21 MED ORDER — GUAIFENESIN-DM 100-10 MG/5ML PO SYRP: 5.0000 mL | ORAL_SOLUTION | ORAL | 0 refills | Status: DC | PRN

## 2023-11-21 MED ORDER — ALBUTEROL SULFATE HFA 108 (90 BASE) MCG/ACT IN AERS: 2.0000 | INHALATION_SPRAY | Freq: Four times a day (QID) | RESPIRATORY_TRACT | 2 refills | Status: AC | PRN

## 2023-11-21 NOTE — Assessment & Plan Note (Signed)
 Chronic management with Claritin, Singulair, and Flonase . Current symptoms not significantly improved with Flonase . - Continue current allergy medications. - Advise continued use of Flonase .

## 2023-11-21 NOTE — Assessment & Plan Note (Signed)
 Elevated rheumatoid factor and mildly high sed rate and C-reactive protein suggest rheumatoid arthritis. Awaiting lab confirmation. - Repeat rheumatoid arthritis panel. - order xrays of the hips - Plan referral to rheumatologist based on lab results.

## 2023-11-21 NOTE — Assessment & Plan Note (Addendum)
 Flu and Covid - negative Chronic cough with wheezing and diminished lung sounds Persistent dry cough for four weeks with negative COVID-19 test. Differential includes pneumonia despite negative chest x-ray. Shortness of breath and fatigue noted. - Order chest x-ray to rule out pneumonia. - Prescribe albuterol  inhaler with spacer. - Discuss potential side effects of albuterol , including jitteriness and thrush. - Advise use of over-the-counter remedies such as honey, lemon, and Delsym. - Prescribe Mucinex  cough medicine. - Recommend use of humidified air and increased water intake. - Provide order for incentive spirometer.

## 2023-11-21 NOTE — Patient Instructions (Signed)
  VISIT SUMMARY: During your visit, we discussed your persistent dry cough, chronic pain, suspected rheumatoid arthritis, and allergic rhinitis. We reviewed your symptoms, current medications, and recent test results to develop a comprehensive plan to address your health concerns.  YOUR PLAN: -CHRONIC COUGH WITH WHEEZING AND DIMINISHED LUNG SOUNDS: A chronic cough can be caused by various factors, including infections or chronic conditions. We will order a chest x-ray to rule out pneumonia and prescribe an albuterol  inhaler with a spacer to help with your breathing. You should be aware of potential side effects like jitteriness and thrush. Additionally, you can use over-the-counter remedies such as honey, lemon, and Delsym, and we recommend using humidified air and increasing your water intake. We also prescribed Mucinex  cough medicine and provided an order for an incentive spirometer to help improve your lung function.  -CHRONIC PAIN SYNDROME WITH FIBROMYALGIA AND CHRONIC HIP PAIN: Chronic pain syndrome involves long-term pain that can affect your daily life. We will increase your Lyrica  dosage to 75 mg three times daily and continue your current Cymbalta  dosage. We also ordered hip x-rays to further investigate your hip pain.  -SUSPECTED RHEUMATOID ARTHRITIS: Rheumatoid arthritis is an autoimmune condition that causes inflammation in your joints. Your recent tests showed elevated markers that suggest this condition. We will repeat the rheumatoid arthritis panel and plan to refer you to a rheumatologist based on the lab results.  -ALLERGIC RHINITIS: Allergic rhinitis is an allergic reaction that causes sneezing, congestion, and a runny nose. You should continue taking your current allergy medications, including Claritin, Singulair, and Flonase , as they help manage your symptoms.  INSTRUCTIONS: Please follow up with the chest x-ray as soon as possible to rule out pneumonia. Use the albuterol  inhaler  with the spacer as prescribed and monitor for any side effects. Continue using over-the-counter remedies and the prescribed Mucinex . Use the incentive spirometer as directed. Increase your Lyrica  dosage to 75 mg three times daily and continue your current Cymbalta  dosage. Get the hip x-rays done to investigate your hip pain further. We will repeat the rheumatoid arthritis panel and refer you to a rheumatologist based on the results. Continue taking your allergy medications as prescribed.                                   Contains text generated by [employer].

## 2023-11-21 NOTE — Progress Notes (Signed)
 Acute Office Visit  Subjective:    Patient ID: Lauren Roth, female    DOB: Aug 30, 1945, 78 y.o.   MRN: [id number]  Chief Complaint  Patient presents with   Cough   Discussed the use of AI scribe software for clinical note transcription with the patient, who gave verbal consent to proceed.  History of Present Illness   Lauren Roth is a 78 year old female with chronic bronchitis who presents with a persistent dry cough for four weeks.  She has experienced a persistent dry cough for four weeks, which began after moving from [facility] to a rest home for rehabilitation. The cough is dry, causes a choking sensation, and leads to frequent coughing spells. No fever or other systemic symptoms are present. She occasionally experiences shortness of breath and fatigue after coughing spells, which sometimes cause pain.  She has only used cough drops for relief and has not tried any over-the-counter medications. She uses Flonase  regularly, but it does not seem to help with the cough. She has a history of using albuterol  with a spacer for previous respiratory issues but has lost the spacer. She denies any new medications in the past four weeks, continuing her use of losartan , Claritin, and Singulair. Claritin helps with her allergies.  A chest x-ray performed approximately six  weeks ago at the rest home did not show any pneumonia.  She is currently taking Lyrica  75 mg once at night. She is also on Cymbalta  60 mg twice a day, prescribed by her psychiatric doctor during a recent video visit.  She has a history of rheumatoid arthritis, with recent tests at [facility] rehab indicating elevated rheumatoid factor, sed rate, and C-reactive protein. She has not had any x-rays of her hips.       Past Medical History:  Diagnosis Date   Allergic rhinitis 01/16/2009   Qualifier: Diagnosis of  By: Kassie MD, Alyce DELENA Deal of this note might be different from the original.  Overview:  Qualifier: Diagnosis of  By: Kassie MD, Alyce A   Arthritis    Chronic migraine w/o aura, not intractable, w/o stat migr    Chronic pain of right knee 06/12/2021   Chronic pain syndrome 07/30/2013   DEGENERATIVE DISC DISEASE, LUMBOSACRAL SPINE 01/16/2009   Qualifier: Diagnosis of  By: Kassie MD, Alyce A    Depression    DIAPHORESIS 01/16/2009   Qualifier: Diagnosis of  By: Kassie MD, Sean A    Edema 01/16/2009   Qualifier: Diagnosis of  By: Kassie MD, Alyce DELENA   Formatting of this note might be different from the original. Overview:  Qualifier: Diagnosis of  By: Kassie MD, Sean A   Essential hypertension 01/28/2017   Essential thrombocytosis (HCC) 11/25/2017   Fibromyalgia    GERD 01/16/2009   Qualifier: Diagnosis of  By: Kassie MD, Sean A    Heart failure (HCC) 05/08/2021   Hypertension    HYPERTENSION 01/16/2009   Qualifier: Diagnosis of  By: Kassie MD, Sean A    Hypokalemia 01/16/2009   Qualifier: Diagnosis of  By: Kassie MD, Alyce DELENA   Formatting of this note might be different from the original. Overview:  Qualifier: Diagnosis of  By: Kassie MD, Sean A   Left hip pain 05/24/2019   Lumbosacral spondylosis 10/09/2010   Lymphedema 02/10/2021   Medication management 08/25/2010   Memory loss 01/16/2009   Qualifier: Diagnosis of  By: Kassie MD, Alyce DELENA Deal of this  note might be different from the original. Overview:  Qualifier: Diagnosis of  By: Kassie MD, Sean A   Menopausal and postmenopausal disorder 01/16/2009   Qualifier: Diagnosis of  By: Kassie MD, Alyce LABOR   Formatting of this note might be different from the original. Overview:  Qualifier: Diagnosis of  By: Kassie MD, Sean A   Moderate aortic regurgitation 05/21/2019   Neuropathic pain 09/25/2020   Neuropathy    Obesity (BMI 30.0-34.9) 10/13/2021   Pain in soft tissues of limb 06/20/2012   Formatting of this note might be different from the original. Overview:  IMPRESSION: Left knee pain Formatting  of this note might be different from the original. IMPRESSION: Left knee pain   Pain in thoracic spine 07/30/2013   Palpitations 01/28/2017   Primary osteoarthritis of right knee 10/06/2014   Restless legs syndrome 08/25/2010   Shortness of breath on exertion 01/28/2017   SI (sacroiliac) pain 05/10/2016   Thoracic spondylosis 11/20/2010   Overview:  Overview:  IMPRESSION: Thoracic radiculopathy Overview:  IMPRESSION: Thoracic radiculopathy  Formatting of this note might be different from the original. Overview:  IMPRESSION: Thoracic radiculopathy Formatting of this note might be different from the original. IMPRESSION: Thoracic radiculopathy   Thrombocythemia, essential (HCC) 09/12/2013   Thyroid  disease    TMJ (dislocation of temporomandibular joint) 07/30/2013   Tremor 12/29/2021   Unspecified hypothyroidism 01/16/2009   Qualifier: Diagnosis of  By: Kassie MD, Sean A    Uterine cancer (HCC) 07/30/2013    Past Surgical History:  Procedure Laterality Date   APPENDECTOMY     CERVICAL DISC SURGERY     CHOLECYSTECTOMY     FRACTURE SURGERY Left    Left arm has a metal plate   KNEE SURGERY Bilateral    TKR   ROBOTIC ASSISTED LAP VAGINAL HYSTERECTOMY     did not take ovaries. had uterine cancer.    Family History  Problem Relation Age of Onset   Deep vein thrombosis Father    Heart disease Father    Heart failure Father    Alzheimer's disease Father    Heart disease Brother    Pulmonary embolism Brother    Heart disease Brother     Social History   Socioeconomic History   Marital status: Married    Spouse name: Not on file   Number of children: Not on file   Years of education: Not on file   Highest education level: GED or equivalent  Occupational History   Not on file  Tobacco Use   Smoking status: Former   Smokeless tobacco: Never   Tobacco comments:    at age 36  Vaping Use   Vaping status: Never Used  Substance and Sexual Activity   Alcohol use: No   Drug  use: No   Sexual activity: Not on file  Other Topics Concern   Not on file  Social History Narrative   Not on file   Social Drivers of Health   Financial Resource Strain: Low Risk  (03/21/2023)   Overall Financial Resource Strain (CARDIA)    Difficulty of Paying Living Expenses: Not hard at all  Food Insecurity: No Food Insecurity (10/11/2023)   Hunger Vital Sign    Worried About Running Out of Food in the Last Year: Never true    Ran Out of Food in the Last Year: Never true  Transportation Needs: No Transportation Needs (10/11/2023)   PRAPARE - Administrator, Civil Service (Medical): No  Lack of Transportation (Non-Medical): No  Physical Activity: Inactive (03/21/2023)   Exercise Vital Sign    Days of Exercise per Week: 0 days    Minutes of Exercise per Session: 0 min  Stress: No Stress Concern Present (03/21/2023)   [employer] of Occupational Health - Occupational Stress Questionnaire    Feeling of Stress : Not at all  Social Connections: Socially Integrated (10/11/2023)   Social Connection and Isolation Panel    Frequency of Communication with Friends and Family: More than three times a week    Frequency of Social Gatherings with Friends and Family: Twice a week    Attends Religious Services: More than 4 times per year    Active Member of [employer] or Organizations: Yes    Attends Engineer, structural: More than 4 times per year    Marital Status: Married  Catering manager Violence: Not At Risk (10/11/2023)   Humiliation, Afraid, Rape, and Kick questionnaire    Fear of Current or Ex-Partner: No    Emotionally Abused: No    Physically Abused: No    Sexually Abused: No    Outpatient Medications Prior to Visit  Medication Sig Dispense Refill   aspirin  81 MG chewable tablet Chew 81 mg by mouth daily.     Biotin 10 MG TABS Take by mouth.     buprenorphine  (SUBUTEX ) 2 MG SUBL SL tablet Place 2 mg under the tongue daily.     buPROPion  (WELLBUTRIN  XL) 300  MG 24 hr tablet Take 1 tablet by mouth daily.     Calcium Carb-Cholecalciferol 500-10 MG-MCG TABS Take by mouth.     cloNIDine  (CATAPRES ) 0.1 MG tablet Take 1 tablet by mouth 2 (two) times daily.     diclofenac Sodium (VOLTAREN) 1 % GEL Apply 0.5 g topically 4 (four) times daily.     diltiazem  (CARDIZEM  CD) 120 MG 24 hr capsule Take 120 mg by mouth daily.     DULoxetine  (CYMBALTA ) 60 MG capsule Take 60 mg by mouth daily.     ergocalciferol (VITAMIN D2) 1.25 MG (50000 UT) capsule Take 50,000 Units by mouth once a week.     finasteride  (PROSCAR ) 5 MG tablet Take 5 mg by mouth daily.     fluticasone  (FLONASE ) 50 MCG/ACT nasal spray Place 1 spray into both nostrils daily as needed for allergies or rhinitis. 15.8 mL 1   furosemide  (LASIX ) 20 MG tablet Take 1 tablet (20 mg total) by mouth 2 (two) times daily. 180 tablet 0   iron polysaccharides (NIFEREX) 150 MG capsule Take 150 mg by mouth daily.     levothyroxine  (SYNTHROID ) 50 MCG tablet Take 1 tablet by mouth once daily 90 tablet 0   lidocaine (LIDODERM) 5 % Place onto the skin.     loratadine (CLARITIN) 10 MG tablet Take 10 mg by mouth daily.     losartan  (COZAAR ) 50 MG tablet Take 50 mg by mouth daily.     Misc Natural Products (FIBER 7 PO) Take by mouth.     montelukast (SINGULAIR) 10 MG tablet Take 10 mg by mouth at bedtime as needed (allergies).     nitroGLYCERIN  (NITROSTAT ) 0.4 MG SL tablet Place 1 tablet (0.4 mg total) under the tongue as needed for chest pain. 25 tablet 3   omeprazole  (PRILOSEC) 20 MG capsule Take 1 capsule (20 mg total) by mouth daily. 90 capsule 1   psyllium (HYDROCIL/METAMUCIL) 95 % PACK Take 1 packet by mouth at bedtime.     rOPINIRole  (REQUIP )  1 MG tablet Take 1 tablet by mouth daily.     thiamine (VITAMIN B1) 100 MG tablet Take by mouth.     tiZANidine  (ZANAFLEX ) 4 MG tablet Take 4 mg by mouth at bedtime.     topiramate  (TOPAMAX ) 25 MG tablet Take 1 tablet (25 mg total) by mouth 2 (two) times daily. 180 tablet 0    traZODone  (DESYREL ) 100 MG tablet Take 100 mg by mouth at bedtime.     donepezil  (ARICEPT ) 10 MG tablet Take 1 tablet by mouth once daily 90 tablet 0   pregabalin  (LYRICA ) 75 MG capsule Take 75 mg by mouth at bedtime.     No facility-administered medications prior to visit.    Allergies  Allergen Reactions   Cottonseed Oil Hives and Itching    Hives, Itching Raw Cotton    Codeine Sulfate Nausea And Vomiting    Review of Systems  Constitutional:  Negative for chills, diaphoresis, fatigue and fever.  HENT:  Negative for congestion, ear pain and sinus pain.   Eyes: Negative.   Respiratory:  Positive for cough, shortness of breath and wheezing. Negative for chest tightness.   Cardiovascular:  Negative for chest pain.  Gastrointestinal:  Negative for abdominal pain, constipation, diarrhea, nausea and vomiting.  Endocrine: Negative.   Genitourinary:  Negative for dysuria.  Musculoskeletal:  Positive for arthralgias (hips) and myalgias (all over).  Allergic/Immunologic: Negative.   Neurological:  Negative for dizziness, weakness, light-headedness and headaches.  Hematological: Negative.   Psychiatric/Behavioral:  Negative for dysphoric mood. The patient is not nervous/anxious.        Objective:        11/21/2023    1:51 PM 11/03/2023    2:59 PM 11/02/2023    1:59 PM  Vitals with BMI  Height 5' 4 5' 4 5' 4  Weight 212 lbs 3 oz 205 lbs 202 lbs  BMI 36.41 35.17 34.66  Systolic 102 108 811  Diastolic 60 58 86  Pulse 63 46 86    No data found.   Physical Exam Constitutional:      General: She is not in acute distress.    Appearance: Normal appearance.  HENT:     Right Ear: Tympanic membrane normal.     Left Ear: Tympanic membrane normal.     Nose: Nose normal.     Mouth/Throat:     Pharynx: No posterior oropharyngeal erythema.  Eyes:     Conjunctiva/sclera: Conjunctivae normal.  Cardiovascular:     Rate and Rhythm: Normal rate and regular rhythm.     Heart sounds:  Normal heart sounds. No murmur heard. Pulmonary:     Effort: Pulmonary effort is normal.     Breath sounds: Examination of the right-lower field reveals decreased breath sounds and wheezing. Examination of the left-lower field reveals decreased breath sounds. Decreased breath sounds and wheezing present.  Abdominal:     General: Bowel sounds are normal.     Palpations: Abdomen is soft.     Tenderness: There is no abdominal tenderness.  Skin:    General: Skin is warm.  Neurological:     Mental Status: She is alert. Mental status is at baseline.  Psychiatric:        Mood and Affect: Mood normal.        Behavior: Behavior normal.     Health Maintenance Due  Topic Date Due   Hepatitis C Screening  Never done   Zoster Vaccines- Shingrix (1 of 2) Never done   DEXA  SCAN  Never done   COVID-19 Vaccine (5 - Moderna risk 2024-25 season) 08/04/2023   INFLUENZA VACCINE  11/18/2023    There are no preventive care reminders to display for this patient.   Lab Results  Component Value Date   TSH 1.210 11/02/2023   Lab Results  Component Value Date   WBC 7.3 11/02/2023   HGB 13.4 11/02/2023   HCT 41.2 11/02/2023   MCV 92 11/02/2023   PLT 369 11/02/2023   Lab Results  Component Value Date   NA 140 11/02/2023   K 4.4 11/02/2023   CO2 26 11/02/2023   GLUCOSE 124 (H) 11/02/2023   BUN 13 11/02/2023   CREATININE 0.89 11/02/2023   BILITOT 0.2 11/02/2023   ALKPHOS 96 11/02/2023   AST 17 11/02/2023   ALT 15 11/02/2023   PROT 7.2 11/02/2023   ALBUMIN 4.5 11/02/2023   CALCIUM 9.8 11/02/2023   ANIONGAP 6 10/11/2023   EGFR 67 11/02/2023   Lab Results  Component Value Date   CHOL 209 (H) 02/03/2023   Lab Results  Component Value Date   HDL 44 02/03/2023   Lab Results  Component Value Date   LDLCALC 137 (H) 02/03/2023   Lab Results  Component Value Date   TRIG 155 (H) 02/03/2023   Lab Results  Component Value Date   CHOLHDL 4.8 (H) 02/03/2023   Lab Results  Component  Value Date   HGBA1C 5.5 02/03/2023       Assessment & Plan:  Cough, unspecified type Assessment & Plan: Flu and Covid - negative Chronic cough with wheezing and diminished lung sounds Persistent dry cough for four weeks with negative COVID-19 test. Differential includes pneumonia despite negative chest x-ray. Shortness of breath and fatigue noted. - Order chest x-ray to rule out pneumonia. - Prescribe albuterol  inhaler with spacer. - Discuss potential side effects of albuterol , including jitteriness and thrush. - Advise use of over-the-counter remedies such as honey, lemon, and Delsym. - Prescribe Mucinex  cough medicine. - Recommend use of humidified air and increased water intake. - Provide order for incentive spirometer.  Orders: -     POC COVID-19 BinaxNow -     POC Influenza A&B(BINAX/QUICKVUE) -     guaiFENesin -DM; Take 5 mLs by mouth every 4 (four) hours as needed for cough.  Dispense: 118 mL; Refill: 0  Wheezing Assessment & Plan: Wheezing on auscultation.  Shortness of breath and fatigue noted. - Order chest x-ray to rule out pneumonia. - Prescribe albuterol  inhaler with spacer. - Discuss potential side effects of albuterol , including jitteriness and thrush.  Orders: -     Albuterol  Sulfate HFA; Inhale 2 puffs into the lungs every 6 (six) hours as needed for wheezing or shortness of breath.  Dispense: 8 g; Refill: 2 -     DG Chest 2 View; Future  Fibromyalgia Assessment & Plan: Chronic pain syndrome with fibromyalgia and chronic hip pain Chronic pain managed with Lyrica  and Cymbalta . Recent psychiatric consultation led to increased Cymbalta  dosage. No recent hip x-rays. - Increase Lyrica  to 75 mg three times daily. - Order hip x-rays. - Continue current Cymbalta  dosage.  Orders: -     Pregabalin ; Take 1 capsule (75 mg total) by mouth 3 (three) times daily.  Dispense: 90 capsule; Refill: 2  Acute pain of both hips Assessment & Plan: Elevated rheumatoid factor  and mildly high sed rate and C-reactive protein suggest rheumatoid arthritis. Awaiting lab confirmation. - Repeat rheumatoid arthritis panel. - order xrays of the hips -  Plan referral to rheumatologist based on lab results.  Orders: -     DG HIP UNILAT W OR W/O PELVIS 2-3 VIEWS RIGHT; Future -     DG HIP UNILAT W OR W/O PELVIS 2-3 VIEWS LEFT; Future  Pain in both hands -     Rheumatoid factor -     CYCLIC CITRUL PEPTIDE ANTIBODY, IGG/IGA -     Sedimentation rate -     C-reactive protein -     ANA w/Reflex       Follow-up: Return in about 8 weeks (around 01/16/2024) for chronic, 40 min, fasting.  An After Visit Summary was printed and given to the patient.  Harrie Cedar, FNP [facility] [phone]

## 2023-11-21 NOTE — Assessment & Plan Note (Signed)
 Chronic pain syndrome with fibromyalgia and chronic hip pain Chronic pain managed with Lyrica  and Cymbalta . Recent psychiatric consultation led to increased Cymbalta  dosage. No recent hip x-rays. - Increase Lyrica  to 75 mg three times daily. - Order hip x-rays. - Continue current Cymbalta  dosage.

## 2023-11-21 NOTE — Assessment & Plan Note (Signed)
 Wheezing on auscultation.  Shortness of breath and fatigue noted. - Order chest x-ray to rule out pneumonia. - Prescribe albuterol  inhaler with spacer. - Discuss potential side effects of albuterol , including jitteriness and thrush.

## 2023-11-22 ENCOUNTER — Ambulatory Visit: Payer: Self-pay | Admitting: Family Medicine

## 2023-11-22 ENCOUNTER — Other Ambulatory Visit: Payer: Self-pay | Admitting: Family Medicine

## 2023-11-22 ENCOUNTER — Ambulatory Visit (HOSPITAL_BASED_OUTPATIENT_CLINIC_OR_DEPARTMENT_OTHER)
Admission: RE | Admit: 2023-11-22 | Discharge: 2023-11-22 | Disposition: A | Discharge status: Home / Self Care | Source: Ambulatory Visit | Attending: Family Medicine | Admitting: Family Medicine

## 2023-11-22 DIAGNOSIS — M79641 Pain in right hand: Secondary | ICD-10-CM

## 2023-11-22 DIAGNOSIS — R0602 Shortness of breath: Secondary | ICD-10-CM | POA: Diagnosis not present

## 2023-11-22 DIAGNOSIS — R062 Wheezing: Secondary | ICD-10-CM

## 2023-11-22 DIAGNOSIS — R059 Cough, unspecified: Secondary | ICD-10-CM | POA: Diagnosis not present

## 2023-11-22 DIAGNOSIS — M25552 Pain in left hip: Secondary | ICD-10-CM

## 2023-11-22 DIAGNOSIS — M797 Fibromyalgia: Secondary | ICD-10-CM

## 2023-11-22 DIAGNOSIS — M160 Bilateral primary osteoarthritis of hip: Secondary | ICD-10-CM | POA: Diagnosis not present

## 2023-11-22 DIAGNOSIS — M25551 Pain in right hip: Secondary | ICD-10-CM | POA: Diagnosis not present

## 2023-11-22 DIAGNOSIS — I700 Atherosclerosis of aorta: Secondary | ICD-10-CM | POA: Diagnosis not present

## 2023-11-22 LAB — CYCLIC CITRUL PEPTIDE ANTIBODY, IGG/IGA: Cyclic Citrullin Peptide Ab: 8 U (ref 0–19)

## 2023-11-22 LAB — ENA+DNA/DS+SJORGEN'S
ENA RNP Ab: >8 AI — ABNORMAL HIGH (ref 0.0–0.9)
ENA SM Ab Ser-aCnc: <0.2 AI (ref 0.0–0.9)
ENA SSA (RO) Ab: <0.2 AI (ref 0.0–0.9)
ENA SSB (LA) Ab: <0.2 AI (ref 0.0–0.9)
dsDNA Ab: <1 [IU]/mL (ref 0–9)

## 2023-11-22 LAB — SEDIMENTATION RATE: Sed Rate: 10 mm/h (ref 0–40)

## 2023-11-22 LAB — C-REACTIVE PROTEIN: CRP: 3 mg/L (ref 0–10)

## 2023-11-22 LAB — ANA W/REFLEX: Anti Nuclear Antibody (ANA): POSITIVE — AB

## 2023-11-22 LAB — RHEUMATOID FACTOR: Rheumatoid fact SerPl-aCnc: 42.5 [IU]/mL — ABNORMAL HIGH (ref <14.0)

## 2023-11-30 ENCOUNTER — Encounter: Payer: Self-pay | Admitting: Family Medicine

## 2023-11-30 ENCOUNTER — Telehealth (INDEPENDENT_AMBULATORY_CARE_PROVIDER_SITE_OTHER): Admitting: Family Medicine

## 2023-11-30 VITALS — BP 124/53 | HR 71 | Resp 20 | Ht 64.0 in | Wt 210.0 lb

## 2023-11-30 DIAGNOSIS — I082 Rheumatic disorders of both aortic and tricuspid valves: Secondary | ICD-10-CM | POA: Diagnosis not present

## 2023-11-30 DIAGNOSIS — M0580 Other rheumatoid arthritis with rheumatoid factor of unspecified site: Secondary | ICD-10-CM | POA: Diagnosis not present

## 2023-11-30 DIAGNOSIS — R760 Raised antibody titer: Secondary | ICD-10-CM | POA: Insufficient documentation

## 2023-11-30 DIAGNOSIS — M25552 Pain in left hip: Secondary | ICD-10-CM

## 2023-11-30 DIAGNOSIS — M25551 Pain in right hip: Secondary | ICD-10-CM | POA: Diagnosis not present

## 2023-11-30 DIAGNOSIS — G894 Chronic pain syndrome: Secondary | ICD-10-CM

## 2023-11-30 DIAGNOSIS — I272 Pulmonary hypertension, unspecified: Secondary | ICD-10-CM | POA: Diagnosis not present

## 2023-11-30 DIAGNOSIS — I5032 Chronic diastolic (congestive) heart failure: Secondary | ICD-10-CM

## 2023-11-30 DIAGNOSIS — I10 Essential (primary) hypertension: Secondary | ICD-10-CM

## 2023-11-30 DIAGNOSIS — R269 Unspecified abnormalities of gait and mobility: Secondary | ICD-10-CM

## 2023-11-30 DIAGNOSIS — Z780 Asymptomatic menopausal state: Secondary | ICD-10-CM

## 2023-11-30 DIAGNOSIS — Z13820 Encounter for screening for osteoporosis: Secondary | ICD-10-CM | POA: Diagnosis not present

## 2023-11-30 MED ORDER — PREDNISONE 10 MG PO TABS: 10.0000 mg | ORAL_TABLET | Freq: Every day | ORAL | 0 refills | Status: DC

## 2023-11-30 NOTE — Progress Notes (Unsigned)
 Virtual Visit via Video Note   This visit type was conducted per patient request This format is felt to be appropriate for this patient at this time.  All issues noted in this document were discussed and addressed.  A limited physical exam was performed with this format.  A verbal consent was obtained for the virtual visit.   Date:  12/05/2023   ID:  Lauren Roth, DOB August 06, 1945, MRN [id number]  Patient Location: Home Provider Location: Office/Clinic  PCP:  Sherre Clapper, MD   Chief Complaint  Patient presents with   Pain Management    Severe muscle and joint pain     History of Present Illness:    Discussed the use of AI scribe software for clinical note transcription with the patient, who gave verbal consent to proceed.  History of Present Illness   Lauren Roth is a 78 year old female who presents with concerns about possible rheumatoid arthritis and lupus.  Concerns for autoimmune disease - Concerned about possible rheumatoid arthritis and lupus due to recent blood work showing elevated rheumatoid factor - continued joint pain.   Musculoskeletal pain - Takes pregabalin  (Lyrica ) 75 mg three times daily for fibromyalgia pain - Uncertain about the effectiveness of pregabalin  for her pain  Imaging studies - Hip x-rays have been performed but results have not yet been reviewed.   Patient also has dyspnea on exertion and had a cxr which was normal.   They expressed frustration with having to pick up medicines at different times and would like this to be better organized. Patient also needs more assistance at home.       Past Medical History:  Diagnosis Date   Allergic rhinitis 01/16/2009   Qualifier: Diagnosis of  By: Kassie MD, Alyce DELENA Deal of this note might be different from the original. Overview:  Qualifier: Diagnosis of  By: Kassie MD, Alyce A   Arthritis    Chronic migraine w/o aura, not intractable, w/o stat migr    Chronic  pain of right knee 06/12/2021   Chronic pain syndrome 07/30/2013   DEGENERATIVE DISC DISEASE, LUMBOSACRAL SPINE 01/16/2009   Qualifier: Diagnosis of  By: Kassie MD, Sean A    Depression    DIAPHORESIS 01/16/2009   Qualifier: Diagnosis of  By: Kassie MD, Sean A    Edema 01/16/2009   Qualifier: Diagnosis of  By: Kassie MD, Alyce DELENA   Formatting of this note might be different from the original. Overview:  Qualifier: Diagnosis of  By: Kassie MD, Sean A   Essential hypertension 01/28/2017   Essential thrombocytosis (HCC) 11/25/2017   Fibromyalgia    GERD 01/16/2009   Qualifier: Diagnosis of  By: Kassie MD, Sean A    Heart failure (HCC) 05/08/2021   HYPERTENSION 01/16/2009   Qualifier: Diagnosis of  By: Kassie MD, Sean A    Hypokalemia 01/16/2009   Qualifier: Diagnosis of  By: Kassie MD, Alyce DELENA   Formatting of this note might be different from the original. Overview:  Qualifier: Diagnosis of  By: Kassie MD, Sean A   Left hip pain 05/24/2019   Lumbosacral spondylosis 10/09/2010   Lymphedema 02/10/2021   Memory loss 01/16/2009   Qualifier: Diagnosis of  By: Kassie MD, Alyce DELENA   Formatting of this note might be different from the original. Overview:  Qualifier: Diagnosis of  By: Kassie MD, Sean A   Menopausal and postmenopausal disorder 01/16/2009   Qualifier: Diagnosis of  By: Kassie  MD, Alyce LABOR   Formatting of this note might be different from the original. Overview:  Qualifier: Diagnosis of  By: Kassie MD, Sean A   Moderate aortic regurgitation 05/21/2019   Neuropathic pain 09/25/2020   Neuropathy    Obesity (BMI 30.0-34.9) 10/13/2021   Pain in soft tissues of limb 06/20/2012   Formatting of this note might be different from the original. Overview:  IMPRESSION: Left knee pain Formatting of this note might be different from the original. IMPRESSION: Left knee pain   Pain in thoracic spine 07/30/2013   Palpitations 01/28/2017   Primary osteoarthritis of right knee 10/06/2014    Restless legs syndrome 08/25/2010   Shortness of breath on exertion 01/28/2017   SI (sacroiliac) pain 05/10/2016   Thoracic spondylosis 11/20/2010   Overview:  Overview:  IMPRESSION: Thoracic radiculopathy Overview:  IMPRESSION: Thoracic radiculopathy  Formatting of this note might be different from the original. Overview:  IMPRESSION: Thoracic radiculopathy Formatting of this note might be different from the original. IMPRESSION: Thoracic radiculopathy   Thrombocythemia, essential (HCC) 09/12/2013   Thyroid  disease    TMJ (dislocation of temporomandibular joint) 07/30/2013   Tremor 12/29/2021   Unspecified hypothyroidism 01/16/2009   Qualifier: Diagnosis of  By: Kassie MD, Sean A    Uterine cancer (HCC) 07/30/2013    Past Surgical History:  Procedure Laterality Date   APPENDECTOMY     CERVICAL DISC SURGERY     CHOLECYSTECTOMY     FRACTURE SURGERY Left    Left arm has a metal plate   KNEE SURGERY Bilateral    TKR   ROBOTIC ASSISTED LAP VAGINAL HYSTERECTOMY     did not take ovaries. had uterine cancer.    Family History  Problem Relation Age of Onset   Deep vein thrombosis Father    Heart disease Father    Heart failure Father    Alzheimer's disease Father    Heart disease Brother    Pulmonary embolism Brother    Heart disease Brother     Social History   Socioeconomic History   Marital status: Married    Spouse name: Not on file   Number of children: Not on file   Years of education: Not on file   Highest education level: GED or equivalent  Occupational History   Not on file  Tobacco Use   Smoking status: Former   Smokeless tobacco: Never   Tobacco comments:    at age 3  Vaping Use   Vaping status: Never Used  Substance and Sexual Activity   Alcohol use: No   Drug use: No   Sexual activity: Not on file  Other Topics Concern   Not on file  Social History Narrative   Not on file   Social Drivers of Health   Financial Resource Strain: Low Risk   (03/21/2023)   Overall Financial Resource Strain (CARDIA)    Difficulty of Paying Living Expenses: Not hard at all  Food Insecurity: No Food Insecurity (10/11/2023)   Hunger Vital Sign    Worried About Running Out of Food in the Last Year: Never true    Ran Out of Food in the Last Year: Never true  Transportation Needs: No Transportation Needs (10/11/2023)   PRAPARE - Administrator, Civil Service (Medical): No    Lack of Transportation (Non-Medical): No  Physical Activity: Inactive (03/21/2023)   Exercise Vital Sign    Days of Exercise per Week: 0 days    Minutes of Exercise per  Session: 0 min  Stress: No Stress Concern Present (03/21/2023)   [employer] of Occupational Health - Occupational Stress Questionnaire    Feeling of Stress : Not at all  Social Connections: Socially Integrated (10/11/2023)   Social Connection and Isolation Panel    Frequency of Communication with Friends and Family: More than three times a week    Frequency of Social Gatherings with Friends and Family: Twice a week    Attends Religious Services: More than 4 times per year    Active Member of [employer] or Organizations: Yes    Attends Engineer, structural: More than 4 times per year    Marital Status: Married  Catering manager Violence: Not At Risk (10/11/2023)   Humiliation, Afraid, Rape, and Kick questionnaire    Fear of Current or Ex-Partner: No    Emotionally Abused: No    Physically Abused: No    Sexually Abused: No    Outpatient Medications Prior to Visit  Medication Sig Dispense Refill   albuterol  (VENTOLIN  HFA) 108 (90 Base) MCG/ACT inhaler Inhale 2 puffs into the lungs every 6 (six) hours as needed for wheezing or shortness of breath. 8 g 2   aspirin  81 MG chewable tablet Chew 81 mg by mouth daily.     Biotin 10 MG TABS Take by mouth.     buprenorphine  (SUBUTEX ) 2 MG SUBL SL tablet Place 2 mg under the tongue daily.     buPROPion  (WELLBUTRIN  XL) 300 MG 24 hr tablet Take 1  tablet by mouth daily.     Calcium Carb-Cholecalciferol 500-10 MG-MCG TABS Take by mouth.     cloNIDine  (CATAPRES ) 0.1 MG tablet Take 1 tablet by mouth 2 (two) times daily.     diclofenac Sodium (VOLTAREN) 1 % GEL Apply 0.5 g topically 4 (four) times daily.     diltiazem  (CARDIZEM  CD) 120 MG 24 hr capsule Take 120 mg by mouth daily.     DULoxetine  (CYMBALTA ) 60 MG capsule Take 60 mg by mouth daily.     ergocalciferol (VITAMIN D2) 1.25 MG (50000 UT) capsule Take 50,000 Units by mouth once a week.     finasteride  (PROSCAR ) 5 MG tablet Take 5 mg by mouth daily.     fluticasone  (FLONASE ) 50 MCG/ACT nasal spray Place 1 spray into both nostrils daily as needed for allergies or rhinitis. 15.8 mL 1   furosemide  (LASIX ) 20 MG tablet Take 1 tablet (20 mg total) by mouth 2 (two) times daily. 180 tablet 0   guaiFENesin -dextromethorphan (ROBITUSSIN DM) 100-10 MG/5ML syrup Take 5 mLs by mouth every 4 (four) hours as needed for cough. 118 mL 0   iron polysaccharides (NIFEREX) 150 MG capsule Take 150 mg by mouth daily.     levothyroxine  (SYNTHROID ) 50 MCG tablet Take 1 tablet by mouth once daily 90 tablet 0   lidocaine (LIDODERM) 5 % Place onto the skin.     loratadine (CLARITIN) 10 MG tablet Take 10 mg by mouth daily.     losartan  (COZAAR ) 50 MG tablet Take 50 mg by mouth daily.     Misc Natural Products (FIBER 7 PO) Take by mouth.     montelukast (SINGULAIR) 10 MG tablet Take 10 mg by mouth at bedtime as needed (allergies).     nitroGLYCERIN  (NITROSTAT ) 0.4 MG SL tablet Place 1 tablet (0.4 mg total) under the tongue as needed for chest pain. 25 tablet 3   omeprazole  (PRILOSEC) 20 MG capsule Take 1 capsule (20 mg total) by mouth daily.  90 capsule 1   pregabalin  (LYRICA ) 75 MG capsule Take 1 capsule (75 mg total) by mouth 3 (three) times daily. 90 capsule 2   psyllium (HYDROCIL/METAMUCIL) 95 % PACK Take 1 packet by mouth at bedtime.     rOPINIRole  (REQUIP ) 1 MG tablet Take 1 tablet by mouth daily.      thiamine (VITAMIN B1) 100 MG tablet Take by mouth.     tiZANidine  (ZANAFLEX ) 4 MG tablet Take 4 mg by mouth at bedtime.     topiramate  (TOPAMAX ) 25 MG tablet Take 1 tablet (25 mg total) by mouth 2 (two) times daily. 180 tablet 0   traZODone  (DESYREL ) 100 MG tablet Take 100 mg by mouth at bedtime.     No facility-administered medications prior to visit.    Allergies  Allergen Reactions   Cottonseed Oil Hives and Itching    Hives, Itching Raw Cotton    Codeine Sulfate Nausea And Vomiting     Social History   Tobacco Use   Smoking status: Former   Smokeless tobacco: Never   Tobacco comments:    at age 43  Vaping Use   Vaping status: Never Used  Substance Use Topics   Alcohol use: No   Drug use: No     Review of Systems  Constitutional:  Positive for malaise/fatigue. Negative for chills, diaphoresis and fever.  HENT:  Negative for congestion, ear pain and sore throat.   Respiratory:  Positive for shortness of breath. Negative for cough and sputum production.   Cardiovascular:  Negative for chest pain and palpitations.  Gastrointestinal:  Negative for abdominal pain, constipation, diarrhea, nausea and vomiting.  Genitourinary:  Negative for dysuria and urgency.  Musculoskeletal:  Positive for joint pain and myalgias.  Neurological:  Negative for dizziness and headaches.  Psychiatric/Behavioral:  Negative for depression. The patient is not nervous/anxious.      Labs/Other Tests and Data Reviewed:    Recent Labs: 10/11/2023: Magnesium 2.0 11/02/2023: ALT 15; BUN 13; Creatinine, Ser 0.89; Hemoglobin 13.4; Platelets 369; Potassium 4.4; Sodium 140; TSH 1.210   Recent Lipid Panel Lab Results  Component Value Date/Time   CHOL 209 (H) 02/03/2023 03:27 PM   TRIG 155 (H) 02/03/2023 03:27 PM   HDL 44 02/03/2023 03:27 PM   CHOLHDL 4.8 (H) 02/03/2023 03:27 PM   LDLCALC 137 (H) 02/03/2023 03:27 PM    Wt Readings from Last 3 Encounters:  11/30/23 210 lb (95.3 kg)  11/21/23 212  lb 3.2 oz (96.3 kg)  11/03/23 205 lb (93 kg)     Objective:    Vital Signs:  BP (!) 124/53   Pulse 71   Resp 20   Ht 5' 4 (1.626 m)   Wt 210 lb (95.3 kg)   LMP  (LMP Unknown)   BMI 36.05 kg/m    Physical Exam Vitals reviewed.  Neurological:     Mental Status: She is alert.      ASSESSMENT & PLAN:   Essential hypertension Assessment & Plan: Well controlled.  Continue clonidine  0.1 mg twice a day, diltiazem  120 mg daily, losartan  50 mg daily, lasix  20 mg twice daily.  Continue to work on eating a healthy diet and exercise.    Orders: -     AMB Referral [facility] Care Management  Abnormality of gait and mobility Assessment & Plan: Due to hip pain/chronic pain.  Also has fibromyalgia.  Orders: -     predniSONE ; Take 1 tablet (10 mg total) by mouth daily with breakfast.  Dispense:  30 tablet; Refill: 0 -     AMB Referral [facility] Care Management  Acute pain of both hips Assessment & Plan: Xrays Mild degenerative changes of both hips. No acute findings.  Start on prednisone  5 mg daily.   Orders: -     predniSONE ; Take 1 tablet (10 mg total) by mouth daily with breakfast.  Dispense: 30 tablet; Refill: 0 -     AMB Referral [facility] Care Management  Chronic heart failure with preserved ejection fraction [facility]) Assessment & Plan: Management per specialist.  Continue lasix  20 mg twice daily.   Orders: -     AMB Referral [facility] Care Management  Chronic pain syndrome Assessment & Plan: Poorly controlled.  Sees pain clinic.   Orders: -     AMB Referral [facility] Care Management -     Ambulatory referral to Rheumatology  Polyarthritis with positive rheumatoid factor (HCC) Assessment & Plan: Start on prednisone  10 mg daily.  Refer to Dr. Jeannetta.   Orders: -     predniSONE ; Take 1 tablet (10 mg total) by mouth daily with breakfast.  Dispense: 30 tablet; Refill: 0 -     AMB Referral [facility] Care Management -     Ambulatory referral to Rheumatology  Abnormal antinuclear antibody  titer Assessment & Plan: Refer to rheumatology  Orders: -     predniSONE ; Take 1 tablet (10 mg total) by mouth daily with breakfast.  Dispense: 30 tablet; Refill: 0 -     AMB Referral [facility] Care Management -     Ambulatory referral to Rheumatology  Encounter for osteoporosis screening in asymptomatic postmenopausal patient Assessment & Plan: Order bone density  Orders: -     DG Bone Density; Future     Orders Placed This Encounter  Procedures   DG Bone Density   AMB Referral [facility] Care Management   Ambulatory referral to Rheumatology     Meds ordered this encounter  Medications   predniSONE  (DELTASONE ) 10 MG tablet    Sig: Take 1 tablet (10 mg total) by mouth daily with breakfast.    Dispense:  30 tablet    Refill:  0       Follow Up:  In Person in 2 month(s)  Signed, Abigail Free, MD  12/05/2023 9:35 PM    [facility] [place]

## 2023-11-30 NOTE — Patient Instructions (Addendum)
 Refer to rheumatology.   Start on prednisone  10 mg daily x 30 days.   Continue Lyrica  75 mg three times a day.   Refer to care management with our pharmacist, Lang Sieve, Nursing, Olam Idol, RN, and social work, Tobias Moose.   I have called radiology about hip xrays. They are short handed, but are going to try to push it to the top. Not sure why they read the chext ray, but not hips yet.   I would like to order a bone density as I do not see that she has had one and I am pretty sure she has osteoporosis based on her exams/visits.

## 2023-12-01 ENCOUNTER — Ambulatory Visit (HOSPITAL_BASED_OUTPATIENT_CLINIC_OR_DEPARTMENT_OTHER): Admission: EM | Admit: 2023-12-01 | Discharge: 2023-12-01 | Disposition: A | Discharge status: Home / Self Care

## 2023-12-01 ENCOUNTER — Ambulatory Visit: Payer: Self-pay | Admitting: Family Medicine

## 2023-12-01 ENCOUNTER — Ambulatory Visit: Payer: Self-pay

## 2023-12-01 ENCOUNTER — Encounter (HOSPITAL_BASED_OUTPATIENT_CLINIC_OR_DEPARTMENT_OTHER): Payer: Self-pay | Admitting: Emergency Medicine

## 2023-12-01 DIAGNOSIS — R531 Weakness: Secondary | ICD-10-CM | POA: Diagnosis not present

## 2023-12-01 DIAGNOSIS — M7061 Trochanteric bursitis, right hip: Secondary | ICD-10-CM | POA: Diagnosis not present

## 2023-12-01 DIAGNOSIS — R519 Headache, unspecified: Secondary | ICD-10-CM

## 2023-12-01 DIAGNOSIS — M7062 Trochanteric bursitis, left hip: Secondary | ICD-10-CM | POA: Diagnosis not present

## 2023-12-01 DIAGNOSIS — R42 Dizziness and giddiness: Secondary | ICD-10-CM | POA: Diagnosis not present

## 2023-12-01 DIAGNOSIS — M7918 Myalgia, other site: Secondary | ICD-10-CM | POA: Diagnosis not present

## 2023-12-01 DIAGNOSIS — Z79899 Other long term (current) drug therapy: Secondary | ICD-10-CM | POA: Diagnosis not present

## 2023-12-01 DIAGNOSIS — G894 Chronic pain syndrome: Secondary | ICD-10-CM | POA: Diagnosis not present

## 2023-12-01 DIAGNOSIS — H532 Diplopia: Secondary | ICD-10-CM | POA: Diagnosis not present

## 2023-12-01 NOTE — Discharge Instructions (Signed)
 Weakness with dizziness headache and double vision: Patient had some trouble with proprioception testing.  She did not hit my finger consistently because she said she could see 2 fingers.  Otherwise her neurologic exam was normal.  She does not have any weakness on either side of the body besides generalized weakness.  She is walking with a walker and is generally steady on her feet.  Spoke with the patient and her husband.  Her vital signs are stable and she wants to go home and get something to eat and get some rest.  I advised that if she was concerned about the dizziness or the double vision or the headache that she needs to go to an emergency room where she can get further workup and possibly even a CT scan if needed.  She declined to do this.  She will go home.  She will call her doctor tomorrow.  And if needed she will go to an emergency room tomorrow in the mid day.  Follow-up here as needed.

## 2023-12-01 NOTE — Telephone Encounter (Signed)
 FYI Only or Action Required?: FYI only for provider.  Patient was last seen in primary care on 11/30/2023 by Sherre Clapper, MD.  Called Nurse Triage reporting Hypertension.  Symptoms began yesterday.  Interventions attempted: Nothing.  Symptoms are: gradually worsening.  Triage Disposition: Go to ED Now (Notify PCP)  Patient/caregiver understands and will follow disposition?: Yes          Copied from CRM [id number]. Topic: Referral - Question >> Dec 01, 2023  4:41 PM DeAngela L wrote: Reason for CRM: head ache and her blood pressure is up and she is seeing double vision and having a concern with her eyes  Pt num [phone] Reason for Disposition  [1] Systolic BP >= 160 OR Diastolic >= 100 AND [2] cardiac (e.g., breathing difficulty, chest pain) or neurologic symptoms (e.g., new-onset blurred or double vision, unsteady gait)  Answer Assessment - Initial Assessment Questions L eye wandering yesterday, blurred vision. Pt states she was heading home from appointment and  is turning around to head to hospital.    1. BLOOD PRESSURE: What is your blood pressure? Did you take at least two measurements 5 minutes apart?     Can't remember the reading.  2. ONSET: When did you take your blood pressure?     20 minutes ago at doctors appointment.   3. HISTORY: Do you have a history of high blood pressure?     Yes 4.. OTHER SYMPTOMS: Do you have any symptoms? (e.g., blurred vision, chest pain, difficulty breathing, headache, weakness)     Blurred vision, hurts over left eye  Protocols used: Blood Pressure - High-A-AH

## 2023-12-01 NOTE — ED Provider Notes (Addendum)
 [facility]    CSN: [id number] Arrival date & time: 12/01/23  1739      History   Chief Complaint Chief Complaint  Patient presents with   Hypertension    HPI Lauren Roth is a 78 y.o. female.   78 year old female with complex medical conditions who is seen by family practice, pain management, rheumatology, cardiology and other specialist.  She has been on the phone with cardiology, rheumatology, pain management and family practice in the last 24 to 48 hours.  She is currently wearing a patch cardiac monitor.  She is being worked up for a variety of medical conditions including rheumatologic conditions.  She had elevated blood pressure at the pain clinic earlier today and was advised to go to an emergency department for further evaluation of her blood pressure.  She is reporting some dizziness intermittently for a week or longer (since 11/22/2023 or earlier) with some double vision and headaches.  She feels like her symptoms are getting worse.  She has chronic pain she says from her head to her toes and is becoming overwhelmed by her chronic pain.  Her main concern today is double vision and dizziness.   Hypertension Associated symptoms include headaches. Pertinent negatives include no chest pain, no abdominal pain and no shortness of breath.    Past Medical History:  Diagnosis Date   Allergic rhinitis 01/16/2009   Qualifier: Diagnosis of  By: Kassie MD, Alyce DELENA Deal of this note might be different from the original. Overview:  Qualifier: Diagnosis of  By: Kassie MD, Alyce A   Arthritis    Chronic migraine w/o aura, not intractable, w/o stat migr    Chronic pain of right knee 06/12/2021   Chronic pain syndrome 07/30/2013   DEGENERATIVE DISC DISEASE, LUMBOSACRAL SPINE 01/16/2009   Qualifier: Diagnosis of  By: Kassie MD, Sean A    Depression    DIAPHORESIS 01/16/2009   Qualifier: Diagnosis of  By: Kassie MD, Sean A    Edema 01/16/2009   Qualifier:  Diagnosis of  By: Kassie MD, Alyce DELENA   Formatting of this note might be different from the original. Overview:  Qualifier: Diagnosis of  By: Kassie MD, Sean A   Essential hypertension 01/28/2017   Essential thrombocytosis (HCC) 11/25/2017   Fibromyalgia    GERD 01/16/2009   Qualifier: Diagnosis of  By: Kassie MD, Sean A    Heart failure (HCC) 05/08/2021   HYPERTENSION 01/16/2009   Qualifier: Diagnosis of  By: Kassie MD, Sean A    Hypokalemia 01/16/2009   Qualifier: Diagnosis of  By: Kassie MD, Alyce DELENA   Formatting of this note might be different from the original. Overview:  Qualifier: Diagnosis of  By: Kassie MD, Sean A   Left hip pain 05/24/2019   Lumbosacral spondylosis 10/09/2010   Lymphedema 02/10/2021   Memory loss 01/16/2009   Qualifier: Diagnosis of  By: Kassie MD, Alyce DELENA   Formatting of this note might be different from the original. Overview:  Qualifier: Diagnosis of  By: Kassie MD, Sean A   Menopausal and postmenopausal disorder 01/16/2009   Qualifier: Diagnosis of  By: Kassie MD, Alyce DELENA   Formatting of this note might be different from the original. Overview:  Qualifier: Diagnosis of  By: Kassie MD, Sean A   Moderate aortic regurgitation 05/21/2019   Neuropathic pain 09/25/2020   Neuropathy    Obesity (BMI 30.0-34.9) 10/13/2021   Pain in soft tissues of limb 06/20/2012  Formatting of this note might be different from the original. Overview:  IMPRESSION: Left knee pain Formatting of this note might be different from the original. IMPRESSION: Left knee pain   Pain in thoracic spine 07/30/2013   Palpitations 01/28/2017   Primary osteoarthritis of right knee 10/06/2014   Restless legs syndrome 08/25/2010   Shortness of breath on exertion 01/28/2017   SI (sacroiliac) pain 05/10/2016   Thoracic spondylosis 11/20/2010   Overview:  Overview:  IMPRESSION: Thoracic radiculopathy Overview:  IMPRESSION: Thoracic radiculopathy  Formatting of this note might be different from  the original. Overview:  IMPRESSION: Thoracic radiculopathy Formatting of this note might be different from the original. IMPRESSION: Thoracic radiculopathy   Thrombocythemia, essential (HCC) 09/12/2013   Thyroid  disease    TMJ (dislocation of temporomandibular joint) 07/30/2013   Tremor 12/29/2021   Unspecified hypothyroidism 01/16/2009   Qualifier: Diagnosis of  By: Kassie MD, Sean A    Uterine cancer (HCC) 07/30/2013    Patient Active Problem List   Diagnosis Date Noted   Abnormal antinuclear antibody titer 11/30/2023   Cough 11/21/2023   Wheezing 11/21/2023   Acute pain of both hips 11/21/2023   Pain in both hands 11/21/2023   Bradycardia 11/04/2023   Migraine with aura and with status migrainosus, not intractable 11/03/2023   Hypokalemia 10/11/2023   Compression fracture of L2 (HCC) 10/10/2023   Chronic diarrhea 09/01/2023   Chronic thumb pain, right 09/01/2023   Rib fracture 08/22/2023   Simple chronic bronchitis (HCC) 08/16/2023   Obesity, morbid (HCC) 08/16/2023   Candidal intertrigo 07/18/2023   Abnormality of gait and mobility 06/29/2023   Hyperkalemia 05/25/2023   Lumbar spondylosis 04/21/2023   Right hand pain 04/03/2023   Encounter for osteoporosis screening in asymptomatic postmenopausal patient 02/06/2023   Other chest pain 02/06/2023   Prediabetes 02/05/2023   Mixed hyperlipidemia 02/05/2023   Corns of multiple toes 01/11/2023   Toe pain, left 01/11/2023   Toe pain, right 01/11/2023   GAD (generalized anxiety disorder) 12/08/2022   SVT (supraventricular tachycardia) (HCC) 08/25/2022   Tremor 12/29/2021   Obesity (BMI 30.0-34.9) 10/13/2021   Chronic pain of right knee 06/12/2021   Chronic heart failure with preserved ejection fraction (HCC) 05/08/2021   Heart failure (HCC) 05/08/2021   Lymphedema 02/10/2021   Neuropathy 03/10/2020   Thyroid  disease    Left hip pain 05/24/2019   Moderate aortic regurgitation 05/21/2019   Pre-operative cardiovascular  examination 11/25/2017   Essential thrombocytosis (HCC) 11/25/2017   Palpitations 01/28/2017   Shortness of breath on exertion 01/28/2017   SI (sacroiliac) pain 05/10/2016   Primary osteoarthritis of right knee 10/06/2014   Closed fracture of left tibia 10/06/2014   History of total knee arthroplasty 10/06/2014   Thrombocythemia, essential (HCC) 09/12/2013   Uterine cancer (HCC) 07/30/2013   TMJ (dislocation of temporomandibular joint) 07/30/2013   Pain in thoracic spine 07/30/2013   Chronic pain syndrome 07/30/2013   Arthritis 07/30/2013   Disorder of sacrum 07/30/2013   Polyarthritis with positive rheumatoid factor (HCC) 07/30/2013   Cervical post-laminectomy syndrome 07/30/2013   Pain in soft tissues of limb 06/20/2012   Thoracic spondylosis 11/20/2010   Lumbosacral spondylosis 10/09/2010   Restless legs syndrome 08/25/2010   Medication management 08/25/2010   Fibromyalgia 08/25/2010   Hypothyroidism 01/16/2009   DEPRESSION 01/16/2009   Essential hypertension 01/16/2009   Allergic rhinitis 01/16/2009   GERD 01/16/2009   Menopausal and postmenopausal disorder 01/16/2009   DEGENERATIVE DISC DISEASE, LUMBOSACRAL SPINE 01/16/2009   DIAPHORESIS 01/16/2009  Memory loss 01/16/2009   Edema 01/16/2009   Diaphoresis 01/16/2009   Depression 01/16/2009    Past Surgical History:  Procedure Laterality Date   APPENDECTOMY     CERVICAL DISC SURGERY     CHOLECYSTECTOMY     FRACTURE SURGERY Left    Left arm has a metal plate   KNEE SURGERY Bilateral    TKR   ROBOTIC ASSISTED LAP VAGINAL HYSTERECTOMY     did not take ovaries. had uterine cancer.    OB History   No obstetric history on file.      Home Medications    Prior to Admission medications   Medication Sig Start Date End Date Taking? Authorizing Provider  oxycodone -acetaminophen  (LYNOX) 2.5-300 MG per tablet Take 1 tablet by mouth every 4 (four) hours as needed for pain.   Yes [provider]  albuterol   (VENTOLIN  HFA) 108 (90 Base) MCG/ACT inhaler Inhale 2 puffs into the lungs every 6 (six) hours as needed for wheezing or shortness of breath. 11/21/23   Teressa Harrie HERO, FNP  aspirin  81 MG chewable tablet Chew 81 mg by mouth daily.    [provider]  Biotin 10 MG TABS Take by mouth.    [provider]  buprenorphine  (SUBUTEX ) 2 MG SUBL SL tablet Place 2 mg under the tongue daily. 10/31/23   [provider]  buPROPion  (WELLBUTRIN  XL) 300 MG 24 hr tablet Take 1 tablet by mouth daily. 12/03/16   [provider]  Calcium Carb-Cholecalciferol 500-10 MG-MCG TABS Take by mouth.    [provider]  cloNIDine  (CATAPRES ) 0.1 MG tablet Take 1 tablet by mouth 2 (two) times daily. 12/15/16   [provider]  diclofenac Sodium (VOLTAREN) 1 % GEL Apply 0.5 g topically 4 (four) times daily. 05/20/21   [provider]  diltiazem  (CARDIZEM  CD) 120 MG 24 hr capsule Take 120 mg by mouth daily. 08/11/23   [provider]  DULoxetine  (CYMBALTA ) 60 MG capsule Take 60 mg by mouth daily. 05/19/19   [provider]  ergocalciferol (VITAMIN D2) 1.25 MG (50000 UT) capsule Take 50,000 Units by mouth once a week.    [provider]  finasteride  (PROSCAR ) 5 MG tablet Take 5 mg by mouth daily. 08/20/21   [provider]  fluticasone  (FLONASE ) 50 MCG/ACT nasal spray Place 1 spray into both nostrils daily as needed for allergies or rhinitis. 05/18/23   [name], MD  furosemide  (LASIX ) 20 MG tablet Take 1 tablet (20 mg total) by mouth 2 (two) times daily. 11/03/23   Teressa Harrie HERO, FNP  guaiFENesin -dextromethorphan (ROBITUSSIN DM) 100-10 MG/5ML syrup Take 5 mLs by mouth every 4 (four) hours as needed for cough. 11/21/23   Teressa Harrie HERO, FNP  iron polysaccharides (NIFEREX) 150 MG capsule Take 150 mg by mouth daily.    [provider]  levothyroxine  (SYNTHROID ) 50 MCG tablet Take 1 tablet by mouth once daily 09/16/23   Cox, Kirsten, MD   lidocaine (LIDODERM) 5 % Place onto the skin.    [provider]  loratadine (CLARITIN) 10 MG tablet Take 10 mg by mouth daily.    [provider]  losartan  (COZAAR ) 50 MG tablet Take 50 mg by mouth daily. 04/09/21   [provider]  Misc Natural Products (FIBER 7 PO) Take by mouth. 04/03/21   [provider]  montelukast (SINGULAIR) 10 MG tablet Take 10 mg by mouth at bedtime as needed (allergies).    [provider]  nitroGLYCERIN  (  NITROSTAT ) 0.4 MG SL tablet Place 1 tablet (0.4 mg total) under the tongue as needed for chest pain. 02/03/23   Cox, Abigail, MD  omeprazole  (PRILOSEC) 20 MG capsule Take 1 capsule (20 mg total) by mouth daily. 07/20/23   Sherre Abigail, MD  predniSONE  (DELTASONE ) 10 MG tablet Take 1 tablet (10 mg total) by mouth daily with breakfast. 11/30/23   Cox, Abigail, MD  pregabalin  (LYRICA ) 75 MG capsule Take 1 capsule (75 mg total) by mouth 3 (three) times daily. 11/21/23   Teressa Harrie HERO, FNP  psyllium (HYDROCIL/METAMUCIL) 95 % PACK Take 1 packet by mouth at bedtime.    [provider]  rOPINIRole  (REQUIP ) 1 MG tablet Take 1 tablet by mouth daily. 12/09/16   [provider]  thiamine (VITAMIN B1) 100 MG tablet Take by mouth. 04/03/21   [provider]  tiZANidine  (ZANAFLEX ) 4 MG tablet Take 4 mg by mouth at bedtime. 04/13/21   [provider]  topiramate  (TOPAMAX ) 25 MG tablet Take 1 tablet (25 mg total) by mouth 2 (two) times daily. 11/03/23   Teressa Harrie HERO, FNP  traZODone  (DESYREL ) 100 MG tablet Take 100 mg by mouth at bedtime. 02/11/20   [provider]    Family History Family History  Problem Relation Age of Onset   Deep vein thrombosis Father    Heart disease Father    Heart failure Father    Alzheimer's disease Father    Heart disease Brother    Pulmonary embolism Brother    Heart disease Brother     Social History Social History   Tobacco Use   Smoking status: Former    Smokeless tobacco: Never   Tobacco comments:    at age 42  Vaping Use   Vaping status: Never Used  Substance Use Topics   Alcohol use: No   Drug use: No     Allergies   Cottonseed oil and Codeine sulfate   Review of Systems Review of Systems  Constitutional:  Negative for chills and fever.  HENT:  Negative for ear pain and sore throat.   Eyes:  Positive for visual disturbance (Double vision). Negative for pain.  Respiratory:  Negative for cough and shortness of breath.   Cardiovascular:  Negative for chest pain and palpitations.  Gastrointestinal:  Negative for abdominal pain, constipation, diarrhea, nausea and vomiting.  Genitourinary:  Negative for dysuria and hematuria.  Musculoskeletal:  Positive for arthralgias (Chronic pain in multiple locations). Negative for back pain.  Skin:  Negative for color change and rash.  Neurological:  Positive for dizziness, weakness and headaches. Negative for seizures and syncope.  All other systems reviewed and are negative.    Physical Exam Triage Vital Signs ED Triage Vitals  Encounter Vitals Group     BP 12/01/23 1759 (!) 149/81     Girls Systolic BP Percentile --      Girls Diastolic BP Percentile --      Boys Systolic BP Percentile --      Boys Diastolic BP Percentile --      Pulse Rate 12/01/23 1759 93     Resp 12/01/23 1759 18     Temp 12/01/23 1759 98.2 F (36.8 C)     Temp Source 12/01/23 1759 Oral     SpO2 12/01/23 1759 94 %     Weight --      Height --      Head Circumference --      Peak Flow --  Pain Score 12/01/23 1802 10     Pain Loc --      Pain Education --      Exclude from Growth Chart --    No data found.  Updated Vital Signs BP (!) 144/76 (BP Location: Right Arm)   Pulse 90   Temp 98.2 F (36.8 C) (Oral)   Resp 18   LMP  (LMP Unknown)   SpO2 94%   Visual Acuity Right Eye Distance:   Left Eye Distance:   Bilateral Distance:    Right Eye Near:   Left Eye Near:    Bilateral Near:      Physical Exam Vitals and nursing note reviewed.  Constitutional:      General: She is not in acute distress.    Appearance: She is well-developed. She is ill-appearing. She is not toxic-appearing or diaphoretic.  HENT:     Head: Normocephalic and atraumatic.     Right Ear: Hearing, tympanic membrane, ear canal and external ear normal.     Left Ear: Hearing, tympanic membrane, ear canal and external ear normal.     Nose: No congestion or rhinorrhea.     Right Sinus: No maxillary sinus tenderness or frontal sinus tenderness.     Left Sinus: No maxillary sinus tenderness or frontal sinus tenderness.     Mouth/Throat:     Lips: Pink.     Mouth: Mucous membranes are moist.     Pharynx: Uvula midline. No oropharyngeal exudate or posterior oropharyngeal erythema.     Tonsils: No tonsillar exudate.  Eyes:     Conjunctiva/sclera: Conjunctivae normal.     Pupils: Pupils are equal, round, and reactive to light.  Cardiovascular:     Rate and Rhythm: Normal rate and regular rhythm.     Heart sounds: S1 normal and S2 normal. No murmur heard. Pulmonary:     Effort: Pulmonary effort is normal. No respiratory distress.     Breath sounds: Normal breath sounds. No decreased breath sounds, wheezing, rhonchi or rales.  Abdominal:     General: Bowel sounds are normal.     Palpations: Abdomen is soft.     Tenderness: There is no abdominal tenderness.  Musculoskeletal:        General: No swelling.     Cervical back: Neck supple.  Lymphadenopathy:     Head:     Right side of head: No submental, submandibular, tonsillar, preauricular or posterior auricular adenopathy.     Left side of head: No submental, submandibular, tonsillar, preauricular or posterior auricular adenopathy.     Cervical: No cervical adenopathy.     Right cervical: No superficial cervical adenopathy.    Left cervical: No superficial cervical adenopathy.  Skin:    General: Skin is warm and dry.     Capillary Refill: Capillary  refill takes less than 2 seconds.     Findings: No rash.  Neurological:     Mental Status: She is alert and oriented to person, place, and time.     Cranial Nerves: Cranial nerves 2-12 are intact.     Sensory: Sensation is intact.     Motor: Tremor present.     Coordination: Finger-Nose-Finger Test abnormal.     Gait: Gait abnormal (Unsteady on her feet and uses a rolling walker but is walking well today.).  Psychiatric:        Mood and Affect: Mood normal.      UC Treatments / Results  Labs (all labs ordered are listed, but only  abnormal results are displayed) Labs Reviewed - No data to display  EKG   Radiology No results found.  Procedures Procedures (including critical care time)  Medications Ordered in UC Medications - No data to display  Initial Impression / Assessment and Plan / UC Course  I have reviewed the triage vital signs and the nursing notes.  Pertinent labs & imaging results that were available during my care of the patient were reviewed by me and considered in my medical decision making (see chart for details).  Plan of Care: Dizziness with headache and double vision: Exam is normal today except she is having some trouble with finger-to-nose touching.  She reports that she is seeing double and having trouble touching my finger because she sees 2 fingers in front of her.  She also has very faint intentional tremor with trying to do the finger-nose movements.  Otherwise her exam was normal and no sign of neurologic deficit.  No weakness on either side of the body.  She uses a walker because she often feels unsteady but she is walking well today.  Vital signs were good/stable.  If patient wants further workup for double vision, advised she needs to go to an emergency room where she could get additional testing including a possible CT scan.  For now the patient wants to go home and will consider that for tomorrow.  Her husband is with her and he will take her  home.  Follow-up here as needed.  I reviewed the plan of care with the patient and/or the patient's guardian.  The patient and/or guardian had time to ask questions and acknowledged that the questions were answered.  I provided instruction on symptoms or reasons to return here or to go to an ER, if symptoms/condition did not improve, worsened or if new symptoms occurred.   I spent 30 minutes on patient care, including face to face time and care planning/patient management.  Additional time was needed to take the history, talk with the patient, discussed treatment options and her exam. Final Clinical Impressions(s) / UC Diagnoses   Final diagnoses:  Weakness  Double vision with both eyes open  Nonintractable headache, unspecified chronicity pattern, unspecified headache type  Dizziness     Discharge Instructions      Weakness with dizziness headache and double vision: Patient had some trouble with proprioception testing.  She did not hit my finger consistently because she said she could see 2 fingers.  Otherwise her neurologic exam was normal.  She does not have any weakness on either side of the body besides generalized weakness.  She is walking with a walker and is generally steady on her feet.  Spoke with the patient and her husband.  Her vital signs are stable and she wants to go home and get something to eat and get some rest.  I advised that if she was concerned about the dizziness or the double vision or the headache that she needs to go to an emergency room where she can get further workup and possibly even a CT scan if needed.  She declined to do this.  She will go home.  She will call her doctor tomorrow.  And if needed she will go to an emergency room tomorrow in the mid day.  Follow-up here as needed.     ED Prescriptions   None    PDMP not reviewed this encounter.   Ival Domino, FNP 12/01/23 1909    Ival Domino, FNP 12/01/23 1910

## 2023-12-01 NOTE — ED Triage Notes (Signed)
 Pt to ED c/o being hypertensive at the pain clinic earlier today.  Pt also c/o dizziness off and on for several weeks and headache  Today st's symptoms are worse.  Pt also st's she has pain from head to toes x's 1 month

## 2023-12-02 DIAGNOSIS — Z973 Presence of spectacles and contact lenses: Secondary | ICD-10-CM | POA: Diagnosis not present

## 2023-12-02 DIAGNOSIS — Z79899 Other long term (current) drug therapy: Secondary | ICD-10-CM | POA: Diagnosis not present

## 2023-12-02 DIAGNOSIS — H538 Other visual disturbances: Secondary | ICD-10-CM | POA: Diagnosis not present

## 2023-12-02 DIAGNOSIS — R519 Headache, unspecified: Secondary | ICD-10-CM | POA: Diagnosis not present

## 2023-12-02 DIAGNOSIS — Z87828 Personal history of other (healed) physical injury and trauma: Secondary | ICD-10-CM | POA: Diagnosis not present

## 2023-12-02 DIAGNOSIS — H532 Diplopia: Secondary | ICD-10-CM | POA: Diagnosis not present

## 2023-12-02 DIAGNOSIS — Z8669 Personal history of other diseases of the nervous system and sense organs: Secondary | ICD-10-CM | POA: Diagnosis not present

## 2023-12-03 DIAGNOSIS — R519 Headache, unspecified: Secondary | ICD-10-CM | POA: Diagnosis not present

## 2023-12-05 ENCOUNTER — Encounter: Payer: Self-pay | Admitting: Family Medicine

## 2023-12-05 ENCOUNTER — Telehealth: Payer: Self-pay

## 2023-12-05 DIAGNOSIS — I2109 ST elevation (STEMI) myocardial infarction involving other coronary artery of anterior wall: Secondary | ICD-10-CM | POA: Diagnosis not present

## 2023-12-05 DIAGNOSIS — I2510 Atherosclerotic heart disease of native coronary artery without angina pectoris: Secondary | ICD-10-CM | POA: Diagnosis not present

## 2023-12-05 NOTE — Assessment & Plan Note (Signed)
 Poorly controlled.  Sees pain clinic.

## 2023-12-05 NOTE — Assessment & Plan Note (Signed)
 Due to hip pain/chronic pain.  Also has fibromyalgia.

## 2023-12-05 NOTE — Assessment & Plan Note (Signed)
 Well controlled.  Continue clonidine  0.1 mg twice a day, diltiazem  120 mg daily, losartan  50 mg daily, lasix  20 mg twice daily.  Continue to work on eating a healthy diet and exercise.

## 2023-12-05 NOTE — Assessment & Plan Note (Signed)
 Start on prednisone  10 mg daily.  Refer to Dr. Jeannetta.

## 2023-12-05 NOTE — Assessment & Plan Note (Signed)
Refer to rheumatology  

## 2023-12-05 NOTE — Assessment & Plan Note (Signed)
 Xrays Mild degenerative changes of both hips. No acute findings.  Start on prednisone  5 mg daily.

## 2023-12-05 NOTE — Progress Notes (Signed)
 Complex Care Management Note  Care Guide Note 12/05/2023 Name: Lauren Roth MRN: [id number] DOB: [birth year]  Lauren Roth is a 78 y.o. year old female who sees Cox, Abigail, MD for primary care. I reached out to [employer] by phone today to offer complex care management services.  [name] was given information about Complex Care Management services today including:   The Complex Care Management services include support from the care team which includes your Nurse Care Manager, Clinical Social Worker, or Pharmacist.  The Complex Care Management team is here to help remove barriers to the health concerns and goals most important to you. Complex Care Management services are voluntary, and the patient may decline or stop services at any time by request to their care team member.   Complex Care Management Consent Status: Patient agreed to services and verbal consent obtained.   Follow up plan:  Telephone appointment with complex care management team member scheduled for:  12/07/23, 12/15/23 & 12/16/23.   Encounter Outcome:  Patient Scheduled  Dreama [facility]  [facility], [facility] VBCI Assistant Direct Dial: [phone]  Fax: [phone]

## 2023-12-05 NOTE — Assessment & Plan Note (Signed)
Order bone density.  ° °

## 2023-12-05 NOTE — Assessment & Plan Note (Signed)
 Management per specialist.  Continue lasix  20 mg twice daily.

## 2023-12-07 ENCOUNTER — Telehealth: Payer: Self-pay

## 2023-12-07 ENCOUNTER — Other Ambulatory Visit

## 2023-12-07 ENCOUNTER — Ambulatory Visit: Payer: Self-pay

## 2023-12-07 DIAGNOSIS — I509 Heart failure, unspecified: Secondary | ICD-10-CM | POA: Diagnosis not present

## 2023-12-07 DIAGNOSIS — R079 Chest pain, unspecified: Secondary | ICD-10-CM | POA: Diagnosis not present

## 2023-12-07 DIAGNOSIS — R1084 Generalized abdominal pain: Secondary | ICD-10-CM | POA: Diagnosis not present

## 2023-12-07 DIAGNOSIS — R531 Weakness: Secondary | ICD-10-CM | POA: Diagnosis not present

## 2023-12-07 DIAGNOSIS — K8689 Other specified diseases of pancreas: Secondary | ICD-10-CM | POA: Diagnosis not present

## 2023-12-07 DIAGNOSIS — R634 Abnormal weight loss: Secondary | ICD-10-CM | POA: Diagnosis not present

## 2023-12-07 DIAGNOSIS — I700 Atherosclerosis of aorta: Secondary | ICD-10-CM | POA: Diagnosis not present

## 2023-12-07 DIAGNOSIS — R109 Unspecified abdominal pain: Secondary | ICD-10-CM | POA: Diagnosis not present

## 2023-12-07 DIAGNOSIS — R0602 Shortness of breath: Secondary | ICD-10-CM | POA: Diagnosis not present

## 2023-12-07 DIAGNOSIS — M791 Myalgia, unspecified site: Secondary | ICD-10-CM | POA: Diagnosis not present

## 2023-12-07 DIAGNOSIS — R519 Headache, unspecified: Secondary | ICD-10-CM | POA: Diagnosis not present

## 2023-12-07 DIAGNOSIS — I110 Hypertensive heart disease with heart failure: Secondary | ICD-10-CM | POA: Diagnosis not present

## 2023-12-07 NOTE — Telephone Encounter (Signed)
 Agree. Dr. Sedalia Muta

## 2023-12-07 NOTE — Telephone Encounter (Signed)
 FYI Only or Action Required?: FYI only for provider.  Patient was last seen in primary care on 11/30/2023 by Sherre Clapper, MD.  Called Nurse Triage reporting Fatigue.  Symptoms began several days ago.  Interventions attempted: Rest, hydration, or home remedies, Dietary changes, and Other: Recent ED Visit on 12-02-2023 .  Symptoms are: rapidly worsening.  Triage Disposition: Go to ED Now (or PCP Triage)  Patient/caregiver understands and will follow disposition?: Yes    Copied from CRM [id number]. Topic: Clinical - Red Word Triage >> Dec 07, 2023  8:18 AM Donna BRAVO wrote: Red Word that prompted transfer to Nurse Triage: patient is in more pain all over, weakness all over, losing weight like crazy, was in ED 12/02/23 for double vision and headache, double vision is getting worse, hard to lift fork and spoon to eat Reason for Disposition . [1] Drinking very little AND [2] dehydration suspected (e.g., no urine > 12 hours, very dry mouth, very lightheaded)  Answer Assessment - Initial Assessment Questions 1. DESCRIPTION: Describe how you are feeling.     Weakness, Severe  2. SEVERITY: How bad is it?  Can you stand and walk?     Can Barely Lift a Finger  3. ONSET: When did these symptoms begin? (e.g., hours, days, weeks, months)     Several Days Ago  4. CAUSE: What do you think is causing the weakness or fatigue? (e.g., not drinking enough fluids, medical problem, trouble sleeping)     Unsure  5. NEW MEDICINES:  Have you started on any new medicines recently? (e.g., opioid pain medicines, benzodiazepines, muscle relaxants, antidepressants, antihistamines, neuroleptics, beta blockers)     No new medications  6. OTHER SYMPTOMS: Do you have any other symptoms? (e.g., chest pain, fever, cough, SOB, vomiting, diarrhea, bleeding, other areas of pain)     Unintentional Weight Loss, Double Vision, Decreased Urination, Nausea, Joint Aching  7. PREGNANCY: Is there any chance you  are pregnant? When was your last menstrual period?     No and No  Patient has a history of CHF, Rheumatoid Arthritis  Protocols used: Weakness (Generalized) and Fatigue-A-AH

## 2023-12-07 NOTE — Progress Notes (Signed)
   12/07/2023 Name: Lauren Roth MRN: [id number] DOB: [birth year]  Chief Complaint  Patient presents with   Medication Management    Spoke with patient d/t scheduled medication review. Was riaged this AM and advised to get to ED, understands Dr Sherre was agreeable to recommendation to go to ED. On her way to ED now.   Will coordinate with care guide to RS at later date.   Lang Sieve, PharmD, BCGP Clinical Pharmacist  [phone]

## 2023-12-08 ENCOUNTER — Ambulatory Visit

## 2023-12-08 DIAGNOSIS — R531 Weakness: Secondary | ICD-10-CM | POA: Diagnosis not present

## 2023-12-09 ENCOUNTER — Telehealth: Payer: Self-pay

## 2023-12-09 ENCOUNTER — Ambulatory Visit (INDEPENDENT_AMBULATORY_CARE_PROVIDER_SITE_OTHER)

## 2023-12-09 VITALS — BP 118/66 | HR 65 | Temp 98.2°F | Ht 64.0 in | Wt 198.0 lb

## 2023-12-09 DIAGNOSIS — H532 Diplopia: Secondary | ICD-10-CM

## 2023-12-09 DIAGNOSIS — R531 Weakness: Secondary | ICD-10-CM

## 2023-12-09 DIAGNOSIS — G894 Chronic pain syndrome: Secondary | ICD-10-CM

## 2023-12-09 DIAGNOSIS — Z862 Personal history of diseases of the blood and blood-forming organs and certain disorders involving the immune mechanism: Secondary | ICD-10-CM | POA: Diagnosis not present

## 2023-12-09 DIAGNOSIS — M0580 Other rheumatoid arthritis with rheumatoid factor of unspecified site: Secondary | ICD-10-CM | POA: Diagnosis not present

## 2023-12-09 DIAGNOSIS — D473 Essential (hemorrhagic) thrombocythemia: Secondary | ICD-10-CM

## 2023-12-09 NOTE — Assessment & Plan Note (Signed)
 Awaiting rheumatology evaluation. She is placed on low-dose prednisone  but patient does not seem to have any relief.  Will monitor

## 2023-12-09 NOTE — Progress Notes (Signed)
 Complex Care Management Note Care Guide Note  12/09/2023 Name: Lauren Roth MRN: [id number] DOB: [birth year]   Complex Care Management Outreach Attempts: An unsuccessful telephone outreach was attempted today to offer the patient information about available complex care management services.  Follow Up Plan:  Additional outreach attempts will be made to offer the patient complex care management information and services.   Encounter Outcome:  No Answer  Dreama [facility]  [facility], [facility] VBCI Assistant Direct Dial: [phone]  Fax: [phone]

## 2023-12-09 NOTE — Patient Instructions (Signed)
  VISIT SUMMARY: Today, we addressed your worsening pain, double vision, and other related symptoms. We discussed your chronic pain management and the new onset of double vision, and we have planned further evaluations and follow-ups.  YOUR PLAN: DIPLOPIA (DOUBLE VISION): You have recently started experiencing double vision, which is causing you significant distress. -We have performed an MRI of your brain and are waiting for the results. -We will place an urgent referral to a neurologist at [facility] for further evaluation. -Please follow up on the scheduling of your neurology appointment.  CHRONIC PAIN SYNDROME WITH LUMBAR COMPRESSION FRACTURE AND LUMBAR SPONDYLOSIS: You have chronic lower back and buttock pain, which is worse on the right side and persistent despite multiple medications. A CT scan from June shows a slight compression fracture at L2 and lumbar arthritis. -Continue your current pain management regimen, including Subutex , diclofenac, Voltaren gel, Cymbalta , lidocaine patch, Percocet, prednisone , Lyrica , and Xanax. -Follow up with the pharmacist to review and adjust your medications as needed. -We will expedite your rheumatology appointment for further treatment options.                      Contains text generated by Abridge.                                 Contains text generated by Abridge.

## 2023-12-09 NOTE — Assessment & Plan Note (Signed)
 Chronic lower back and buttock pain and pain OVERALL, chronic, worse on the right side, persistent despite multiple medications. CT scan from June shows slight compression fracture at L2 and lumbar arthritis. Current pain management includes multiple medications with no significant relief. Physical therapy and pain clinic consulted.  Patient is very frustrated that she is in a significant amount of pain without answers, all her pain medications are not helping her enough and that she is losing weight inappropriately even though her husband makes her calorie rich meals.  While it is reassuring that she had all normal blood work recently in the hospital and before and she had normal imaging including MRI of her brain and CT scans of her chest, abdomen and pelvis, the etiology of her symptoms is unclear which is indeed frustrating.  PLAN:  unfortunately I do not have any other recommendations for blood tests today.  I strongly feel her polypharmacy should be addressed.  She has missed an appointment with the pharmacist to discuss this as she was at the emergency room recently.  Advised to make an appointment again.  Until then, - Continue current pain management regimen: Subutex , diclofenac, Voltaren gel, Cymbalta , lidocaine patch, Percocet, prednisone , Lyrica , and Xanax. - Follow up with pharmacist to review and adjust medications. - Expedite rheumatology appointment for further treatment options.  She had positive ANA and rheumatoid factor but negative confirmatory testing.  She definitely will benefit from seeing the rheumatologist, but her current appointment is not until January.  Will try to expedite this.

## 2023-12-09 NOTE — Progress Notes (Addendum)
 Subjective:  Patient ID: Lauren Roth, female    DOB: Dec 17, 1945  Age: 78 y.o. MRN: [id number]  Chief Complaint  Patient presents with   Hospitalization Follow-up    HPI: Discussed the use of AI scribe software for clinical note transcription with the patient, who gave verbal consent to proceed.   Discussed the use of AI scribe software for clinical note transcription with the patient, who gave verbal consent to proceed.  History of Present Illness   Lauren Roth is a 78 year old female who presents with worsening pain and double vision. She has gone to the emergency room at [facility] on August 20 for worsening generalized weakness and worsening pain.  She is accompanied  by her husband who presents with her primary caregiver  Musculoskeletal pain and stiffness - Chronic pain primarily in the back and hips, more pronounced on the right side - Pain is most severe in the mornings and accompanied by joint stiffness - Current pain management regimen includes Subutex , diclofenac, Voltaren gel, Cymbalta  60 mg twice daily, lidocaine patch, Percocet 10 mg four times daily, prednisone  10 mg, Lyrica  75 mg twice daily, and Xanax 4 mg nightly - History of slight compression fracture at the L2 level and arthritis at other lumbar levels based on June CT scan - No recent participation in physical therapy, though it was previously available - Difficulty with daily activities such as cooking and cleaning due to pain and fatigue  Diplopia - New onset double vision, states that her symptoms have been present for about 6 weeks now. - No other vision problems aside from double vision - MRI of the brain performed; was told everything was normal - No neurology consultation to date   Resolved left toe ulcer - History of ulcer on left toe, now resolved  High blood pressure:          12/09/2023   10:09 AM 07/18/2023    2:37 PM 03/21/2023    2:29 PM 02/03/2023    1:33 PM   Depression screen PHQ 2/9  Decreased Interest 0 0 0 0  Down, Depressed, Hopeless 0 0 0 0  PHQ - 2 Score 0 0 0 0  Altered sleeping 0  0   Tired, decreased energy 0  0   Change in appetite 0  0   Feeling bad or failure about yourself  0  0   Trouble concentrating 0  0   Moving slowly or fidgety/restless 0  0   Suicidal thoughts 0  0   PHQ-9 Score 0  0   Difficult doing work/chores Not difficult at all  Not difficult at all         12/09/2023   10:09 AM  Fall Risk   Falls in the past year? 1  Number falls in past yr: 0  Injury with Fall? 1  Risk for fall due to : History of fall(s)  Follow up Falls evaluation completed    Patient Care Team: Sherre Clapper, MD as PCP - General (Family Medicine) Launie, Alexa Shad, MD as Referring Physician (Cardiology) Myrick Elspeth PARAS., DPM (Podiatry) Silva Elveria BIRCH (Inactive) as Referring Physician Powell Slater RIGGERS as Physician Assistant (Physician Assistant)   Review of Systems  Constitutional:  Negative for chills, fatigue and fever.  HENT:  Negative for congestion, ear pain, sinus pressure and sore throat.   Respiratory:  Negative for cough and shortness of breath.   Cardiovascular:  Negative for chest pain.  Gastrointestinal:  Positive for constipation. Negative for abdominal pain, diarrhea, nausea and vomiting.  Genitourinary:  Negative for dysuria and frequency.  Musculoskeletal:  Positive for arthralgias, back pain and gait problem. Negative for myalgias.  Neurological:  Negative for dizziness and headaches.  Psychiatric/Behavioral:  Positive for sleep disturbance. Negative for dysphoric mood. The patient is not nervous/anxious.     Current Outpatient Medications on File Prior to Visit  Medication Sig Dispense Refill   albuterol  (VENTOLIN  HFA) 108 (90 Base) MCG/ACT inhaler Inhale 2 puffs into the lungs every 6 (six) hours as needed for wheezing or shortness of breath. 8 g 2   aspirin  81 MG chewable tablet Chew 81 mg by  mouth daily.     Biotin 10 MG TABS Take by mouth.     buprenorphine  (SUBUTEX ) 2 MG SUBL SL tablet Place 2 mg under the tongue daily.     buPROPion  (WELLBUTRIN  XL) 300 MG 24 hr tablet Take 1 tablet by mouth daily.     Calcium Carb-Cholecalciferol 500-10 MG-MCG TABS Take by mouth.     cloNIDine  (CATAPRES ) 0.1 MG tablet Take 1 tablet by mouth 2 (two) times daily.     diclofenac Sodium (VOLTAREN) 1 % GEL Apply 0.5 g topically 4 (four) times daily.     diltiazem  (CARDIZEM  CD) 120 MG 24 hr capsule Take 120 mg by mouth daily.     DULoxetine  (CYMBALTA ) 60 MG capsule Take 60 mg by mouth daily.     ergocalciferol (VITAMIN D2) 1.25 MG (50000 UT) capsule Take 50,000 Units by mouth once a week.     finasteride  (PROSCAR ) 5 MG tablet Take 5 mg by mouth daily.     fluticasone  (FLONASE ) 50 MCG/ACT nasal spray Place 1 spray into both nostrils daily as needed for allergies or rhinitis. 15.8 mL 1   furosemide  (LASIX ) 20 MG tablet Take 1 tablet (20 mg total) by mouth 2 (two) times daily. 180 tablet 0   iron polysaccharides (NIFEREX) 150 MG capsule Take 150 mg by mouth daily.     levothyroxine  (SYNTHROID ) 50 MCG tablet Take 1 tablet by mouth once daily 90 tablet 0   lidocaine (LIDODERM) 5 % Place onto the skin.     loratadine (CLARITIN) 10 MG tablet Take 10 mg by mouth daily.     losartan  (COZAAR ) 50 MG tablet Take 50 mg by mouth daily.     Misc Natural Products (FIBER 7 PO) Take by mouth.     montelukast (SINGULAIR) 10 MG tablet Take 10 mg by mouth at bedtime as needed (allergies).     mupirocin ointment (BACTROBAN) 2 % 1 Application 2 (two) times daily.     nitroGLYCERIN  (NITROSTAT ) 0.4 MG SL tablet Place 1 tablet (0.4 mg total) under the tongue as needed for chest pain. 25 tablet 3   omeprazole  (PRILOSEC) 20 MG capsule Take 1 capsule (20 mg total) by mouth daily. 90 capsule 1   oxybutynin (DITROPAN-XL) 10 MG 24 hr tablet Take 10 mg by mouth at bedtime.     oxycodone -acetaminophen  (LYNOX) 2.5-300 MG per tablet  Take 1 tablet by mouth every 4 (four) hours as needed for pain.     predniSONE  (DELTASONE ) 10 MG tablet Take 1 tablet (10 mg total) by mouth daily with breakfast. 30 tablet 0   pregabalin  (LYRICA ) 75 MG capsule Take 1 capsule (75 mg total) by mouth 3 (three) times daily. 90 capsule 2   propranolol ER (INDERAL LA) 60 MG 24 hr capsule Take 60 mg by mouth daily.  rOPINIRole  (REQUIP ) 1 MG tablet Take 1 tablet by mouth daily.     thiamine (VITAMIN B1) 100 MG tablet Take by mouth.     tiZANidine  (ZANAFLEX ) 4 MG tablet Take 4 mg by mouth at bedtime.     topiramate  (TOPAMAX ) 25 MG tablet Take 1 tablet (25 mg total) by mouth 2 (two) times daily. 180 tablet 0   traZODone  (DESYREL ) 100 MG tablet Take 100 mg by mouth at bedtime.     No current facility-administered medications on file prior to visit.   Past Medical History:  Diagnosis Date   Allergic rhinitis 01/16/2009   Qualifier: Diagnosis of  By: Kassie MD, Alyce DELENA Deal of this note might be different from the original. Overview:  Qualifier: Diagnosis of  By: Kassie MD, Alyce A   Arthritis    Chronic migraine w/o aura, not intractable, w/o stat migr    Chronic pain of right knee 06/12/2021   Chronic pain syndrome 07/30/2013   DEGENERATIVE DISC DISEASE, LUMBOSACRAL SPINE 01/16/2009   Qualifier: Diagnosis of  By: Kassie MD, Sean A    Depression    DIAPHORESIS 01/16/2009   Qualifier: Diagnosis of  By: Kassie MD, Sean A    Edema 01/16/2009   Qualifier: Diagnosis of  By: Kassie MD, Alyce DELENA   Formatting of this note might be different from the original. Overview:  Qualifier: Diagnosis of  By: Kassie MD, Sean A   Essential hypertension 01/28/2017   Essential thrombocytosis (HCC) 11/25/2017   Fibromyalgia    GERD 01/16/2009   Qualifier: Diagnosis of  By: Kassie MD, Sean A    Heart failure (HCC) 05/08/2021   HYPERTENSION 01/16/2009   Qualifier: Diagnosis of  By: Kassie MD, Sean A    Hypokalemia 01/16/2009   Qualifier: Diagnosis of   By: Kassie MD, Alyce DELENA   Formatting of this note might be different from the original. Overview:  Qualifier: Diagnosis of  By: Kassie MD, Sean A   Left hip pain 05/24/2019   Lumbosacral spondylosis 10/09/2010   Lymphedema 02/10/2021   Memory loss 01/16/2009   Qualifier: Diagnosis of  By: Kassie MD, Alyce DELENA   Formatting of this note might be different from the original. Overview:  Qualifier: Diagnosis of  By: Kassie MD, Sean A   Menopausal and postmenopausal disorder 01/16/2009   Qualifier: Diagnosis of  By: Kassie MD, Alyce DELENA   Formatting of this note might be different from the original. Overview:  Qualifier: Diagnosis of  By: Kassie MD, Sean A   Moderate aortic regurgitation 05/21/2019   Neuropathic pain 09/25/2020   Neuropathy    Obesity (BMI 30.0-34.9) 10/13/2021   Pain in soft tissues of limb 06/20/2012   Formatting of this note might be different from the original. Overview:  IMPRESSION: Left knee pain Formatting of this note might be different from the original. IMPRESSION: Left knee pain   Pain in thoracic spine 07/30/2013   Palpitations 01/28/2017   Primary osteoarthritis of right knee 10/06/2014   Restless legs syndrome 08/25/2010   Shortness of breath on exertion 01/28/2017   SI (sacroiliac) pain 05/10/2016   Thoracic spondylosis 11/20/2010   Overview:  Overview:  IMPRESSION: Thoracic radiculopathy Overview:  IMPRESSION: Thoracic radiculopathy  Formatting of this note might be different from the original. Overview:  IMPRESSION: Thoracic radiculopathy Formatting of this note might be different from the original. IMPRESSION: Thoracic radiculopathy   Thrombocythemia, essential (HCC) 09/12/2013   Thyroid  disease    TMJ (dislocation of temporomandibular joint)  07/30/2013   Tremor 12/29/2021   Unspecified hypothyroidism 01/16/2009   Qualifier: Diagnosis of  By: Kassie MD, Sean A    Uterine cancer [facility]) 07/30/2013   Past Surgical History:  Procedure Laterality Date    APPENDECTOMY     CERVICAL DISC SURGERY     CHOLECYSTECTOMY     FRACTURE SURGERY Left    Left arm has a metal plate   KNEE SURGERY Bilateral    TKR   ROBOTIC ASSISTED LAP VAGINAL HYSTERECTOMY     did not take ovaries. had uterine cancer.    Family History  Problem Relation Age of Onset   Deep vein thrombosis Father    Heart disease Father    Heart failure Father    Alzheimer's disease Father    Heart disease Brother    Pulmonary embolism Brother    Heart disease Brother    Social History   Socioeconomic History   Marital status: Married    Spouse name: Not on file   Number of children: Not on file   Years of education: Not on file   Highest education level: GED or equivalent  Occupational History   Not on file  Tobacco Use   Smoking status: Former   Smokeless tobacco: Never   Tobacco comments:    at age 74  Vaping Use   Vaping status: Never Used  Substance and Sexual Activity   Alcohol use: No   Drug use: No   Sexual activity: Not on file  Other Topics Concern   Not on file  Social History Narrative   Not on file   Social Drivers of Health   Financial Resource Strain: Low Risk  (03/21/2023)   Overall Financial Resource Strain (CARDIA)    Difficulty of Paying Living Expenses: Not hard at all  Food Insecurity: No Food Insecurity (10/11/2023)   Hunger Vital Sign    Worried About Running Out of Food in the Last Year: Never true    Ran Out of Food in the Last Year: Never true  Transportation Needs: No Transportation Needs (10/11/2023)   PRAPARE - Administrator, Civil Service (Medical): No    Lack of Transportation (Non-Medical): No  Physical Activity: Inactive (03/21/2023)   Exercise Vital Sign    Days of Exercise per Week: 0 days    Minutes of Exercise per Session: 0 min  Stress: No Stress Concern Present (03/21/2023)   [employer] of Occupational Health - Occupational Stress Questionnaire    Feeling of Stress : Not at all  Social Connections:  Socially Integrated (10/11/2023)   Social Connection and Isolation Panel    Frequency of Communication with Friends and Family: More than three times a week    Frequency of Social Gatherings with Friends and Family: Twice a week    Attends Religious Services: More than 4 times per year    Active Member of [employer] or Organizations: Yes    Attends Engineer, structural: More than 4 times per year    Marital Status: Married    Objective:  BP 118/66   Pulse 65   Temp 98.2 F (36.8 C)   Ht 5' 4 (1.626 m)   Wt 198 lb (89.8 kg)   LMP  (LMP Unknown)   SpO2 97%   BMI 33.99 kg/m      12/09/2023    9:58 AM 12/01/2023    6:50 PM 12/01/2023    5:59 PM  BP/Weight  Systolic BP 118 144 149  Diastolic BP 66 76 81  Wt. (Lbs) 198    BMI 33.99 kg/m2      Physical Exam Vitals and nursing note reviewed.  Constitutional:      Appearance: She is obese.  HENT:     Head: Normocephalic and atraumatic.     Mouth/Throat:     Comments: edentulous Cardiovascular:     Rate and Rhythm: Normal rate and regular rhythm.     Heart sounds: Murmur heard.  Pulmonary:     Effort: Pulmonary effort is normal.     Breath sounds: Normal breath sounds.  Musculoskeletal:        General: Swelling (trace pedal edema) and tenderness (gluteal tenderness bilaterally) present.  Skin:    General: Skin is warm.  Neurological:     General: No focal deficit present.     Mental Status: She is alert.  Psychiatric:        Mood and Affect: Mood normal.         Lab Results  Component Value Date   WBC 7.3 11/02/2023   HGB 13.4 11/02/2023   HCT 41.2 11/02/2023   PLT 369 11/02/2023   GLUCOSE 124 (H) 11/02/2023   CHOL 209 (H) 02/03/2023   TRIG 155 (H) 02/03/2023   HDL 44 02/03/2023   LDLCALC 137 (H) 02/03/2023   ALT 15 11/02/2023   AST 17 11/02/2023   NA 140 11/02/2023   K 4.4 11/02/2023   CL 99 11/02/2023   CREATININE 0.89 11/02/2023   BUN 13 11/02/2023   CO2 26 11/02/2023   TSH 1.210 11/02/2023    HGBA1C 5.5 02/03/2023      Assessment & Plan:  Binocular vision disorder with diplopia Assessment & Plan: Recent onset of double vision causing significant distress.  Looks like this has been going on for at least 6 weeks now.  MRI of the brain performed and was reportedly normal. She has seen her eye doctor who recommended her to see neurology.  no neurology appointment scheduled. - Placed urgent referral to a neurologist at [facility] for evaluation. - Follow up on neurology appointment scheduling.  Orders: -     Ambulatory referral to Neurology  Polyarthritis with positive rheumatoid factor [facility]) Assessment & Plan: Awaiting rheumatology evaluation. She is placed on low-dose prednisone  but patient does not seem to have any relief.  Will monitor   History of thrombocytosis Assessment & Plan: RESOLVED.    Generalized weakness Assessment & Plan: Generalized weakness could be due to her chronic pain, decreased mobility, deconditioning and medications. She states she is doing physical therapy at home.   Chronic pain syndrome Assessment & Plan: Chronic lower back and buttock pain and pain OVERALL, chronic, worse on the right side, persistent despite multiple medications. CT scan from June shows slight compression fracture at L2 and lumbar arthritis. Current pain management includes multiple medications with no significant relief. Physical therapy and pain clinic consulted.  Patient is very frustrated that she is in a significant amount of pain without answers, all her pain medications are not helping her enough and that she is losing weight inappropriately even though her husband makes her calorie rich meals.  While it is reassuring that she had all normal blood work recently in the hospital and before and she had normal imaging including MRI of her brain and CT scans of her chest, abdomen and pelvis, the etiology of her symptoms is unclear which is indeed  frustrating.  PLAN:  unfortunately I do not have any  other recommendations for blood tests today.  I strongly feel her polypharmacy should be addressed.  She has missed an appointment with the pharmacist to discuss this as she was at the emergency room recently.  Advised to make an appointment again.  Until then, - Continue current pain management regimen: Subutex , diclofenac, Voltaren gel, Cymbalta , lidocaine patch, Percocet, prednisone , Lyrica , and Xanax. - Follow up with pharmacist to review and adjust medications. - Expedite rheumatology appointment for further treatment options.  She had positive ANA and rheumatoid factor but negative confirmatory testing.  She definitely will benefit from seeing the rheumatologist, but her current appointment is not until January.  Will try to expedite this.         Assessment and Plan            Assessment and Plan    Total time spent on today's visit was 50 minutes, including both face-to-face time and nonface-to-face time personally spent on review of chart (labs and imaging), discussing labs and goals, discussing further work-up, treatment options, referrals to specialist if needed, reviewing outside records of pertinent, answering patient's questions, and coordinating care.   No orders of the defined types were placed in this encounter.   Orders Placed This Encounter  Procedures   Ambulatory referral to Neurology     Follow-up: No follow-ups on file.    An After Visit Summary was printed and given to the patient.  Latyra Jaye, MD [facility] [phone]

## 2023-12-09 NOTE — Assessment & Plan Note (Signed)
 RESOLVED

## 2023-12-09 NOTE — Assessment & Plan Note (Signed)
 Recent onset of double vision causing significant distress.  Looks like this has been going on for at least 6 weeks now.  MRI of the brain performed and was reportedly normal. She has seen her eye doctor who recommended her to see neurology.  no neurology appointment scheduled. - Placed urgent referral to a neurologist at [facility] for evaluation. - Follow up on neurology appointment scheduling.

## 2023-12-09 NOTE — Assessment & Plan Note (Signed)
 Generalized weakness could be due to her chronic pain, decreased mobility, deconditioning and medications. She states she is doing physical therapy at home.

## 2023-12-13 ENCOUNTER — Encounter: Payer: Self-pay | Admitting: Neurology

## 2023-12-13 ENCOUNTER — Ambulatory Visit: Payer: Self-pay | Admitting: Neurology

## 2023-12-13 VITALS — BP 137/78 | HR 88 | Ht 64.0 in | Wt 196.0 lb

## 2023-12-13 DIAGNOSIS — H532 Diplopia: Secondary | ICD-10-CM

## 2023-12-13 NOTE — Patient Instructions (Signed)
-   Order blood test for myasthenia gravis antibodies. - Order MRI of the orbits if blood test is normal. - Refer to ophthalmology for further evaluation and potential prism  glasses.

## 2023-12-13 NOTE — Progress Notes (Signed)
 Noted that patient has not reached out to RS appt for medication review/polypharmacy. Care Guide attempted RS but was not able to connect with patient.   Will request for Care Guide to try once more.

## 2023-12-13 NOTE — Progress Notes (Signed)
 [facility]  PATIENT: Lauren Roth DOB: 26-Jan-1946  REQUESTING CLINICIAN: Cox, Kirsten, MD HISTORY FROM: Patient/Husband  REASON FOR VISIT: Double vision    HISTORICAL  CHIEF COMPLAINT:  Chief Complaint  Patient presents with   New Patient (Initial Visit)    RM 12 Double vision     HISTORY OF PRESENT ILLNESS:  Discussed the use of AI scribe software for clinical note transcription with the patient, who gave verbal consent to proceed.   Lauren Roth is a 78 year old female with multiple medical conditions including hypertension, hyperlipidemia, hypothyroidism, fibromyalgia, depression, anxiety, chronic pain syndrome who presents with double vision.  She experiences binocular diplopia, described as seeing two images side by side, which resolves upon closing one eye. The diplopia is persistent and exacerbates when she is fatigued or in the morning. No loss of vision is reported. There is also reports of headaches but feels that it is related to her TMJ.   An MRI of the brain conducted approximately ten days ago did not reveal any significant findings. She has not yet seen an eye doctor for this issue.  She has a history of TMJ disorder, which causes significant pain and is associated with headaches.  A couple of months ago, she fell and sustained a head injury, resulting in a large bump and bruising. An MRI at that time did not show any brain damage, but she continues to experience pain when bending over.  She has a history of cataract surgery and was scheduled for additional lens surgery, which was postponed. She is currently under evaluation for possible rheumatoid arthritis or lupus due to severe pain in her hands, shoulders, hips, and knees.    OTHER MEDICAL CONDITIONS: Hypertension, hyperlipidemia, hypothyroidism, fibromyalgia, depression, anxiety, chronic pain syndrome   REVIEW OF SYSTEMS: Full 14 system review of systems  performed and negative with exception of: As noted in the HPI   ALLERGIES: Allergies  Allergen Reactions   Cottonseed Oil Hives and Itching    Hives, Itching Raw Cotton    Codeine Sulfate Nausea And Vomiting    HOME MEDICATIONS: Outpatient Medications Prior to Visit  Medication Sig Dispense Refill   albuterol  (VENTOLIN  HFA) 108 (90 Base) MCG/ACT inhaler Inhale 2 puffs into the lungs every 6 (six) hours as needed for wheezing or shortness of breath. 8 g 2   aspirin  81 MG chewable tablet Chew 81 mg by mouth daily.     Biotin 10 MG TABS Take by mouth.     buprenorphine  (SUBUTEX ) 2 MG SUBL SL tablet Place 2 mg under the tongue daily.     buPROPion  (WELLBUTRIN  XL) 300 MG 24 hr tablet Take 1 tablet by mouth daily.     Calcium Carb-Cholecalciferol 500-10 MG-MCG TABS Take by mouth.     cloNIDine  (CATAPRES ) 0.1 MG tablet Take 1 tablet by mouth 2 (two) times daily.     diclofenac Sodium (VOLTAREN) 1 % GEL Apply 0.5 g topically 4 (four) times daily.     diltiazem  (CARDIZEM  CD) 120 MG 24 hr capsule Take 120 mg by mouth daily.     DULoxetine  (CYMBALTA ) 60 MG capsule Take 60 mg by mouth daily. (Patient taking differently: Take 60 mg by mouth 2 (two) times daily. Take 2 in the morning and 2 at night)     ergocalciferol (VITAMIN D2) 1.25 MG (50000 UT) capsule Take 50,000 Units by mouth once a week.     finasteride  (PROSCAR ) 5 MG tablet Take 5  mg by mouth daily.     fluticasone  (FLONASE ) 50 MCG/ACT nasal spray Place 1 spray into both nostrils daily as needed for allergies or rhinitis. 15.8 mL 1   furosemide  (LASIX ) 20 MG tablet Take 1 tablet (20 mg total) by mouth 2 (two) times daily. 180 tablet 0   iron polysaccharides (NIFEREX) 150 MG capsule Take 150 mg by mouth daily.     levothyroxine  (SYNTHROID ) 50 MCG tablet Take 1 tablet by mouth once daily 90 tablet 0   lidocaine (LIDODERM) 5 % Place onto the skin.     loratadine (CLARITIN) 10 MG tablet Take 10 mg by mouth daily.     losartan  (COZAAR ) 50 MG  tablet Take 50 mg by mouth daily.     Misc Natural Products (FIBER 7 PO) Take by mouth.     montelukast (SINGULAIR) 10 MG tablet Take 10 mg by mouth at bedtime as needed (allergies).     mupirocin ointment (BACTROBAN) 2 % 1 Application 2 (two) times daily.     nitroGLYCERIN  (NITROSTAT ) 0.4 MG SL tablet Place 1 tablet (0.4 mg total) under the tongue as needed for chest pain. 25 tablet 3   omeprazole  (PRILOSEC) 20 MG capsule Take 1 capsule (20 mg total) by mouth daily. 90 capsule 1   oxybutynin (DITROPAN-XL) 10 MG 24 hr tablet Take 10 mg by mouth at bedtime.     oxycodone -acetaminophen  (LYNOX) 2.5-300 MG per tablet Take 1 tablet by mouth every 4 (four) hours as needed for pain.     predniSONE  (DELTASONE ) 10 MG tablet Take 1 tablet (10 mg total) by mouth daily with breakfast. 30 tablet 0   pregabalin  (LYRICA ) 75 MG capsule Take 1 capsule (75 mg total) by mouth 3 (three) times daily. 90 capsule 2   propranolol ER (INDERAL LA) 60 MG 24 hr capsule Take 60 mg by mouth daily.     rOPINIRole  (REQUIP ) 1 MG tablet Take 1 tablet by mouth daily.     thiamine (VITAMIN B1) 100 MG tablet Take by mouth.     tiZANidine  (ZANAFLEX ) 4 MG tablet Take 4 mg by mouth at bedtime.     topiramate  (TOPAMAX ) 25 MG tablet Take 1 tablet (25 mg total) by mouth 2 (two) times daily. 180 tablet 0   traZODone  (DESYREL ) 100 MG tablet Take 100 mg by mouth at bedtime.     No facility-administered medications prior to visit.    PAST MEDICAL HISTORY: Past Medical History:  Diagnosis Date   Allergic rhinitis 01/16/2009   Qualifier: Diagnosis of  By: Kassie MD, Alyce LABOR   Formatting of this note might be different from the original. Overview:  Qualifier: Diagnosis of  By: Kassie MD, Alyce A   Arthritis    Chronic migraine w/o aura, not intractable, w/o stat migr    Chronic pain of right knee 06/12/2021   Chronic pain syndrome 07/30/2013   DEGENERATIVE DISC DISEASE, LUMBOSACRAL SPINE 01/16/2009   Qualifier: Diagnosis of  By:  Kassie MD, Alyce A    Depression    DIAPHORESIS 01/16/2009   Qualifier: Diagnosis of  By: Kassie MD, Sean A    Edema 01/16/2009   Qualifier: Diagnosis of  By: Kassie MD, Alyce LABOR   Formatting of this note might be different from the original. Overview:  Qualifier: Diagnosis of  By: Kassie MD, Sean A   Essential hypertension 01/28/2017   Essential thrombocytosis (HCC) 11/25/2017   Fibromyalgia    GERD 01/16/2009   Qualifier: Diagnosis of  By: Kassie MD,  Sean A    Heart failure (HCC) 05/08/2021   HYPERTENSION 01/16/2009   Qualifier: Diagnosis of  By: Kassie MD, Alyce A    Hypokalemia 01/16/2009   Qualifier: Diagnosis of  By: Kassie MD, Alyce LABOR   Formatting of this note might be different from the original. Overview:  Qualifier: Diagnosis of  By: Kassie MD, Sean A   Left hip pain 05/24/2019   Lumbosacral spondylosis 10/09/2010   Lymphedema 02/10/2021   Memory loss 01/16/2009   Qualifier: Diagnosis of  By: Kassie MD, Alyce LABOR   Formatting of this note might be different from the original. Overview:  Qualifier: Diagnosis of  By: Kassie MD, Sean A   Menopausal and postmenopausal disorder 01/16/2009   Qualifier: Diagnosis of  By: Kassie MD, Alyce LABOR   Formatting of this note might be different from the original. Overview:  Qualifier: Diagnosis of  By: Kassie MD, Sean A   Moderate aortic regurgitation 05/21/2019   Neuropathic pain 09/25/2020   Neuropathy    Obesity (BMI 30.0-34.9) 10/13/2021   Pain in soft tissues of limb 06/20/2012   Formatting of this note might be different from the original. Overview:  IMPRESSION: Left knee pain Formatting of this note might be different from the original. IMPRESSION: Left knee pain   Pain in thoracic spine 07/30/2013   Palpitations 01/28/2017   Primary osteoarthritis of right knee 10/06/2014   Restless legs syndrome 08/25/2010   Shortness of breath on exertion 01/28/2017   SI (sacroiliac) pain 05/10/2016   Thoracic spondylosis 11/20/2010    Overview:  Overview:  IMPRESSION: Thoracic radiculopathy Overview:  IMPRESSION: Thoracic radiculopathy  Formatting of this note might be different from the original. Overview:  IMPRESSION: Thoracic radiculopathy Formatting of this note might be different from the original. IMPRESSION: Thoracic radiculopathy   Thrombocythemia, essential (HCC) 09/12/2013   Thyroid  disease    TMJ (dislocation of temporomandibular joint) 07/30/2013   Tremor 12/29/2021   Unspecified hypothyroidism 01/16/2009   Qualifier: Diagnosis of  By: Kassie MD, Sean A    Uterine cancer (HCC) 07/30/2013    PAST SURGICAL HISTORY: Past Surgical History:  Procedure Laterality Date   APPENDECTOMY     CERVICAL DISC SURGERY     CHOLECYSTECTOMY     FRACTURE SURGERY Left    Left arm has a metal plate   KNEE SURGERY Bilateral    TKR   ROBOTIC ASSISTED LAP VAGINAL HYSTERECTOMY     did not take ovaries. had uterine cancer.    FAMILY HISTORY: Family History  Problem Relation Age of Onset   Deep vein thrombosis Father    Heart disease Father    Heart failure Father    Alzheimer's disease Father    Heart disease Brother    Pulmonary embolism Brother    Heart disease Brother     SOCIAL HISTORY: Social History   Socioeconomic History   Marital status: Married    Spouse name: Not on file   Number of children: Not on file   Years of education: Not on file   Highest education level: GED or equivalent  Occupational History   Not on file  Tobacco Use   Smoking status: Former   Smokeless tobacco: Never   Tobacco comments:    at age 42  Vaping Use   Vaping status: Never Used  Substance and Sexual Activity   Alcohol use: No   Drug use: No   Sexual activity: Not on file  Other Topics Concern   Not on file  Social History Narrative   Right handed   Lives at home with husband   Social Drivers of Health   Financial Resource Strain: Low Risk  (03/21/2023)   Overall Financial Resource Strain (CARDIA)     Difficulty of Paying Living Expenses: Not hard at all  Food Insecurity: No Food Insecurity (10/11/2023)   Hunger Vital Sign    Worried About Running Out of Food in the Last Year: Never true    Ran Out of Food in the Last Year: Never true  Transportation Needs: No Transportation Needs (10/11/2023)   PRAPARE - Administrator, Civil Service (Medical): No    Lack of Transportation (Non-Medical): No  Physical Activity: Inactive (03/21/2023)   Exercise Vital Sign    Days of Exercise per Week: 0 days    Minutes of Exercise per Session: 0 min  Stress: No Stress Concern Present (03/21/2023)   [employer] of Occupational Health - Occupational Stress Questionnaire    Feeling of Stress : Not at all  Social Connections: Socially Integrated (10/11/2023)   Social Connection and Isolation Panel    Frequency of Communication with Friends and Family: More than three times a week    Frequency of Social Gatherings with Friends and Family: Twice a week    Attends Religious Services: More than 4 times per year    Active Member of [employer] or Organizations: Yes    Attends Banker Meetings: More than 4 times per year    Marital Status: Married  Catering manager Violence: Not At Risk (10/11/2023)   Humiliation, Afraid, Rape, and Kick questionnaire    Fear of Current or Ex-Partner: No    Emotionally Abused: No    Physically Abused: No    Sexually Abused: No    PHYSICAL EXAM  GENERAL EXAM/CONSTITUTIONAL: Vitals:  Vitals:   12/13/23 1548 12/13/23 1552  BP: (!) 160/82 137/78  Pulse: 88   SpO2: 99%   Weight: 196 lb (88.9 kg)   Height: 5' 4 (1.626 m)    Body mass index is 33.64 kg/m. Wt Readings from Last 3 Encounters:  12/13/23 196 lb (88.9 kg)  12/09/23 198 lb (89.8 kg)  11/30/23 210 lb (95.3 kg)   Patient is in no distress; well developed, nourished and groomed; neck is supple  MUSCULOSKELETAL: Gait, strength, tone, movements noted in Neurologic exam  below  NEUROLOGIC: MENTAL STATUS:     03/21/2023    2:31 PM  MMSE - Mini Mental State Exam  Not completed: Unable to complete   awake, alert, oriented to person, place and time recent and remote memory intact normal attention and concentration language fluent, comprehension intact, naming intact fund of knowledge appropriate  CRANIAL NERVE:  2nd, 3rd, 4th, 6th - Visual fields full to confrontation, extraocular muscles intact, no nystagmus. Reports diplopia when looking at my fingers.  5th - facial sensation symmetric 7th - facial strength symmetric 8th - hearing intact 9th - palate elevates symmetrically, uvula midline 11th - shoulder shrug symmetric 12th - tongue protrusion midline  MOTOR:  normal bulk and tone, full strength in the BUE, BLE  SENSORY:  normal and symmetric to light touch  COORDINATION:  finger-nose-finger, fine finger movements normal  GAIT/STATION:  normal   DIAGNOSTIC DATA (LABS, IMAGING, TESTING) - I reviewed patient records, labs, notes, testing and imaging myself where available.  Lab Results  Component Value Date   WBC 7.3 11/02/2023   HGB 13.4 11/02/2023   HCT 41.2 11/02/2023   MCV 92  11/02/2023   PLT 369 11/02/2023      Component Value Date/Time   NA 140 11/02/2023 1457   K 4.4 11/02/2023 1457   CL 99 11/02/2023 1457   CO2 26 11/02/2023 1457   GLUCOSE 124 (H) 11/02/2023 1457   GLUCOSE 122 (H) 10/11/2023 0548   BUN 13 11/02/2023 1457   CREATININE 0.89 11/02/2023 1457   CALCIUM 9.8 11/02/2023 1457   PROT 7.2 11/02/2023 1457   ALBUMIN 4.5 11/02/2023 1457   AST 17 11/02/2023 1457   ALT 15 11/02/2023 1457   ALKPHOS 96 11/02/2023 1457   BILITOT 0.2 11/02/2023 1457   GFRNONAA >60 10/11/2023 0548   Lab Results  Component Value Date   CHOL 209 (H) 02/03/2023   HDL 44 02/03/2023   LDLCALC 137 (H) 02/03/2023   TRIG 155 (H) 02/03/2023   CHOLHDL 4.8 (H) 02/03/2023   Lab Results  Component Value Date   HGBA1C 5.5 02/03/2023    No results found for: [id number] Lab Results  Component Value Date   TSH 1.210 11/02/2023    MRI Brain 12/03/2023 1. No acute intracranial abnormality.  2. Multifocal hyperintense T2-weighted signal within the cerebral white matter, most commonly due to chronic small vessel disease.     ASSESSMENT AND PLAN  78 y.o. year old female with   Assessment and Plan  Diplopia Chronic diplopia with images appearing side by side, worsened in the morning and when fatigued. No associated loss of vision. Normal eye movement and position. Differential diagnosis includes myasthenia gravis and potential orbital issues. Recent MRI of the brain was unremarkable. Previous fall with head trauma may have contributed, though no brain injury was detected on MRI. TMJ pain and headaches are present but not believed to be related to the diplopia. - Order blood test for myasthenia gravis antibodies. - Order MRI of the orbits if blood test is normal. - Refer to ophthalmology for further evaluation and potential prism  glasses.    1. Diplopia      Patient Instructions  - Order blood test for myasthenia gravis antibodies. - Order MRI of the orbits if blood test is normal. - Refer to ophthalmology for further evaluation and potential prism  glasses.   Orders Placed This Encounter  Procedures   Acetylcholine receptor, binding    No orders of the defined types were placed in this encounter.   Return if symptoms worsen or fail to improve.    Pastor Falling, MD 12/13/2023, 10:49 PM  [facility] [address] [place], [place] [address] [phone]

## 2023-12-14 NOTE — Progress Notes (Signed)
 Complex Care Management Care Guide Note  12/14/2023 Name: Lauren Roth MRN: [id number] DOB: 23-Jul-1945  Lauren Roth is a 78 y.o. year old female who is a primary care patient of Cox, Kirsten, MD and is actively engaged with the care management team. I reached out to [employer] by phone today to assist with re-scheduling  with the Pharmacist.  Follow up plan: Telephone appointment with complex care management team member scheduled for:  12/21/23 at 2:00 p.m.   Dreama [facility]  [facility], [facility] VBCI Assistant Direct Dial: [phone]  Fax: [phone]

## 2023-12-15 ENCOUNTER — Other Ambulatory Visit: Payer: Self-pay

## 2023-12-15 LAB — ACETYLCHOLINE RECEPTOR, BINDING: AChR Binding Ab, Serum: <0.07 nmol/L (ref 0.00–0.24)

## 2023-12-15 NOTE — Patient Instructions (Signed)
 Visit Information  Thank you for taking time to visit with me today. Please don't hesitate to contact me if I can be of assistance to you before our next scheduled appointment.  Our next appointment is by telephone on 12/28/23 at 10:30am Please call the care guide team at [phone] if you need to cancel or reschedule your appointment.   Following is a copy of your care plan:   Goals Addressed             This Visit's Progress    VBCI RN Care Plan       Problems:  Chronic Disease Management support and education needs related to Pain Fall Risk  Goal: Over the next 90 days the Patient will continue to work with RN Care Manager and/or Social Worker to address care management and care coordination needs related to Pain, Fall risk as evidenced by adherence to care management team scheduled appointments     experience decrease in ED visits as evidenced by electronic medical record review; ED visits in last 6 months = 3 and 1 hospital admission 10/10/23-10/13/23  Interventions:   Falls Interventions: Provided written and verbal education re: potential causes of falls and Fall prevention strategies Screening for signs and symptoms of depression related to chronic disease state  Assessed social determinant of health barriers Advised to work with therapist and perform exercises as recommended by therapist  Pain Interventions: Pain assessment performed Medications reviewed Reviewed provider established plan for pain management Discussed importance of adherence to all scheduled medical appointments Screening for signs and symptoms of depression related to chronic disease state   Patient Self-Care Activities:  Attend all scheduled provider appointments Call provider office for new concerns or questions  Take medications as prescribed   Maintain fall safety precautions  Plan:  Telephone follow up appointment with care management team member scheduled for:  12/28/23 at 10:30 am            Please call the Suicide and Crisis Lifeline: 988 call the [place]  National Suicide Prevention Lifeline: 936 789 4567 or TTY: 929-766-2349 TTY 832 298 8769) to talk to a trained counselor call 1-800-273-TALK (toll free, 24 hour hotline) if you are experiencing a Mental Health or Behavioral Health Crisis or need someone to talk to.  Patient verbalizes understanding of instructions and care plan provided today and agrees to view in MyChart. Active MyChart status and patient understanding of how to access instructions and care plan via MyChart confirmed with patient.     Heddy Shutter, RN, MSN, BSN, CCM [place]  [facility], Population Health Case Manager Phone: [phone]

## 2023-12-15 NOTE — Patient Outreach (Signed)
 Complex Care Management   Visit Note  12/15/2023  Name:  Lauren Roth MRN: [id number] DOB: Aug 20, 1945  Situation: Referral received for Complex Care Management related to Pain and fall risk I obtained verbal consent from Patient.  Visit completed with Patient  on the phone  Background:   Past Medical History:  Diagnosis Date   Allergic rhinitis 01/16/2009   Qualifier: Diagnosis of  By: Kassie MD, Alyce DELENA Deal of this note might be different from the original. Overview:  Qualifier: Diagnosis of  By: Kassie MD, Alyce A   Arthritis    Chronic migraine w/o aura, not intractable, w/o stat migr    Chronic pain of right knee 06/12/2021   Chronic pain syndrome 07/30/2013   DEGENERATIVE DISC DISEASE, LUMBOSACRAL SPINE 01/16/2009   Qualifier: Diagnosis of  By: Kassie MD, Sean A    Depression    DIAPHORESIS 01/16/2009   Qualifier: Diagnosis of  By: Kassie MD, Alyce A    Edema 01/16/2009   Qualifier: Diagnosis of  By: Kassie MD, Alyce DELENA   Formatting of this note might be different from the original. Overview:  Qualifier: Diagnosis of  By: Kassie MD, Sean A   Essential hypertension 01/28/2017   Essential thrombocytosis (HCC) 11/25/2017   Fibromyalgia    GERD 01/16/2009   Qualifier: Diagnosis of  By: Kassie MD, Sean A    Heart failure (HCC) 05/08/2021   HYPERTENSION 01/16/2009   Qualifier: Diagnosis of  By: Kassie MD, Sean A    Hypokalemia 01/16/2009   Qualifier: Diagnosis of  By: Kassie MD, Alyce DELENA   Formatting of this note might be different from the original. Overview:  Qualifier: Diagnosis of  By: Kassie MD, Sean A   Left hip pain 05/24/2019   Lumbosacral spondylosis 10/09/2010   Lymphedema 02/10/2021   Memory loss 01/16/2009   Qualifier: Diagnosis of  By: Kassie MD, Alyce DELENA   Formatting of this note might be different from the original. Overview:  Qualifier: Diagnosis of  By: Kassie MD, Sean A   Menopausal and postmenopausal disorder 01/16/2009   Qualifier:  Diagnosis of  By: Kassie MD, Alyce DELENA   Formatting of this note might be different from the original. Overview:  Qualifier: Diagnosis of  By: Kassie MD, Sean A   Moderate aortic regurgitation 05/21/2019   Neuropathic pain 09/25/2020   Neuropathy    Obesity (BMI 30.0-34.9) 10/13/2021   Pain in soft tissues of limb 06/20/2012   Formatting of this note might be different from the original. Overview:  IMPRESSION: Left knee pain Formatting of this note might be different from the original. IMPRESSION: Left knee pain   Pain in thoracic spine 07/30/2013   Palpitations 01/28/2017   Primary osteoarthritis of right knee 10/06/2014   Restless legs syndrome 08/25/2010   Shortness of breath on exertion 01/28/2017   SI (sacroiliac) pain 05/10/2016   Thoracic spondylosis 11/20/2010   Overview:  Overview:  IMPRESSION: Thoracic radiculopathy Overview:  IMPRESSION: Thoracic radiculopathy  Formatting of this note might be different from the original. Overview:  IMPRESSION: Thoracic radiculopathy Formatting of this note might be different from the original. IMPRESSION: Thoracic radiculopathy   Thrombocythemia, essential (HCC) 09/12/2013   Thyroid  disease    TMJ (dislocation of temporomandibular joint) 07/30/2013   Tremor 12/29/2021   Unspecified hypothyroidism 01/16/2009   Qualifier: Diagnosis of  By: Kassie MD, Sean A    Uterine cancer (HCC) 07/30/2013    Assessment: Patient reports increased pain the past several weeks  and is being evaluated for Myasthenia Gravis by neurology. In addition has an upcoming appointment with Rheumatology to evaluate for Rheumatoid arthritis. She notes some better, but still reports overall general pain as 7/10.   Patient Reported Symptoms:  Cognitive Cognitive Status: No symptoms reported, Normal speech and language skills, Insightful and able to interpret abstract concepts, Alert and oriented to person, place, and time      Neurological Neurological Review of Symptoms:  Vision changes (diplopia, 12/13/23 neurology visit and evaluating for possible myasthenia gravis.) Neurological Management Strategies: Routine screening, Medication therapy Neurological Self-Management Outcome: 3 (uncertain)  HEENT HEENT Symptoms Reported: No symptoms reported      Cardiovascular Cardiovascular Symptoms Reported: Swelling in legs or feet, No symptoms reported (patient reports lymphadema to both lower extremites and is treated at the [facility] clinic) Does patient have uncontrolled Hypertension?: No Cardiovascular Management Strategies: Medication therapy Weight: 194 lb (88 kg) (home reading) Cardiovascular Self-Management Outcome: 3 (uncertain) Cardiovascular Comment: Patient reports she weighs self daily and checke BP daily. Patient reports BP 137/73 HR 107 today. patient reports HR elevated because she was rushing prior to taking readings. Patient states she has a scheduled OT visit today who will recheck her BP and pulse. RNCM encouraged patient to notify provider if remains elevated.  Respiratory Respiratory Symptoms Reported: No symptoms reported    Endocrine Endocrine Symptoms Reported: No symptoms reported    Gastrointestinal Gastrointestinal Symptoms Reported: No symptoms reported      Genitourinary Genitourinary Symptoms Reported: No symptoms reported    Integumentary Integumentary Symptoms Reported: No symptoms reported    Musculoskeletal Musculoskelatal Symptoms Reviewed: Muscle pain, Joint pain, Limited mobility, Difficulty walking, Unsteady gait, Weakness, Back pain Additional Musculoskeletal Details: Reports currently issues with 1) back pain 3/10 while sitting and states activity makes it worse 2) R Knee pain 610, 3) L knee pain 3/10, 4) Neck pain 3/10. Patient reports Generalized pain a few weeks ago greater than 10 on 10/10 pain scale. Adding pain centered in all those joint. She reports pain improved and is better now is currently 7/10. Patient saw  neurologist on 12/13/23 and is being evaluzated for myasthenia gravis and has upcoming appointment with Rheumatologist re: evaluation for Rheumatoid Arthritis. per review of chart history of chronic pain syndrome-followed by pain clinic Musculoskeletal Management Strategies: Medication therapy, Routine screening, Exercise, Medical device, Adequate rest Musculoskeletal Self-Management Outcome: 3 (uncertain) Falls in the past year?: Yes Number of falls in past year: 2 or more Was there an injury with Fall?: Yes Fall Risk Category Calculator: 3 Patient Fall Risk Level: High Fall Risk    Psychosocial Psychosocial Symptoms Reported: Depression - if selected complete PHQ 2-9 Additional Psychological Details: patient states she has had some depression over the past weeks when the pain was so bad, but states, I am better now. She is aware LCSW calling to assess further.     Quality of Family Relationships: involved, supportive Do you feel physically threatened by others?: No    12/15/2023    PHQ2-9 Depression Screening   Little interest or pleasure in doing things Not at all  Feeling down, depressed, or hopeless Not at all  PHQ-2 - Total Score 0  Trouble falling or staying asleep, or sleeping too much    Feeling tired or having little energy    Poor appetite or overeating     Feeling bad about yourself - or that you are a failure or have let yourself or your family down    Trouble concentrating  on things, such as reading the newspaper or watching television    Moving or speaking so slowly that other people could have noticed.  Or the opposite - being so fidgety or restless that you have been moving around a lot more than usual    Thoughts that you would be better off dead, or hurting yourself in some way    PHQ2-9 Total Score    If you checked off any problems, how difficult have these problems made it for you to do your work, take care of things at home, or get along with other people     Depression Interventions/Treatment      Vitals:   12/15/23 1006  BP: 137/73  Pulse: (!) 107    Medications Reviewed Today     Reviewed by Zakiyah Diop M, RN (Registered Nurse) on 12/15/23 at 1011  Med List Status: <None>   Medication Order Taking? Sig Documenting Provider Last Dose Status Informant  albuterol  (VENTOLIN  HFA) 108 (90 Base) MCG/ACT inhaler [id number] Yes Inhale 2 puffs into the lungs every 6 (six) hours as needed for wheezing or shortness of breath. Teressa Harrie HERO, FNP  Active   aspirin  81 MG chewable tablet [id number] Yes Chew 81 mg by mouth daily. [provider]  Active Self  Biotin 10 MG TABS [id number] Yes Take by mouth. [provider]  Active Self  buprenorphine  (SUBUTEX ) 2 MG SUBL SL tablet [id number] Yes Place 2 mg under the tongue daily. [provider]  Active   buPROPion  (WELLBUTRIN  XL) 300 MG 24 hr tablet [id number] Yes Take 1 tablet by mouth daily. [provider]  Active Self  Calcium Carb-Cholecalciferol 500-10 MG-MCG TABS [id number] Yes Take by mouth. [provider]  Active Self  cloNIDine  (CATAPRES ) 0.1 MG tablet [id number] Yes Take 1 tablet by mouth 2 (two) times daily. [provider]  Active Self  diclofenac Sodium (VOLTAREN) 1 % GEL [id number] Yes Apply 0.5 g topically 4 (four) times daily. [provider]  Active Self  diltiazem  (CARDIZEM  CD) 120 MG 24 hr capsule [id number] Yes Take 120 mg by mouth daily. [provider]  Active Self  DULoxetine  (CYMBALTA ) 60 MG capsule [id number] Yes Take 60 mg by mouth daily.  Patient taking differently: Take 60 mg by mouth daily.   [provider]  Active Self  ergocalciferol (VITAMIN D2) 1.25 MG (50000 UT) capsule [id number] Yes Take 50,000 Units by mouth once a week. [provider]  Active Self  finasteride  (PROSCAR ) 5 MG tablet [id number] Yes Take 5 mg by mouth daily. [provider]  Active Self  fluticasone  (FLONASE ) 50  MCG/ACT nasal spray [id number] Yes Place 1 spray into both nostrils daily as needed for allergies or rhinitis. Cox, Kirsten, MD  Active Self  furosemide  (LASIX ) 20 MG tablet [id number] Yes Take 1 tablet (20 mg total) by mouth 2 (two) times daily. Teressa Harrie HERO, FNP  Active   iron polysaccharides (NIFEREX) 150 MG capsule [id number] Yes Take 150 mg by mouth daily. [provider]  Active Self  levothyroxine  (SYNTHROID ) 50 MCG tablet [id number] Yes Take 1 tablet by mouth once daily Cox, Kirsten, MD  Active Self  lidocaine (LIDODERM) 5 % [id number] Yes Place onto the skin. [provider]  Active Self  loratadine (CLARITIN) 10 MG tablet [id number] Yes Take 10 mg by mouth daily. [provider]  Active Self  losartan  (COZAAR ) 50 MG tablet [id number] Yes Take 50 mg by mouth daily. [provider]  Active  Misc Natural Products (FIBER 7 PO) [id number] Yes Take by mouth. [provider]  Active Self  montelukast (SINGULAIR) 10 MG tablet [id number] Yes Take 10 mg by mouth at bedtime as needed (allergies). [provider]  Active Self  mupirocin ointment (BACTROBAN) 2 % [id number]  1 Application 2 (two) times daily.  Patient not taking: Reported on 12/15/2023   [provider]  Active   nitroGLYCERIN  (NITROSTAT ) 0.4 MG SL tablet [id number] Yes Place 1 tablet (0.4 mg total) under the tongue as needed for chest pain. Cox, Kirsten, MD  Active Self  omeprazole  (PRILOSEC) 20 MG capsule [id number] Yes Take 1 capsule (20 mg total) by mouth daily. Cox, Kirsten, MD  Active Self  oxybutynin (DITROPAN-XL) 10 MG 24 hr tablet [id number] Yes Take 10 mg by mouth at bedtime. [provider]  Active   oxycodone -acetaminophen  (LYNOX) 2.5-300 MG per tablet [id number] Yes Take 1 tablet by mouth every 4 (four) hours as needed for pain. [provider]  Active   predniSONE  (DELTASONE ) 10 MG tablet [id number] Yes Take 1 tablet (10 mg total) by mouth daily with  breakfast. Cox, Kirsten, MD  Active   pregabalin  (LYRICA ) 75 MG capsule [id number] Yes Take 1 capsule (75 mg total) by mouth 3 (three) times daily. Teressa Harrie HERO, FNP  Active   propranolol ER (INDERAL LA) 60 MG 24 hr capsule [id number] Yes Take 60 mg by mouth daily. [provider]  Active   rOPINIRole  (REQUIP ) 1 MG tablet [id number] Yes Take 1 tablet by mouth daily. [provider]  Active Self  thiamine (VITAMIN B1) 100 MG tablet [id number] Yes Take by mouth. [provider]  Active Self  tiZANidine  (ZANAFLEX ) 4 MG tablet [id number] Yes Take 4 mg by mouth at bedtime. [provider]  Active Self  topiramate  (TOPAMAX ) 25 MG tablet [id number] Yes Take 1 tablet (25 mg total) by mouth 2 (two) times daily. Teressa Harrie HERO, FNP  Active   traZODone  (DESYREL ) 100 MG tablet [id number] Yes Take 100 mg by mouth at bedtime. [provider]  Active Self          Recommendation:   Specialty provider follow-up 12/16/23 LCSW, 12/21/23 PCP, 01/10/24 pain clinic; 10/1/ pcp; 02/08/24 rheumatology Call to schedule appointment with your eye doctor as recommended by neurologist Patient to continue to work with therapist and perform exercises as recommended by therapist.  Follow Up Plan:   Telephone follow up appointment date/time:  12/28/23 at 10:30 am  Heddy Shutter, RN, MSN, BSN, CCM [facility], Population Health Case Manager Phone: [phone]

## 2023-12-16 ENCOUNTER — Ambulatory Visit: Payer: Self-pay | Admitting: Neurology

## 2023-12-16 ENCOUNTER — Other Ambulatory Visit: Payer: Self-pay | Admitting: Licensed Clinical Social Worker

## 2023-12-16 DIAGNOSIS — H532 Diplopia: Secondary | ICD-10-CM

## 2023-12-16 NOTE — Patient Outreach (Signed)
 Complex Care Management   Visit Note  12/16/2023  Name:  Lauren Roth MRN: [id number] DOB: [birth year]  Situation: Referral received for Complex Care Management related to Mental/Behavioral Health diagnosis Depression I obtained verbal consent from Patient.  Visit completed with Patient  on the phone  Background:   Past Medical History:  Diagnosis Date   Allergic rhinitis 01/16/2009   Qualifier: Diagnosis of  By: Kassie MD, Alyce DELENA Deal of this note might be different from the original. Overview:  Qualifier: Diagnosis of  By: Kassie MD, Alyce A   Arthritis    Chronic migraine w/o aura, not intractable, w/o stat migr    Chronic pain of right knee 06/12/2021   Chronic pain syndrome 07/30/2013   DEGENERATIVE DISC DISEASE, LUMBOSACRAL SPINE 01/16/2009   Qualifier: Diagnosis of  By: Kassie MD, Sean A    Depression    DIAPHORESIS 01/16/2009   Qualifier: Diagnosis of  By: Kassie MD, Alyce A    Edema 01/16/2009   Qualifier: Diagnosis of  By: Kassie MD, Alyce DELENA   Formatting of this note might be different from the original. Overview:  Qualifier: Diagnosis of  By: Kassie MD, Sean A   Essential hypertension 01/28/2017   Essential thrombocytosis (HCC) 11/25/2017   Fibromyalgia    GERD 01/16/2009   Qualifier: Diagnosis of  By: Kassie MD, Sean A    Heart failure (HCC) 05/08/2021   HYPERTENSION 01/16/2009   Qualifier: Diagnosis of  By: Kassie MD, Sean A    Hypokalemia 01/16/2009   Qualifier: Diagnosis of  By: Kassie MD, Alyce DELENA   Formatting of this note might be different from the original. Overview:  Qualifier: Diagnosis of  By: Kassie MD, Sean A   Left hip pain 05/24/2019   Lumbosacral spondylosis 10/09/2010   Lymphedema 02/10/2021   Memory loss 01/16/2009   Qualifier: Diagnosis of  By: Kassie MD, Alyce DELENA   Formatting of this note might be different from the original. Overview:  Qualifier: Diagnosis of  By: Kassie MD, Sean A   Menopausal and postmenopausal  disorder 01/16/2009   Qualifier: Diagnosis of  By: Kassie MD, Alyce DELENA   Formatting of this note might be different from the original. Overview:  Qualifier: Diagnosis of  By: Kassie MD, Sean A   Moderate aortic regurgitation 05/21/2019   Neuropathic pain 09/25/2020   Neuropathy    Obesity (BMI 30.0-34.9) 10/13/2021   Pain in soft tissues of limb 06/20/2012   Formatting of this note might be different from the original. Overview:  IMPRESSION: Left knee pain Formatting of this note might be different from the original. IMPRESSION: Left knee pain   Pain in thoracic spine 07/30/2013   Palpitations 01/28/2017   Primary osteoarthritis of right knee 10/06/2014   Restless legs syndrome 08/25/2010   Shortness of breath on exertion 01/28/2017   SI (sacroiliac) pain 05/10/2016   Thoracic spondylosis 11/20/2010   Overview:  Overview:  IMPRESSION: Thoracic radiculopathy Overview:  IMPRESSION: Thoracic radiculopathy  Formatting of this note might be different from the original. Overview:  IMPRESSION: Thoracic radiculopathy Formatting of this note might be different from the original. IMPRESSION: Thoracic radiculopathy   Thrombocythemia, essential (HCC) 09/12/2013   Thyroid  disease    TMJ (dislocation of temporomandibular joint) 07/30/2013   Tremor 12/29/2021   Unspecified hypothyroidism 01/16/2009   Qualifier: Diagnosis of  By: Kassie MD, Sean A    Uterine cancer (HCC) 07/30/2013    Assessment: Patient Reported Symptoms:  Cognitive Cognitive Status: Alert  and oriented to person, place, and time, Normal speech and language skills, Insightful and able to interpret abstract concepts Cognitive/Intellectual Conditions Management [RPT]: None reported or documented in medical history or problem list      Neurological Neurological Review of Symptoms: Vision changes Neurological Management Strategies: Routine screening, Medication therapy Neurological Self-Management Outcome: 3 (uncertain)  HEENT HEENT  Symptoms Reported: No symptoms reported      Cardiovascular Cardiovascular Symptoms Reported: Swelling in legs or feet, No symptoms reported Does patient have uncontrolled Hypertension?: No Cardiovascular Management Strategies: Medication therapy Cardiovascular Self-Management Outcome: 3 (uncertain)  Respiratory Respiratory Symptoms Reported: No symptoms reported    Endocrine Endocrine Symptoms Reported: No symptoms reported    Gastrointestinal Gastrointestinal Symptoms Reported: No symptoms reported      Genitourinary Genitourinary Symptoms Reported: No symptoms reported    Integumentary Integumentary Symptoms Reported: No symptoms reported    Musculoskeletal Musculoskelatal Symptoms Reviewed: Difficulty walking, Joint pain, Limited mobility, Unsteady gait, Weakness Musculoskeletal Management Strategies: Medication therapy, Routine screening, Medical device, Adequate rest Musculoskeletal Self-Management Outcome: 3 (uncertain)      Psychosocial Psychosocial Symptoms Reported: Depression - if selected complete PHQ 2-9 Additional Psychological Details: Pt reported her depressive symptoms were very high while she was experiencing high pain scores earier this month. Her depression has improved since then - pt is seen a MH provider and has a very good relationship with her. We discussed her current stressors - including ongoing physical health problems. Pt denied any MH needs at this time. CSW encouraged her to reach out with any canges. Behavioral Management Strategies: Activity, Support system, Medication therapy, Coping strategies Behavioral Health Self-Management Outcome: 4 (good) Techniques to Cope with Loss/Stress/Change: Counseling, Diversional activities, Spiritual practice(s) Quality of Family Relationships: involved, helpful, supportive Do you feel physically threatened by others?: No    12/16/2023    PHQ2-9 Depression Screening   Little interest or pleasure in doing things Not  at all  Feeling down, depressed, or hopeless Not at all  PHQ-2 - Total Score 0  Trouble falling or staying asleep, or sleeping too much    Feeling tired or having little energy    Poor appetite or overeating     Feeling bad about yourself - or that you are a failure or have let yourself or your family down    Trouble concentrating on things, such as reading the newspaper or watching television    Moving or speaking so slowly that other people could have noticed.  Or the opposite - being so fidgety or restless that you have been moving around a lot more than usual    Thoughts that you would be better off dead, or hurting yourself in some way    PHQ2-9 Total Score    If you checked off any problems, how difficult have these problems made it for you to do your work, take care of things at home, or get along with other people    Depression Interventions/Treatment      There were no vitals filed for this visit.  Medications Reviewed Today     Reviewed by Kit Alm LABOR, LCSW (Social Worker) on 12/16/23 at 1418  Med List Status: <None>   Medication Order Taking? Sig Documenting Provider Last Dose Status Informant  albuterol  (VENTOLIN  HFA) 108 (90 Base) MCG/ACT inhaler [id number]  Inhale 2 puffs into the lungs every 6 (six) hours as needed for wheezing or shortness of breath. Teressa Harrie HERO, FNP  Active   aspirin  81 MG chewable tablet [id number]  Chew 81 mg by mouth daily. [provider]  Active Self  Biotin 10 MG TABS [id number]  Take by mouth. [provider]  Active Self  buprenorphine  (SUBUTEX ) 2 MG SUBL SL tablet [id number]  Place 2 mg under the tongue daily. [provider]  Active   buPROPion  (WELLBUTRIN  XL) 300 MG 24 hr tablet [id number]  Take 1 tablet by mouth daily. [provider]  Active Self  Calcium Carb-Cholecalciferol 500-10 MG-MCG TABS [id number]  Take by mouth. [provider]  Active Self  cloNIDine  (CATAPRES ) 0.1 MG tablet [id number]   Take 1 tablet by mouth 2 (two) times daily. [provider]  Active Self  diclofenac Sodium (VOLTAREN) 1 % GEL [id number]  Apply 0.5 g topically 4 (four) times daily. [provider]  Active Self  diltiazem  (CARDIZEM  CD) 120 MG 24 hr capsule [id number]  Take 120 mg by mouth daily. [provider]  Active Self  DULoxetine  (CYMBALTA ) 60 MG capsule [id number]  Take 60 mg by mouth daily.  Patient taking differently: Take 60 mg by mouth daily.   [provider]  Active Self  ergocalciferol (VITAMIN D2) 1.25 MG (50000 UT) capsule [id number]  Take 50,000 Units by mouth once a week. [provider]  Active Self  finasteride  (PROSCAR ) 5 MG tablet [id number]  Take 5 mg by mouth daily. [provider]  Active Self  fluticasone  (FLONASE ) 50 MCG/ACT nasal spray [id number]  Place 1 spray into both nostrils daily as needed for allergies or rhinitis. Cox, Kirsten, MD  Active Self  furosemide  (LASIX ) 20 MG tablet [id number]  Take 1 tablet (20 mg total) by mouth 2 (two) times daily. Teressa Harrie HERO, FNP  Active   iron polysaccharides (NIFEREX) 150 MG capsule [id number]  Take 150 mg by mouth daily. [provider]  Active Self  levothyroxine  (SYNTHROID ) 50 MCG tablet [id number]  Take 1 tablet by mouth once daily Cox, Kirsten, MD  Active Self  lidocaine (LIDODERM) 5 % [id number]  Place onto the skin. [provider]  Active Self  loratadine (CLARITIN) 10 MG tablet [id number]  Take 10 mg by mouth daily. [provider]  Active Self  losartan  (COZAAR ) 50 MG tablet [id number]  Take 50 mg by mouth daily. [provider]  Active   Misc Natural Products (FIBER 7 PO) [id number]  Take by mouth. [provider]  Active Self  montelukast (SINGULAIR) 10 MG tablet [id number]  Take 10 mg by mouth at bedtime as needed (allergies). [provider]  Active Self  mupirocin ointment (BACTROBAN) 2 % [id number]  1 Application 2 (two) times daily.   Patient not taking: Reported on 12/15/2023   [provider]  Active   nitroGLYCERIN  (NITROSTAT ) 0.4 MG SL tablet [id number]  Place 1 tablet (0.4 mg total) under the tongue as needed for chest pain. Cox, Kirsten, MD  Active Self  omeprazole  (PRILOSEC) 20 MG capsule [id number]  Take 1 capsule (20 mg total) by mouth daily. Cox, Kirsten, MD  Active Self  oxybutynin (DITROPAN-XL) 10 MG 24 hr tablet [id number]  Take 10 mg by mouth at bedtime. [provider]  Active   oxycodone -acetaminophen  (LYNOX) 2.5-300 MG per tablet [id number]  Take 1 tablet by mouth every 4 (four) hours as needed for pain. [provider]  Active   predniSONE  (DELTASONE ) 10 MG tablet [id number]  Take 1 tablet (10 mg total) by mouth daily with breakfast. Sherre Clapper, MD  Active   pregabalin  (LYRICA ) 75 MG capsule [id number]  Take 1 capsule (75 mg total) by mouth 3 (three) times daily. Teressa Harrie HERO, FNP  Active   propranolol ER (INDERAL LA) 60 MG 24 hr capsule [id number]  Take 60 mg by mouth daily. [provider]  Active   rOPINIRole  (REQUIP ) 1 MG tablet [id number]  Take 1 tablet by mouth daily. [provider]  Active Self  thiamine (VITAMIN B1) 100 MG tablet [id number]  Take by mouth. [provider]  Active Self  tiZANidine  (ZANAFLEX ) 4 MG tablet [id number]  Take 4 mg by mouth at bedtime. [provider]  Active Self  topiramate  (TOPAMAX ) 25 MG tablet [id number]  Take 1 tablet (25 mg total) by mouth 2 (two) times daily. Teressa Harrie HERO, FNP  Active   traZODone  (DESYREL ) 100 MG tablet [id number]  Take 100 mg by mouth at bedtime. [provider]  Active Self            Recommendation:   Continue Current Plan of Care  Follow Up Plan:   Patient has met all care management goals. Care Management case will be closed. Patient has been provided contact information should new needs arise.   Alm Armor, LCSW [facility], [facility] Licensed Clinical Social Worker Care Coordinator [phone]

## 2023-12-20 ENCOUNTER — Telehealth: Payer: Self-pay | Admitting: Neurology

## 2023-12-20 NOTE — Telephone Encounter (Signed)
 no auth required sent to [employer] [phone]

## 2023-12-21 ENCOUNTER — Other Ambulatory Visit

## 2023-12-21 DIAGNOSIS — R001 Bradycardia, unspecified: Secondary | ICD-10-CM | POA: Diagnosis not present

## 2023-12-21 NOTE — Progress Notes (Signed)
   12/21/2023 Name: Lauren Roth MRN: [id number] DOB: 02-Jul-1945  Chief Complaint  Patient presents with   Medication Management   Referred by PCP for poly pharmacy.   Duloxetine  had been an issue d/t [facility] staff not reading hand written Rx.   Is going to have daughter det up mail order rx service through [facility], sees her tomorrow.  Pill boxes/planners have not worked out for her. Shakes too bad, too tough to fill and might drop.  Medications verbalized by patient.   Ropinirole  1mg  at bedtime Tizanidine  4mg  at bedtime, every night Trazodone  1/2-1 tablet at night Oxycodone  10-325mg  qid Furosemide  20mg  am/pm Duloxetine  60mg  two capsules daily  Lyrica  75mg  tid Topirimate 25mg  AM/PM Clonidine  0.1mg  AM/PM  Losartan  50mg  daily in the morning Levothyroxine  50mcg daily Omeprazole  20mg  daily  Prednisone  10mg  daily - reports may be temporary  Donepezil  10mg  daily Bupropion  300mg  XL daily  Finasteride  5mg  daily  Buprenorphine  2mg  SL daily Oxybutynin 10mg  ER daily   Ferrix 150mg  caps  Aspirin  81mg  daily  Two calcium daily Vitamind d3 10,000 units daily   Reports was taken off of diltiazem     F/u plan: plans to see her dtr tomorrow 12/22/2023 and will talk with her about getting [facility] pharmacy delivery in place. Will reconsider packaging/mail order through [facility] in a couple weeks during our next call. It is unclear if oxybutynin is helping w/ incontinence, and d/t anticholinergic burden/risk, could consider stopping in the future as appropriate.   Future Appointments  Date Time Provider Department Center  12/28/2023 10:30 AM Prentiss Heddy HERO, RN [facility] None  01/04/2024  2:00 PM [facility]-PHARMACIST [facility] [place]  01/18/2024  2:20 PM Sherre Clapper, MD [facility] [place]  02/08/2024 10:00 AM [name], [name], DO [facility] None  03/21/2024  3:40 PM [facility] ANNUAL WELLNESS VISIT 1 [facility] [address]  10/29/2024  2:00 PM [facility]-DEXA 1 MCA-DG [place]     Lang Sieve,  PharmD, BCGP Clinical Pharmacist  [phone]

## 2023-12-22 DIAGNOSIS — I4719 Other supraventricular tachycardia: Secondary | ICD-10-CM | POA: Diagnosis not present

## 2023-12-22 DIAGNOSIS — R001 Bradycardia, unspecified: Secondary | ICD-10-CM | POA: Diagnosis not present

## 2023-12-22 DIAGNOSIS — T50905A Adverse effect of unspecified drugs, medicaments and biological substances, initial encounter: Secondary | ICD-10-CM | POA: Diagnosis not present

## 2023-12-28 ENCOUNTER — Telehealth: Payer: Self-pay

## 2023-12-28 DIAGNOSIS — H532 Diplopia: Secondary | ICD-10-CM | POA: Diagnosis not present

## 2023-12-28 NOTE — Patient Instructions (Signed)
 Lauren Roth - I am sorry I was unable to reach you today for our scheduled appointment. I work with Cox, Abigail, MD and am calling to support your healthcare needs. Please contact me at [phone] at your earliest convenience. I look forward to speaking with you soon.   Thank you,   Heddy Shutter, RN, MSN, BSN, CCM [place]  [facility], Population Health Case Manager Phone: [phone]

## 2023-12-29 ENCOUNTER — Telehealth: Payer: Self-pay

## 2023-12-29 ENCOUNTER — Other Ambulatory Visit: Payer: Self-pay

## 2023-12-29 DIAGNOSIS — R760 Raised antibody titer: Secondary | ICD-10-CM

## 2023-12-29 DIAGNOSIS — M0580 Other rheumatoid arthritis with rheumatoid factor of unspecified site: Secondary | ICD-10-CM

## 2023-12-29 DIAGNOSIS — M25551 Pain in right hip: Secondary | ICD-10-CM

## 2023-12-29 DIAGNOSIS — R269 Unspecified abnormalities of gait and mobility: Secondary | ICD-10-CM

## 2023-12-29 MED ORDER — PREDNISONE 10 MG PO TABS: 10.0000 mg | ORAL_TABLET | Freq: Every day | ORAL | 1 refills | Status: DC

## 2023-12-29 NOTE — Telephone Encounter (Signed)
 Copied from CRM [id number]. Topic: Clinical - Medication Question >> Dec 29, 2023 11:20 AM Lauren Roth wrote: Reason for CRM: Patient called.. wants to know if she can stay on predniSONE  (DELTASONE ) 10 MG tablet until 10/22 for her rheum appt?  Pls review for refill - can we do script?

## 2024-01-04 ENCOUNTER — Other Ambulatory Visit (INDEPENDENT_AMBULATORY_CARE_PROVIDER_SITE_OTHER)

## 2024-01-04 DIAGNOSIS — I10 Essential (primary) hypertension: Secondary | ICD-10-CM

## 2024-01-04 NOTE — Progress Notes (Signed)
   01/04/2024 Name: Lauren Roth MRN: [id number] DOB: 1945/04/27  Chief Complaint  Patient presents with   Medication Management   Polypharmacy   Referred by PCP for polypharmacy.   Duloxetine  had been an issue d/t [facility] staff not reading hand written Rx.   Is going to have daughter det up mail order rx service through [place], sees her tomorrow.  Pill boxes/planners have not worked out for her. Shakes too bad, too tough to fill and might drop.  Medications verbalized by patient.   Ropinirole  1mg  at bedtime Tizanidine  4mg  at bedtime, every night Trazodone  1/2-1 tablet at night Oxycodone  10-325mg  qid Furosemide  20mg  am/pm Duloxetine  60mg  two capsules daily  Lyrica  75mg  tid Topirimate 25mg  AM/PM Clonidine  0.1mg  AM/PM  Losartan  50mg  daily in the morning Levothyroxine  50mcg daily Omeprazole  20mg  daily  Prednisone  10mg  daily - reports may be temporary  Donepezil  10mg  daily Bupropion  300mg  XL daily  Finasteride  5mg  daily  Buprenorphine  2mg  SL daily Oxybutynin 10mg  ER daily   Ferrix 150mg  caps  Aspirin  81mg  daily  Two calcium daily Vitamind d3 10,000 units daily   Reports was taken off of diltiazem     F/u plan: plans to see her dtr tomorrow 12/22/2023 and will talk with her about getting [facility] delivery in place. Will reconsider packaging/mail order through [facility] in a couple weeks during our next call. It is unclear if oxybutynin is helping w/ incontinence, and d/t anticholinergic burden/risk, could consider stopping in the future as appropriate.  _______________  01/04/2024 update: - had not been able to set up mail delivery through [facility] d/t dtr, who helps coordinate meds, having to go to [facility] for her ablation.  - at this time is feeling well, feels she has a good handle on her medicationsis aware of packing/mail order services that are available to her without any additional expense - reviewed recommendation from Dr Ivin to cut down on  prednisone  to half tablet (5mg  total) daily. Is now aware and will start splitting the 10mg  tab in half.  - no f/u scheduled at this time, plans to talk with Dr Lauren Roth if any future pharmacy services are needed.   Future Appointments  Date Time Provider Department Center  01/05/2024 11:00 AM MCA-MRI [facility]-MRI [place]  01/18/2024  2:20 PM [name], MD Lauren Roth-CFO Lauren Roth [place]  02/08/2024 10:00 AM Lauren Roth, [name], DO CR-[facility] None  03/21/2024  3:40 PM [facility] ANNUAL WELLNESS VISIT 1 [facility] [address]  10/29/2024  2:00 PM MCA-DEXA 1 MCA-DG [place]     Lauren Roth, PharmD, BCGP Clinical Pharmacist  [phone]

## 2024-01-04 NOTE — Addendum Note (Signed)
 Addended by: PANDORA LANG ORN on: 01/04/2024 02:19 PM   Modules accepted: Level of Service

## 2024-01-05 ENCOUNTER — Ambulatory Visit (INDEPENDENT_AMBULATORY_CARE_PROVIDER_SITE_OTHER)
Admission: RE | Admit: 2024-01-05 | Discharge: 2024-01-05 | Disposition: A | Discharge status: Home / Self Care | Source: Ambulatory Visit | Attending: Neurology | Admitting: Neurology

## 2024-01-05 DIAGNOSIS — H532 Diplopia: Secondary | ICD-10-CM | POA: Diagnosis not present

## 2024-01-05 MED ORDER — GADOBUTROL 1 MMOL/ML IV SOLN
9.0000 mL | Freq: Once | INTRAVENOUS | Status: AC | PRN
  Administered 2024-01-05: 9 mL via INTRAVENOUS

## 2024-01-09 ENCOUNTER — Other Ambulatory Visit: Payer: Self-pay | Admitting: Family Medicine

## 2024-01-09 ENCOUNTER — Ambulatory Visit: Payer: Self-pay | Admitting: Neurology

## 2024-01-09 DIAGNOSIS — E039 Hypothyroidism, unspecified: Secondary | ICD-10-CM

## 2024-01-10 DIAGNOSIS — M47816 Spondylosis without myelopathy or radiculopathy, lumbar region: Secondary | ICD-10-CM | POA: Diagnosis not present

## 2024-01-10 DIAGNOSIS — G894 Chronic pain syndrome: Secondary | ICD-10-CM | POA: Diagnosis not present

## 2024-01-10 DIAGNOSIS — M533 Sacrococcygeal disorders, not elsewhere classified: Secondary | ICD-10-CM | POA: Diagnosis not present

## 2024-01-16 DIAGNOSIS — F339 Major depressive disorder, recurrent, unspecified: Secondary | ICD-10-CM | POA: Insufficient documentation

## 2024-01-16 DIAGNOSIS — F411 Generalized anxiety disorder: Secondary | ICD-10-CM | POA: Diagnosis not present

## 2024-01-16 DIAGNOSIS — F330 Major depressive disorder, recurrent, mild: Secondary | ICD-10-CM | POA: Diagnosis not present

## 2024-01-18 ENCOUNTER — Ambulatory Visit: Admitting: Family Medicine

## 2024-01-19 ENCOUNTER — Other Ambulatory Visit: Payer: Self-pay | Admitting: Family Medicine

## 2024-01-19 DIAGNOSIS — K219 Gastro-esophageal reflux disease without esophagitis: Secondary | ICD-10-CM

## 2024-01-23 DIAGNOSIS — H532 Diplopia: Secondary | ICD-10-CM | POA: Diagnosis not present

## 2024-01-23 DIAGNOSIS — H524 Presbyopia: Secondary | ICD-10-CM | POA: Diagnosis not present

## 2024-01-24 ENCOUNTER — Other Ambulatory Visit: Payer: Self-pay | Admitting: Family Medicine

## 2024-01-24 ENCOUNTER — Telehealth: Payer: Self-pay | Admitting: Neurology

## 2024-01-24 MED ORDER — POLYSACCHARIDE IRON COMPLEX 150 MG PO CAPS: 150.0000 mg | ORAL_CAPSULE | Freq: Every day | ORAL | 1 refills | Status: DC

## 2024-01-24 NOTE — Telephone Encounter (Signed)
 Patient LVM at 11:41 am got a message MRI came back negative. Did not know if need to come in for anything else or are we done. Would like a call back.

## 2024-01-24 NOTE — Telephone Encounter (Signed)
 Copied from CRM #[id number]. Topic: Clinical - Medication Refill >> Jan 24, 2024 10:26 AM Tysheama G wrote: Medication: iron polysaccharides (NIFEREX) 150 MG capsule  Has the patient contacted their pharmacy? Yes (Agent: If no, request that the patient contact the pharmacy for the refill. If patient does not wish to contact the pharmacy document the reason why and proceed with request.) (Agent: If yes, when and what did the pharmacy advise?)  This is the patient's preferred pharmacy:  [facility] [address], [place] - [address] [place] [address] Phone: [phone] Fax: [phone]  Is this the correct pharmacy for this prescription? Yes If no, delete pharmacy and type the correct one.   Has the prescription been filled recently? No  Is the patient out of the medication? No  Has the patient been seen for an appointment in the last year OR does the patient have an upcoming appointment? Yes  Can we respond through MyChart? Yes  Agent: Please be advised that Rx refills may take up to 3 business days. We ask that you follow-up with your pharmacy.

## 2024-01-24 NOTE — Telephone Encounter (Signed)
 Call to patient, she states that she did see the ophthalmologist and will try the prism  glasses. IF not helpful they said she could be a surgical candidate. I advised no follow was indicated unless symptoms fail to get better. She is also going for a consult with rheumatology to se if there is any correlation. She was appreciative of call

## 2024-02-03 ENCOUNTER — Telehealth: Payer: Self-pay

## 2024-02-03 NOTE — Telephone Encounter (Signed)
 Copied from CRM [id number]. Topic: Clinical - Medication Question >> Feb 03, 2024 12:10 PM Hadassah PARAS wrote: Reason for CRM: Pt was prescribed new iron supplement  (NIFEREX) 150 MG capsule   And states it is expensive. She is requesting to be re prescribed the previous iron supplement Ferrex 150 that she was taken before. Please advise mobile [phone]

## 2024-02-06 ENCOUNTER — Other Ambulatory Visit: Payer: Self-pay | Admitting: Family Medicine

## 2024-02-06 MED ORDER — POLYSACCHARIDE IRON COMPLEX 150 MG PO CAPS: 150.0000 mg | ORAL_CAPSULE | Freq: Every day | ORAL | 0 refills | Status: AC

## 2024-02-08 ENCOUNTER — Ambulatory Visit

## 2024-02-08 VITALS — BP 176/72 | HR 60 | Temp 97.6°F | Resp 14 | Ht 63.25 in | Wt 201.2 lb

## 2024-02-08 DIAGNOSIS — M797 Fibromyalgia: Secondary | ICD-10-CM | POA: Diagnosis not present

## 2024-02-08 DIAGNOSIS — R7689 Other specified abnormal immunological findings in serum: Secondary | ICD-10-CM

## 2024-02-08 DIAGNOSIS — G894 Chronic pain syndrome: Secondary | ICD-10-CM | POA: Diagnosis not present

## 2024-02-08 NOTE — Progress Notes (Addendum)
 Office Visit Note  Patient: Lauren Roth             Date of Birth: 03/13/1946           MRN: [id number]             PCP: Lauren Clapper, MD Referring: Lauren Clapper, MD Visit Date: 02/08/2024 Occupation: Data Unavailable  Subjective:  New Patient (Initial Visit) (Joint Pain)  Discussed the use of AI scribe software for clinical note transcription with the patient, who gave verbal consent to proceed.  History of Present Illness Lauren Roth is a 78 year old female with fibromyalgia and osteoarthritis who presents with hx of acute worsening of pain syndromes a few month prior. She states was referred by her primary care doctor for evaluation of her joint pain and to rule out any serious underlying conditions.  Approximately three months ago, she experienced a sudden and severe increase in whole body pain, which was unusual for her baseline fibromyalgia and osteoarthritis pain. She woke up with intense pain in her entire body from the top of her head to her feet; affecting every part of her body. She describes this particular severe episode of pain referred as the fire,' characterized by intense, burning pain throughout her entire body, affecting her muscles, bones, and nerves. This episode was unresponsive to pain medications and lasted for about a month and a half. She was started on a low dose of prednisone  by her family doctor, which has provided some relief over the past month. This episode led to a fall and a subsequent vertebral fracture. She was taken to the emergency room and was hospitalized, where she received pain management.  Following the fracture, she was admitted to a rehabilitation facility for three weeks, where she engaged in physical therapy. However, she reported that physical activity often made her pain worse, and she would feel worse the next day after physical therapy. Despite rehabilitation efforts, she continues to experience significant pain at the site of  the fracture and in her knees, which she attributes to weather changes.  Her past medical history includes fibromyalgia, which she describes as a severe case, with symptoms exacerbated by weather changes and barometric pressure. She also has a history of osteoarthritis, particularly affecting her hands, with visible changes and swelling noted in her fingers.  She has a history of borderline diabetes, with recent lab results indicating elevated levels. She has experienced complications such as a toe ulcer, which required surgical intervention. She is currently on pregabalin , prescribed by her pain clinic, and oxycodone  for pain management.  She has a family history of rheumatoid arthritis, as her father was affected by the condition. She also reports symptoms of dry mouth, dry eyes, and double vision, which have been investigated with MRIs of her brain and orbits, all returning negative results.  She reports that she is currently back to her baseline chronic pain that she has had for many years.    Activities of Daily Living:  Patient reports morning stiffness for 60-120 minutes.   Patient Reports nocturnal pain.  Difficulty dressing/grooming: Reports Difficulty climbing stairs: Reports Difficulty getting out of chair: Reports Difficulty using hands for taps, buttons, cutlery, and/or writing: Reports  Review of Systems  Constitutional:  Positive for fatigue.  HENT:  Positive for mouth dryness. Negative for mouth sores.   Eyes:  Positive for dryness.  Respiratory:  Negative for shortness of breath.   Cardiovascular:  Negative for chest pain and palpitations.  Gastrointestinal:  Positive for constipation. Negative for blood in stool and diarrhea.  Endocrine: Positive for increased urination.  Genitourinary:  Negative for involuntary urination.  Musculoskeletal:  Positive for joint pain, gait problem, joint pain, joint swelling, myalgias, muscle weakness, morning stiffness, muscle tenderness  and myalgias.  Skin:  Negative for color change, rash, hair loss and sensitivity to sunlight.  Allergic/Immunologic: Negative for susceptible to infections.  Neurological:  Positive for dizziness and headaches.  Hematological:  Positive for swollen glands.  Psychiatric/Behavioral:  Positive for sleep disturbance. Negative for depressed mood. The patient is not nervous/anxious.     PMFS History:  Patient Active Problem List   Diagnosis Date Noted   Binocular vision disorder with diplopia 12/09/2023   Generalized weakness 12/09/2023   Abnormal antinuclear antibody titer 11/30/2023   Cough 11/21/2023   Wheezing 11/21/2023   Acute pain of both hips 11/21/2023   Pain in both hands 11/21/2023   Bradycardia, drug induced 11/07/2023   Severe obesity (BMI 35.0-39.9) with comorbidity (HCC) 11/07/2023   Bradycardia 11/04/2023   Migraine with aura and with status migrainosus, not intractable 11/03/2023   Fracture of second lumbar vertebra (HCC) 10/13/2023   Depressive disorder 10/13/2023   Gastroesophageal reflux disease without esophagitis 10/13/2023   Chronic pain 10/13/2023   Amnesia 10/13/2023   Hypokalemia 10/11/2023   Compression fracture of L2 (HCC) 10/10/2023   Osteoarthritis of carpometacarpal (CMC) joint of thumb 09/19/2023   Bilateral thumb pain 09/19/2023   Cellulitis of second toe, left 09/08/2023   Diet-controlled diabetes mellitus (HCC) 09/08/2023   Chronic diarrhea 09/01/2023   Chronic thumb pain, right 09/01/2023   Rib fracture 08/22/2023   Simple chronic bronchitis (HCC) 08/16/2023   Obesity, morbid (HCC) 08/16/2023   Lumbar interspinous bursitis 08/03/2023   Candidal intertrigo 07/18/2023   Abnormality of gait and mobility 06/29/2023   Hyperkalemia 05/25/2023   Lumbar spondylosis 04/21/2023   Right hand pain 04/03/2023   Encounter for osteoporosis screening in asymptomatic postmenopausal patient 02/06/2023   Other chest pain 02/06/2023   Prediabetes 02/05/2023    Mixed hyperlipidemia 02/05/2023   Corns of multiple toes 01/11/2023   Toe pain, left 01/11/2023   Toe pain, right 01/11/2023   GAD (generalized anxiety disorder) 12/08/2022   SVT (supraventricular tachycardia) 08/25/2022   Tremor 12/29/2021   Obesity (BMI 30.0-34.9) 10/13/2021   Chronic pain of right knee 06/12/2021   Chronic heart failure with preserved ejection fraction (HCC) 05/08/2021   Heart failure (HCC) 05/08/2021   Lymphedema 02/10/2021   Neuropathy 03/10/2020   Thyroid  disease    Left hip pain 05/24/2019   Moderate aortic regurgitation 05/21/2019   Pre-operative cardiovascular examination 11/25/2017   Essential thrombocytosis (HCC) 11/25/2017   Palpitations 01/28/2017   Shortness of breath on exertion 01/28/2017   SI (sacroiliac) pain 05/10/2016   Primary osteoarthritis of right knee 10/06/2014   Closed fracture of left tibia 10/06/2014   History of total knee arthroplasty 10/06/2014   Thrombocythemia, essential (HCC) 09/12/2013   Uterine cancer (HCC) 07/30/2013   TMJ (dislocation of temporomandibular joint) 07/30/2013   Pain in thoracic spine 07/30/2013   Chronic pain syndrome 07/30/2013   Arthritis 07/30/2013   Disorder of sacrum 07/30/2013   Polyarthritis with positive rheumatoid factor (HCC) 07/30/2013   Cervical post-laminectomy syndrome 07/30/2013   Pain in soft tissues of limb 06/20/2012   Thoracic spondylosis 11/20/2010   Lumbosacral spondylosis 10/09/2010   Restless legs syndrome 08/25/2010   Medication management 08/25/2010   Fibromyalgia 08/25/2010   Hypothyroidism 01/16/2009  DEPRESSION 01/16/2009   Essential hypertension 01/16/2009   Allergic rhinitis 01/16/2009   GERD 01/16/2009   Menopausal and postmenopausal disorder 01/16/2009   DEGENERATIVE DISC DISEASE, LUMBOSACRAL SPINE 01/16/2009   DIAPHORESIS 01/16/2009   Memory loss 01/16/2009   Edema 01/16/2009   Diaphoresis 01/16/2009   Depression 01/16/2009    Past Medical History:  Diagnosis  Date   Allergic rhinitis 01/16/2009   Qualifier: Diagnosis of  By: Kassie MD, Alyce DELENA Deal of this note might be different from the original. Overview:  Qualifier: Diagnosis of  By: Kassie MD, Alyce A   Arthritis    Chronic migraine w/o aura, not intractable, w/o stat migr    Chronic pain of right knee 06/12/2021   Chronic pain syndrome 07/30/2013   DEGENERATIVE DISC DISEASE, LUMBOSACRAL SPINE 01/16/2009   Qualifier: Diagnosis of  By: Kassie MD, Sean A    Depression    DIAPHORESIS 01/16/2009   Qualifier: Diagnosis of  By: Kassie MD, Alyce A    Edema 01/16/2009   Qualifier: Diagnosis of  By: Kassie MD, Alyce DELENA   Formatting of this note might be different from the original. Overview:  Qualifier: Diagnosis of  By: Kassie MD, Sean A   Essential hypertension 01/28/2017   Essential thrombocytosis (HCC) 11/25/2017   Fibromyalgia    GERD 01/16/2009   Qualifier: Diagnosis of  By: Kassie MD, Sean A    Heart failure (HCC) 05/08/2021   HYPERTENSION 01/16/2009   Qualifier: Diagnosis of  By: Kassie MD, Sean A    Hypokalemia 01/16/2009   Qualifier: Diagnosis of  By: Kassie MD, Alyce DELENA   Formatting of this note might be different from the original. Overview:  Qualifier: Diagnosis of  By: Kassie MD, Sean A   Left hip pain 05/24/2019   Lumbosacral spondylosis 10/09/2010   Lymphedema 02/10/2021   Memory loss 01/16/2009   Qualifier: Diagnosis of  By: Kassie MD, Alyce DELENA   Formatting of this note might be different from the original. Overview:  Qualifier: Diagnosis of  By: Kassie MD, Sean A   Menopausal and postmenopausal disorder 01/16/2009   Qualifier: Diagnosis of  By: Kassie MD, Alyce DELENA   Formatting of this note might be different from the original. Overview:  Qualifier: Diagnosis of  By: Kassie MD, Sean A   Moderate aortic regurgitation 05/21/2019   Neuropathic pain 09/25/2020   Neuropathy    Obesity (BMI 30.0-34.9) 10/13/2021   Pain in soft tissues of limb 06/20/2012   Formatting  of this note might be different from the original. Overview:  IMPRESSION: Left knee pain Formatting of this note might be different from the original. IMPRESSION: Left knee pain   Pain in thoracic spine 07/30/2013   Palpitations 01/28/2017   Primary osteoarthritis of right knee 10/06/2014   Restless legs syndrome 08/25/2010   Shortness of breath on exertion 01/28/2017   SI (sacroiliac) pain 05/10/2016   Thoracic spondylosis 11/20/2010   Overview:  Overview:  IMPRESSION: Thoracic radiculopathy Overview:  IMPRESSION: Thoracic radiculopathy  Formatting of this note might be different from the original. Overview:  IMPRESSION: Thoracic radiculopathy Formatting of this note might be different from the original. IMPRESSION: Thoracic radiculopathy   Thrombocythemia, essential (HCC) 09/12/2013   Thyroid  disease    TMJ (dislocation of temporomandibular joint) 07/30/2013   Tremor 12/29/2021   Unspecified hypothyroidism 01/16/2009   Qualifier: Diagnosis of  By: Kassie MD, Sean A    Uterine cancer (HCC) 07/30/2013    Family History  Problem Relation Age  of Onset   Deep vein thrombosis Father    Heart disease Father    Heart failure Father    Alzheimer's disease Father    Heart disease Brother    Pulmonary embolism Brother    Heart disease Brother    Heart attack Brother    Birth defects Son    Drug abuse Son    Mental illness Son    Bipolar disorder Son    Past Surgical History:  Procedure Laterality Date   APPENDECTOMY     CERVICAL DISC SURGERY     CHOLECYSTECTOMY     FRACTURE SURGERY Left    Left arm has a metal plate   KNEE SURGERY Bilateral    TKR   ROBOTIC ASSISTED LAP VAGINAL HYSTERECTOMY     did not take ovaries. had uterine cancer.   Social History   Tobacco Use   Smoking status: Former    Passive exposure: Past   Smokeless tobacco: Never   Tobacco comments:    at age 24  Vaping Use   Vaping status: Never Used  Substance Use Topics   Alcohol use: No   Drug use: No    Social History   Social History Narrative   Right handed   Lives at home with husband     Immunization History  Administered Date(s) Administered   Fluad Trivalent(High Dose 65+) 02/03/2023   INFLUENZA, HIGH DOSE SEASONAL PF 04/09/2021   Moderna Covid-19 Fall Seasonal Vaccine 50yrs & older 04/07/2022   Moderna Sars-Covid-2 Vaccination 05/14/2019, 06/11/2019, 01/04/2020   PPD Test 10/14/2023, 10/24/2023   Pfizer(Comirnaty)Fall Seasonal Vaccine 12 years and older 02/03/2023   Pneumococcal Polysaccharide-23 04/09/2021, 02/22/2023   Respiratory Syncytial Virus Vaccine,Recomb Aduvanted(Arexvy) 10/17/2023   Tdap 09/30/2019     Objective: Vital Signs: BP (!) 176/72 (BP Location: Left Arm, Patient Position: Sitting, Cuff Size: Small)   Pulse 60   Temp 97.6 F (36.4 C)   Resp 14   Ht 5' 3.25 (1.607 m)   Wt 201 lb 3.2 oz (91.3 kg)   LMP  (LMP Unknown)   BMI 35.36 kg/m    Physical Exam Vitals and nursing note reviewed.  HENT:     Head: Normocephalic and atraumatic.     Nose: Nose normal.  Eyes:     Conjunctiva/sclera: Conjunctivae normal.     Pupils: Pupils are equal, round, and reactive to light.  Cardiovascular:     Rate and Rhythm: Normal rate and regular rhythm.     Heart sounds: Normal heart sounds.  Pulmonary:     Effort: Pulmonary effort is normal.     Breath sounds: Normal breath sounds.  Musculoskeletal:     Comments: Diffuse tenderness to palpation of large proximal muscle groups, b/l shins. No significant tenderness to palpation of hand joints. Tenderness to palpation of b/l knees.  Skin:    General: Skin is warm and dry.  Neurological:     Mental Status: She is alert. Mental status is at baseline.  Psychiatric:        Mood and Affect: Mood normal.        Behavior: Behavior normal.      Investigation: No additional findings.  Imaging: No results found.  Recent Labs: Lab Results  Component Value Date   WBC 7.3 11/02/2023   HGB 13.4 11/02/2023    PLT 369 11/02/2023   NA 140 11/02/2023   K 4.4 11/02/2023   CL 99 11/02/2023   CO2 26 11/02/2023   GLUCOSE 124 (H) 11/02/2023  BUN 13 11/02/2023   CREATININE 0.89 11/02/2023   BILITOT 0.2 11/02/2023   ALKPHOS 96 11/02/2023   AST 17 11/02/2023   ALT 15 11/02/2023   PROT 7.2 11/02/2023   ALBUMIN 4.5 11/02/2023   CALCIUM 9.8 11/02/2023    Speciality Comments: No specialty comments available.  Procedures:  No procedures performed Allergies: Cottonseed oil and Codeine sulfate   Assessment / Plan:     Visit Diagnoses:  Chronic pain syndrome Fibromyalgia  The patient notes constant fatigue and widespread pain, trouble concentrating (when reading a newspaper or watching TV), memory deficits with mental fog, and nonrejuvenating sleep. The patient has a history of widespread pain lasting more than 3 months. The patient's history is not concerning for inflammatory joint disease, inflammatory myositis, or PMR.  The patient's physical exam has no evidence of inflammatory joint disease. This is consistent with a diagnosis of fibromyalgia. Fibromyalgia is a chronic pain disorder, thought to be a disorder of pain regulation, and often classified as a form of central sensitization. Fibromyalgia is characterized by widespread musculoskeletal pain, accompanied by fatigue, cognitive disturbances, psychiatric symptoms, headaches, paresthesia's, and multiple somatic symptoms. Fibromyalgia is not an inflammatory or autoimmune condition, and immunosuppressive therapy does not treat fibromyalgia. Treatment is both nonpharmacological and pharmacological in nature, and should focus on sleep hygiene, graded exercise programs, pharmacologic therapy (options including TCA (amitriptyline), SNRIs (duloxetine , milnacipran), flexeril, gabapentin/lyrica , naltrexone, TENS), pyschosocial therapy i.e. CBT. The patient will likely benefit from multidisciplinary care by PCP, PT, integrative medicine, psychiatry, and pain  management. These recommendations will be sent to PCP.  Positive ANA (antinuclear antibody) Patient with positive ANA and abnormal RNP value. At this time, this lab finding is of unclear significance. As discussed with patient, her acute increase in diffuse body pain is not associated with a positive ANA, and it would not explain her presentation. Given that patient is completely back to her baseline, I am not sure what caused her acute worsening of symptoms. Suspect possibly a viral insult, which would also explain abnormal laboratory findings.   Discussed in depth with patient that ANA tests can be positive in the normal population, autoimmune thyroid  disease (Graves' disease, Hashimoto's thyroiditis etc.), or infection. ANA can be positive in the healthy population at the following rates: ANA 1:40: 20%-30%, ANA 1:80: 10%-15%, ANA 1:160: 5%, ANA 1:320: 3% positive, healthy relative of an SLE patient: 5%-25% positive (usually low titers), and elderly (age >70 years): up to 70% positive at ANA titer 1:40.   Rheumatoid factor positive Patient with mildly elevated RF. Given her symptoms are very non-inflammatory in nature and she has a negative CCP, patient's positive RF is very likely clinically insignificant at this time and likely just secondary to her age. Discussed with patient that a rheumatoid factor is a nonspecific finding and can be seen in a number of rheumatologic and non-rheumatologic conditions. Indolent infections, hepatitis B and C are also associated with elevated RF. Rheumatoid factor can also be positive in normal individuals at a low titer (less than 50 IU) w/ a prevalence as high as 14% in apparently healthy people aged 36-95. Patient verbalizes understanding. For completion, will rule out Hepatitis B surface antigen, Hepatitis B core antibody, IgM, Hepatitis C antibody:   Orders: Orders Placed This Encounter  Procedures   Hepatitis B surface antigen   Hepatitis B core antibody, IgM    Hepatitis C antibody   No orders of the defined types were placed in this encounter.   I personally spent a  total of 45 minutes in the care of the patient today including preparing to see the patient, getting/reviewing separately obtained history, performing a medically appropriate exam/evaluation, counseling and educating, placing orders, and documenting clinical information in the EHR.   Follow-Up Instructions: Return if symptoms worsen or fail to improve.   [name] Claw, DO  Note - This record has been created using Animal nutritionist.  Chart creation errors have been sought, but may not always  have been located. Such creation errors do not reflect on  the standard of medical care.

## 2024-02-09 LAB — HEPATITIS B SURFACE ANTIGEN: Hepatitis B Surface Ag: NONREACTIVE

## 2024-02-09 LAB — HEPATITIS C ANTIBODY: Hepatitis C Ab: NONREACTIVE

## 2024-02-09 LAB — HEPATITIS B CORE ANTIBODY, IGM: Hep B C IgM: NONREACTIVE

## 2024-02-13 ENCOUNTER — Ambulatory Visit: Payer: Self-pay

## 2024-02-14 ENCOUNTER — Other Ambulatory Visit: Payer: Self-pay | Admitting: Family Medicine

## 2024-02-20 ENCOUNTER — Other Ambulatory Visit: Payer: Self-pay | Admitting: Family Medicine

## 2024-02-20 DIAGNOSIS — G43101 Migraine with aura, not intractable, with status migrainosus: Secondary | ICD-10-CM

## 2024-03-19 ENCOUNTER — Other Ambulatory Visit: Payer: Self-pay | Admitting: Family Medicine

## 2024-03-19 DIAGNOSIS — M797 Fibromyalgia: Secondary | ICD-10-CM

## 2024-03-20 ENCOUNTER — Ambulatory Visit

## 2024-03-22 ENCOUNTER — Ambulatory Visit: Payer: Self-pay

## 2024-03-22 NOTE — Telephone Encounter (Signed)
 FYI Only or Action Required?: FYI only for provider: appointment scheduled on 03/26/24 and advised UC for worsening rash symptoms.  Patient was last seen in primary care on 12/09/2023 by Sirivol, Mamatha, MD.  Called Nurse Triage reporting Joint Pain.  Symptoms began several weeks ago.  Interventions attempted: OTC medications: Eucerin cream.  Symptoms are: unchanged.  Triage Disposition: No disposition on file.  Patient/caregiver understands and will follow disposition?:  Reason for Disposition  [1] Localized area of skin darkening or thickening on lower legs or ankles AND [2] has NOT been evaluated by a doctor (or NP/PA)  Answer Assessment - Initial Assessment Questions Patient has been using Eucerin Itching Relief cream. Patient thinks its from compression socks / wraps, patient compliant with washing them regularly. Patient reports doing a lot of scratching and has mentioned breaking skin one time scratching so much.  Patient states going to rheumatologist states she was negative for rheumatoid arthritis and she needs to see a neurologist. Patient expressed frustration about seeing all these specialist who spend 5 minutes with her and then passes her off to a new one.  Patient reports its hard to concentrate, she has trouble staying awake, she wants to sleep all the time, more forgetful, reports not remembering grandchildren's names.   1. APPEARANCE of RASH: What does the rash look like? (e.g., blisters, dry flaky skin, red spots, redness, sores)     Red bumps  2. LOCATION: Where is the rash located?      Below the knee, does not go down to ankles  3. NUMBER: How many spots are there?      Too many to count  4. SIZE: How big are the spots? (e.g., inches, cm; or compare to size of pinhead, tip of pen, eraser, pea)      Tip of a pen  5. ONSET: When did the rash start?      Few weeks ago, went away and came back worse a few days ago  6. ITCHING: Does the rash itch?  If Yes, ask: How bad is the itch?  (Scale 0-10; or none, mild, moderate, severe)     Severe  7. PAIN: Does the rash hurt? If Yes, ask: How bad is the pain?  (Scale 0-10; or none, mild, moderate, severe)     Denies  Answer Assessment - Initial Assessment Questions Patient   1. REASON FOR CALL: What is your main concern right now?     Joint pain, hands, hips, knees.  2. ONSET: When did the *No Answer* start?     *No Answer* 3. SEVERITY: How bad is the *No Answer*?     *No Answer* 4. FUNCTIONAL IMPAIRMENT: How have things been going for you overall? Have you had more difficulty than usual doing your normal daily activities? (e.g., self-care, school, work, interactions)     Yes, can't move well, can't only walk a short distance with walker and then has to sit down  5. RELIEVING AND AGGRAVATING FACTORS: What makes it better or worse? (e.g., certain activities, rest)     *No Answer* 6. FEVER: Do you have a fever?     *No Answer* 7. OTHER SYMPTOMS: Do you have any other new symptoms?     Rash on legs, 8. TREATMENTS AND RESPONSE: What have you done so far to try to make this better? What medicines have you used?     *No Answer* 9. PREGNANCY: Is there any chance you are pregnant? When was your last menstrual period?     *  No Answer*  Protocols used: No Guideline Available-A-AH, Rash or Redness - Localized-A-AH  Copied from CRM #[id number]. Topic: Clinical - Red Word Triage >> Mar 22, 2024 12:32 PM Antwanette L wrote: Red Word that prompted transfer to Nurse Triage: Patient reports pain in all joints, swelling, concentration issues, sleeps a lot, difficulty walking and standing, rash on both legs. Patient was evaluated by a rheumatologist, who dismissed her concerns and did not believe she has arthritis. Rheumatologist referred her to a neurologist.Patient has already seen the neurologist.

## 2024-03-26 ENCOUNTER — Encounter: Payer: Self-pay | Admitting: Family Medicine

## 2024-03-26 ENCOUNTER — Ambulatory Visit: Admitting: Family Medicine

## 2024-03-26 VITALS — BP 128/62 | HR 70 | Temp 97.8°F | Ht 63.25 in | Wt 200.0 lb

## 2024-03-26 DIAGNOSIS — Z23 Encounter for immunization: Secondary | ICD-10-CM

## 2024-03-26 DIAGNOSIS — I10 Essential (primary) hypertension: Secondary | ICD-10-CM | POA: Diagnosis not present

## 2024-03-26 DIAGNOSIS — M797 Fibromyalgia: Secondary | ICD-10-CM

## 2024-03-26 DIAGNOSIS — E782 Mixed hyperlipidemia: Secondary | ICD-10-CM

## 2024-03-26 DIAGNOSIS — R7303 Prediabetes: Secondary | ICD-10-CM | POA: Diagnosis not present

## 2024-03-26 DIAGNOSIS — R413 Other amnesia: Secondary | ICD-10-CM

## 2024-03-26 DIAGNOSIS — G894 Chronic pain syndrome: Secondary | ICD-10-CM

## 2024-03-26 DIAGNOSIS — G8929 Other chronic pain: Secondary | ICD-10-CM

## 2024-03-26 DIAGNOSIS — E039 Hypothyroidism, unspecified: Secondary | ICD-10-CM

## 2024-03-26 DIAGNOSIS — R404 Transient alteration of awareness: Secondary | ICD-10-CM

## 2024-03-26 MED ORDER — LOSARTAN POTASSIUM 100 MG PO TABS: 100.0000 mg | ORAL_TABLET | Freq: Every day | ORAL | 0 refills | Status: AC

## 2024-03-26 MED ORDER — GEMTESA 75 MG PO TABS: 1.0000 | ORAL_TABLET | Freq: Every day | ORAL | 0 refills | Status: AC

## 2024-03-26 NOTE — Patient Instructions (Addendum)
  VISIT SUMMARY: Today, we addressed your severe hip pain, chronic pain management, and several other health concerns. We have scheduled further evaluations and made adjustments to your medications to better manage your symptoms.  YOUR PLAN: CHRONIC PAIN SYNDROME WITH RIGHT HIP PAIN AND FIBROMYALGIA: You have severe right hip pain that affects your mobility. Previous x-rays showed mild arthritis. -We have ordered an MRI of your hip. -Continue your current pain management regimen. -Follow up with the pain clinic next week for further evaluation and possible injections. -Defer to pain management if wishes to increase lyrica . I am making numerous medication changes to severe somnolence, as such I do not wish to increase the lyrica  at the same time   ALTERED MENTAL STATUS WITH TRANSIENT ALTERATION OF AWARENESS AND AMNESIA: You have been experiencing episodes of zoning out and memory lapses, possibly related to previous head trauma or absence seizures. -We have ordered an MRI of your brain. -You are referred to neurology for further evaluation. - I am concerned about the possibility of absence seizures  - Discontinue oxybutynin.  DEPRESSION: Your mood is stable except for depression related to chronic pain. -Continue your current medications, Wellbutrin  and Cymbalta .  INSOMNIA: You have chronic insomnia with excessive daytime sleepiness, possibly related to your medication regimen. -Reduce trazodone  to 50 mg at bedtime for one week, then discontinue. -Discontinue clonidine . -Discontinue tizanidine .  ESSENTIAL HYPERTENSION: Your high blood pressure is managed with propranolol and clonidine . -Increase losartan  to 100 mg daily. -Discontinue clonidine .  EDEMA: You have chronic leg swelling, which has worsened recently due to heat and fluid retention. -Continue taking Lasix  20 mg twice daily.  RESTLESS LEGS SYNDROME: You have restless legs syndrome. -Continue taking Requip  1 mg before bed.  SABRA  TREMOR: You have a tremor that is managed with propranolol. -Continue taking propranolol.  MIXED HYPERLIPIDEMIA: You have high cholesterol levels and have had issues with statins in the past. -We have ordered blood work to assess your lipid levels.  PREDIABETES: You have concerns about potential progression to diabetes. -We have ordered blood work to assess your glucose levels.  URGE INCONTINENCE: STOP OXYBUTYNIN. IF URGE INCONTINENCE RECURS, START GEMTESA  75 MG ONCE DAILY SAMPLES.   GENERAL HEALTH MAINTENANCE: You are due for your flu shot and COVID booster, and you need an annual wellness visit. -You received your flu shot and COVID booster today. -We have scheduled your annual wellness visit.                      Contains text generated by [employer].                                 Contains text generated by [employer].

## 2024-03-26 NOTE — Assessment & Plan Note (Addendum)
 Managed with propranolol and clonidine . Clonidine  to be discontinued due to sedative effects. - Increased losartan  to 100 mg daily. - Discontinued clonidine . Orders:   CBC with Differential/Platelet   Comprehensive metabolic panel with GFR

## 2024-03-26 NOTE — Assessment & Plan Note (Addendum)
 Chronic pain syndrome with right hip pain and fibromyalgia Severe right hip pain affecting mobility. Previous x-rays showed mild arthritis. Differential includes bursitis and possible back issues. Current pain management includes buprenorphine , oxycodone , Lyrica , and tizanidine . Pain clinic appointment scheduled for further evaluation and possible injections. - Ordered MRI of the right hip. - Continue current pain management regimen. - Follow up with pain clinic next week.

## 2024-03-26 NOTE — Assessment & Plan Note (Addendum)
  Orders:   T4, free   TSH

## 2024-03-26 NOTE — Assessment & Plan Note (Addendum)
  Orders:   Lipid panel

## 2024-03-26 NOTE — Progress Notes (Unsigned)
 Subjective:  Patient ID: Lauren Roth, female    DOB: 08-06-1945  Age: 78 y.o. MRN: [id number]  Chief Complaint  Patient presents with   Pain all over    HPI: Discussed the use of AI scribe software for clinical note transcription with the patient, who gave verbal consent to proceed.  History of Present Illness Lauren Roth is a 78 year old female with chronic pain who presents with worsening hip pain. She is accompanied by her husband, Lauren Roth.  Hip pain - Severe, excruciating bilateral hip pain, right greater than left - Pain significantly impairs ambulation and daily activities - Described as 'killing me' - Not alleviated by current pain medications, including oxycodone  and buprenorphine  - History of mild hip arthritis confirmed by x-ray on August 13th  Chronic pain management - Current regimen: buprenorphine  2 mg sublingual once daily, oxycodone  four times daily, Lyrica  75 mg three times daily, tizanidine  4 mg at bedtime, trazodone  100 mg at bedtime - Buprenorphine  provides relief for back pain but not hip pain - Oxycodone  ineffective for hip pain  Gait instability and falls - Frequent falls with history of injuries, including rib fractures - Dizziness and lack of balance attributed to leg issues - Ongoing knee pain and instability, possibly related to hip and leg problems  Visual disturbances - Double vision, corrected with glasses  Sleep disturbances and daytime somnolence - Significant sleep disturbances with excessive daytime sleepiness and difficulty staying awake - Episodes of zoning out and memory lapses - History of sleep apnea, not using CPAP - Attributes sleepiness to medications  Lower extremity edema - Leg swelling, worsened by heat - Fluid oozing from legs - Difficulty wearing leg wraps - On Lasix  20 mg twice daily for swelling  Gastrointestinal symptoms - Stuffy nose - Alternating diarrhea and constipation  Cognitive impairment -  Worsening memory and concentration - Concerned about recent decline in cognitive function  Restless legs syndrome - On Requip  for restless legs  Mood disorders - On Wellbutrin , Cymbalta , and trazodone  for mood and sleep  Tremor and blood pressure management - On propranolol for tremor and blood pressure control  Surgical history - History of knee surgeries, including two knee replacements - Last knee replacement complicated by postoperative issues  Oncologic history - History of uterine cancer, successfully treated  Peripheral ulcer history - History of toe ulcer, surgically treated  Constitutional and cardiac symptoms - No recent fevers, chills, or chest pain       03/26/2024    4:13 PM 12/16/2023    2:25 PM 12/15/2023    9:46 AM 12/13/2023    3:53 PM 12/09/2023   10:09 AM  Depression screen PHQ 2/9  Decreased Interest 3 0 0 3 0  Down, Depressed, Hopeless 1 0 0 3 0  PHQ - 2 Score 4 0 0 6 0  Altered sleeping 3   2 0  Tired, decreased energy 3   3 0  Change in appetite 2   0 0  Feeling bad or failure about yourself  2   0 0  Trouble concentrating 3   3 0  Moving slowly or fidgety/restless 3   3 0  Suicidal thoughts 0   0 0  PHQ-9 Score 20   17  0   Difficult doing work/chores Extremely dIfficult   Very difficult Not difficult at all     Data saved with a previous flowsheet row definition        03/26/2024    4:13  PM  Fall Risk   Falls in the past year? 1  Number falls in past yr: 1  Injury with Fall? 1  Risk for fall due to : History of fall(s)  Follow up Falls evaluation completed    Patient Care Team: Sherre Clapper, MD as PCP - General (Family Medicine) Launie, Alexa Shad, MD as Referring Physician (Cardiology) Myrick Lauren PARAS., Lauren Roth (Podiatry) Silva Elveria BIRCH (Inactive) as Referring Physician Powell Slater RIGGERS as Physician Assistant (Physician Assistant) Prentiss Heddy HERO, RN as [facility] Care Management   Review of Systems  Current Outpatient  Medications on File Prior to Visit  Medication Sig Dispense Refill   acetaminophen  (TYLENOL ) 650 MG CR tablet Take 650 mg by mouth every 8 (eight) hours as needed for pain.     albuterol  (VENTOLIN  HFA) 108 (90 Base) MCG/ACT inhaler Inhale 2 puffs into the lungs every 6 (six) hours as needed for wheezing or shortness of breath. 8 g 2   aspirin  81 MG chewable tablet Chew 81 mg by mouth daily.     Biotin 10 MG TABS Take by mouth.     buprenorphine  (SUBUTEX ) 2 MG SUBL SL tablet Place 2 mg under the tongue daily.     buPROPion  (WELLBUTRIN  XL) 300 MG 24 hr tablet Take 1 tablet by mouth daily.     Calcium Carb-Cholecalciferol 500-10 MG-MCG TABS Take by mouth.     Cholecalciferol (VITAMIN D3) 250 MCG (10000 UT) TABS Take 1 tablet by mouth daily.     diclofenac Sodium (VOLTAREN) 1 % GEL Apply 0.5 g topically 4 (four) times daily.     diltiazem  (CARDIZEM  CD) 120 MG 24 hr capsule Take 120 mg by mouth daily.     donepezil  (ARICEPT ) 10 MG tablet Take 1 tablet by mouth once daily 90 tablet 0   DULoxetine  (CYMBALTA ) 60 MG capsule Take 60 mg by mouth 2 (two) times daily.     ergocalciferol (VITAMIN D2) 1.25 MG (50000 UT) capsule Take 50,000 Units by mouth once a week.     finasteride  (PROSCAR ) 5 MG tablet Take 5 mg by mouth daily.     fluticasone  (FLONASE ) 50 MCG/ACT nasal spray Place 1 spray into both nostrils daily as needed for allergies or rhinitis. 15.8 mL 1   furosemide  (LASIX ) 20 MG tablet Take 1 tablet (20 mg total) by mouth 2 (two) times daily. 180 tablet 0   iron  polysaccharides (FERREX 150) 150 MG capsule Take 1 capsule (150 mg total) by mouth daily. 90 capsule 0   levothyroxine  (SYNTHROID ) 50 MCG tablet Take 1 tablet by mouth once daily 90 tablet 0   lidocaine (LIDODERM) 5 % Place onto the skin.     loratadine (CLARITIN) 10 MG tablet Take 10 mg by mouth daily.     Misc Natural Products (FIBER 7 PO) Take by mouth.     mupirocin ointment (BACTROBAN) 2 % 1 Application 2 (two) times daily.      nitroGLYCERIN  (NITROSTAT ) 0.4 MG SL tablet Place 1 tablet (0.4 mg total) under the tongue as needed for chest pain. 25 tablet 3   omeprazole  (PRILOSEC) 20 MG capsule Take 1 capsule by mouth once daily 90 capsule 0   oxyCODONE -acetaminophen  (PERCOCET) 10-325 MG tablet Take 1 tablet by mouth 4 (four) times daily as needed.     pregabalin  (LYRICA ) 75 MG capsule TAKE 1 CAPSULE BY MOUTH THREE TIMES DAILY 90 capsule 2   propranolol ER (INDERAL LA) 60 MG 24 hr capsule Take 60 mg by mouth daily.  rOPINIRole  (REQUIP ) 1 MG tablet Take 1 tablet by mouth daily.     thiamine (VITAMIN B1) 100 MG tablet Take by mouth.     topiramate  (TOPAMAX ) 25 MG tablet Take 1 tablet by mouth twice daily 180 tablet 0   No current facility-administered medications on file prior to visit.   Past Medical History:  Diagnosis Date   Allergic rhinitis 01/16/2009   Qualifier: Diagnosis of  By: Kassie MD, Alyce DELENA Deal of this note might be different from the original. Overview:  Qualifier: Diagnosis of  By: Kassie MD, Alyce A   Arthritis    Chronic migraine w/o aura, not intractable, w/o stat migr    Chronic pain of right knee 06/12/2021   Chronic pain syndrome 07/30/2013   DEGENERATIVE DISC DISEASE, LUMBOSACRAL SPINE 01/16/2009   Qualifier: Diagnosis of  By: Kassie MD, Alyce A    Depression    DIAPHORESIS 01/16/2009   Qualifier: Diagnosis of  By: Kassie MD, Alyce A    Edema 01/16/2009   Qualifier: Diagnosis of  By: Kassie MD, Alyce DELENA   Formatting of this note might be different from the original. Overview:  Qualifier: Diagnosis of  By: Kassie MD, Sean A   Essential hypertension 01/28/2017   Essential thrombocytosis (HCC) 11/25/2017   Fibromyalgia    GERD 01/16/2009   Qualifier: Diagnosis of  By: Kassie MD, Sean A    Heart failure (HCC) 05/08/2021   HYPERTENSION 01/16/2009   Qualifier: Diagnosis of  By: Kassie MD, Sean A    Hypokalemia 01/16/2009   Qualifier: Diagnosis of  By: Kassie MD, Alyce DELENA    Formatting of this note might be different from the original. Overview:  Qualifier: Diagnosis of  By: Kassie MD, Sean A   Left hip pain 05/24/2019   Lumbosacral spondylosis 10/09/2010   Lymphedema 02/10/2021   Memory loss 01/16/2009   Qualifier: Diagnosis of  By: Kassie MD, Alyce DELENA   Formatting of this note might be different from the original. Overview:  Qualifier: Diagnosis of  By: Kassie MD, Sean A   Menopausal and postmenopausal disorder 01/16/2009   Qualifier: Diagnosis of  By: Kassie MD, Alyce DELENA   Formatting of this note might be different from the original. Overview:  Qualifier: Diagnosis of  By: Kassie MD, Sean A   Moderate aortic regurgitation 05/21/2019   Neuropathic pain 09/25/2020   Neuropathy    Obesity (BMI 30.0-34.9) 10/13/2021   Pain in soft tissues of limb 06/20/2012   Formatting of this note might be different from the original. Overview:  IMPRESSION: Left knee pain Formatting of this note might be different from the original. IMPRESSION: Left knee pain   Pain in thoracic spine 07/30/2013   Palpitations 01/28/2017   Primary osteoarthritis of right knee 10/06/2014   Restless legs syndrome 08/25/2010   Shortness of breath on exertion 01/28/2017   SI (sacroiliac) pain 05/10/2016   Thoracic spondylosis 11/20/2010   Overview:  Overview:  IMPRESSION: Thoracic radiculopathy Overview:  IMPRESSION: Thoracic radiculopathy  Formatting of this note might be different from the original. Overview:  IMPRESSION: Thoracic radiculopathy Formatting of this note might be different from the original. IMPRESSION: Thoracic radiculopathy   Thrombocythemia, essential (HCC) 09/12/2013   Thyroid  disease    TMJ (dislocation of temporomandibular joint) 07/30/2013   Tremor 12/29/2021   Unspecified hypothyroidism 01/16/2009   Qualifier: Diagnosis of  By: Kassie MD, Sean A    Uterine cancer (HCC) 07/30/2013   Past Surgical History:  Procedure Laterality Date  APPENDECTOMY     CERVICAL DISC  SURGERY     CHOLECYSTECTOMY     FRACTURE SURGERY Left    Left arm has a metal plate   KNEE SURGERY Bilateral    TKR   ROBOTIC ASSISTED LAP VAGINAL HYSTERECTOMY     did not take ovaries. had uterine cancer.    Family History  Problem Relation Age of Onset   Deep vein thrombosis Father    Heart disease Father    Heart failure Father    Alzheimer's disease Father    Heart disease Brother    Pulmonary embolism Brother    Heart disease Brother    Heart attack Brother    Birth defects Son    Drug abuse Son    Mental illness Son    Bipolar disorder Son    Social History   Socioeconomic History   Marital status: Married    Spouse name: Not on file   Number of children: Not on file   Years of education: Not on file   Highest education level: GED or equivalent  Occupational History   Not on file  Tobacco Use   Smoking status: Former    Passive exposure: Past   Smokeless tobacco: Never   Tobacco comments:    at age 20  Vaping Use   Vaping status: Never Used  Substance and Sexual Activity   Alcohol use: No   Drug use: No   Sexual activity: Not on file  Other Topics Concern   Not on file  Social History Narrative   Right handed   Lives at home with husband   Social Drivers of Health   Tobacco Use: Medium Risk (03/26/2024)   Patient History    Smoking Tobacco Use: Former    Smokeless Tobacco Use: Never    Passive Exposure: Past  Physicist, Medical Strain: Low Risk (03/21/2023)   Overall Financial Resource Strain (CARDIA)    Difficulty of Paying Living Expenses: Not hard at all  Food Insecurity: No Food Insecurity (12/16/2023)   Epic    Worried About Radiation Protection Practitioner of Food in the Last Year: Never true    Ran Out of Food in the Last Year: Never true  Transportation Needs: No Transportation Needs (12/16/2023)   Epic    Lack of Transportation (Medical): No    Lack of Transportation (Non-Medical): No  Physical Activity: Inactive (03/21/2023)   Exercise Vital Sign    Days  of Exercise per Week: 0 days    Minutes of Exercise per Session: 0 min  Stress: No Stress Concern Present (03/21/2023)   [employer] of Occupational Health - Occupational Stress Questionnaire    Feeling of Stress : Not at all  Social Connections: Socially Integrated (10/11/2023)   Social Connection and Isolation Panel    Frequency of Communication with Friends and Family: More than three times a week    Frequency of Social Gatherings with Friends and Family: Twice a week    Attends Religious Services: More than 4 times per year    Active Member of Clubs or Organizations: Yes    Attends Banker Meetings: More than 4 times per year    Marital Status: Married  Depression (PHQ2-9): High Risk (03/26/2024)   Depression (PHQ2-9)    PHQ-2 Score: 20  Alcohol Screen: Low Risk (11/03/2023)   Alcohol Screen    Last Alcohol Screening Score (AUDIT): 0  Housing: High Risk (12/16/2023)   Epic    Unable to Pay for Housing  in the Last Year: No    Number of Times Moved in the Last Year: 0    Homeless in the Last Year: Yes  Utilities: Not At Risk (12/16/2023)   Epic    Threatened with loss of utilities: No  Health Literacy: Adequate Health Literacy (03/21/2023)   B1300 Health Literacy    Frequency of need for help with medical instructions: Never    Objective:  BP 128/62 (BP Location: Left Arm, Patient Position: Sitting)   Pulse 70   Temp 97.8 F (36.6 C) (Temporal)   Ht 5' 3.25 (1.607 m)   Wt 200 lb (90.7 kg)   LMP  (LMP Unknown)   SpO2 96%   BMI 35.15 kg/m      03/26/2024    2:57 PM 02/08/2024   10:34 AM 02/08/2024    9:53 AM  BP/Weight  Systolic BP 128 176 175  Diastolic BP 62 72 71  Wt. (Lbs) 200  201.2  BMI 35.15 kg/m2  35.36 kg/m2    Physical Exam Vitals reviewed.  Constitutional:      Appearance: Normal appearance. She is obese.  Neck:     Vascular: No carotid bruit.  Cardiovascular:     Rate and Rhythm: Normal rate and regular rhythm.     Heart sounds:  Normal heart sounds.  Pulmonary:     Effort: Pulmonary effort is normal. No respiratory distress.     Breath sounds: Normal breath sounds.  Abdominal:     General: Abdomen is flat. Bowel sounds are normal.     Palpations: Abdomen is soft.     Tenderness: There is no abdominal tenderness.  Musculoskeletal:        General: Tenderness (FM TRIGGER POINTS POSITIVE. DISCOMFORT WITH FLEXION OF RIGHT HIP. NO SPECIFIC BURSA TENDERNESS.) present.  Neurological:     Mental Status: She is alert and oriented to person, place, and time.     Cranial Nerves: No cranial nerve deficit.     Sensory: No sensory deficit.     Gait: Gait abnormal (ANTALGIC GAIT.).  Psychiatric:        Mood and Affect: Mood normal.        Behavior: Behavior normal.     {Perform Simple Foot Exam  Perform Detailed exam:1} {Insert foot Exam (Optional):30965}   Lab Results  Component Value Date   WBC 6.2 03/26/2024   HGB 13.3 03/26/2024   HCT 40.2 03/26/2024   PLT 321 03/26/2024   GLUCOSE 117 (H) 03/26/2024   CHOL 183 03/26/2024   TRIG 171 (H) 03/26/2024   HDL 45 03/26/2024   LDLCALC 108 (H) 03/26/2024   ALT 17 03/26/2024   AST 20 03/26/2024   NA 138 03/26/2024   K 4.2 03/26/2024   CL 102 03/26/2024   CREATININE 0.94 03/26/2024   BUN 8 03/26/2024   CO2 24 03/26/2024   TSH 1.660 03/26/2024   HGBA1C 5.3 03/26/2024    Results for orders placed or performed in visit on 03/26/24  CBC with Differential/Platelet   Collection Time: 03/26/24  4:07 PM  Result Value Ref Range   WBC 6.2 3.4 - 10.8 x10E3/uL   RBC 4.34 3.77 - 5.28 x10E6/uL   Hemoglobin 13.3 11.1 - 15.9 g/dL   Hematocrit 59.7 65.9 - 46.6 %   MCV 93 79 - 97 fL   MCH 30.6 26.6 - 33.0 pg   MCHC 33.1 31.5 - 35.7 g/dL   RDW 87.6 88.2 - 84.5 %   Platelets 321 150 - 450 x10E3/uL  Neutrophils 42 Not Estab. %   Lymphs 45 Not Estab. %   Monocytes 8 Not Estab. %   Eos 4 Not Estab. %   Basos 1 Not Estab. %   Neutrophils Absolute 2.6 1.4 - 7.0 x10E3/uL    Lymphocytes Absolute 2.8 0.7 - 3.1 x10E3/uL   Monocytes Absolute 0.5 0.1 - 0.9 x10E3/uL   EOS (ABSOLUTE) 0.2 0.0 - 0.4 x10E3/uL   Basophils Absolute 0.0 0.0 - 0.2 x10E3/uL   Immature Granulocytes 0 Not Estab. %   Immature Grans (Abs) 0.0 0.0 - 0.1 x10E3/uL  Comprehensive metabolic panel with GFR   Collection Time: 03/26/24  4:07 PM  Result Value Ref Range   Glucose 117 (H) 70 - 99 mg/dL   BUN 8 8 - 27 mg/dL   Creatinine, Ser 9.05 0.57 - 1.00 mg/dL   eGFR 62 >40 fO/fpw/8.26   BUN/Creatinine Ratio 9 (L) 12 - 28   Sodium 138 134 - 144 mmol/L   Potassium 4.2 3.5 - 5.2 mmol/L   Chloride 102 96 - 106 mmol/L   CO2 24 20 - 29 mmol/L   Calcium 9.8 8.7 - 10.3 mg/dL   Total Protein 7.1 6.0 - 8.5 g/dL   Albumin 4.5 3.8 - 4.8 g/dL   Globulin, Total 2.6 1.5 - 4.5 g/dL   Bilirubin Total 0.3 0.0 - 1.2 mg/dL   Alkaline Phosphatase 75 49 - 135 IU/L   AST 20 0 - 40 IU/L   ALT 17 0 - 32 IU/L  Lipid panel   Collection Time: 03/26/24  4:07 PM  Result Value Ref Range   Cholesterol, Total 183 100 - 199 mg/dL   Triglycerides 828 (H) 0 - 149 mg/dL   HDL 45 >60 mg/dL   VLDL Cholesterol Cal 30 5 - 40 mg/dL   LDL Chol Calc (NIH) 891 (H) 0 - 99 mg/dL   Chol/HDL Ratio 4.1 0.0 - 4.4 ratio  Hemoglobin A1c   Collection Time: 03/26/24  4:07 PM  Result Value Ref Range   Hgb A1c MFr Bld 5.3 4.8 - 5.6 %   Est. average glucose Bld gHb Est-mCnc 105 mg/dL  T4, free   Collection Time: 03/26/24  4:07 PM  Result Value Ref Range   Free T4 0.88 0.82 - 1.77 ng/dL  TSH   Collection Time: 03/26/24  4:07 PM  Result Value Ref Range   TSH 1.660 0.450 - 4.500 uIU/mL  .  Assessment & Plan:   Assessment & Plan Essential hypertension Managed with propranolol and clonidine . Clonidine  to be discontinued due to sedative effects. - Increased losartan  to 100 mg daily. - Discontinued clonidine . Orders:   CBC with Differential/Platelet   Comprehensive metabolic panel with GFR  Acquired hypothyroidism Previously well  controlled Continue Synthroid  at current dose  Recheck TSH and adjust Synthroid  as indicated   Orders:   T4, free   TSH   Fibromyalgia Chronic pain syndrome with right hip pain and fibromyalgia Severe right hip pain affecting mobility. Previous x-rays showed mild arthritis. Differential includes bursitis and possible back issues. Current pain management includes buprenorphine , oxycodone , Lyrica , and tizanidine . Pain clinic appointment scheduled for further evaluation and possible injections. - Ordered MRI of the hip. - Continue current pain management regimen. - Follow up with pain clinic next week.    Chronic pain syndrome Severe right hip pain affecting mobility. Previous x-rays showed mild arthritis. Differential includes bursitis and possible back issues. Current pain management includes buprenorphine , oxycodone , Lyrica , and tizanidine . Pain clinic appointment scheduled for further  evaluation and possible injections. - Ordered MRI of the hip. - Continue current pain management regimen. - Follow up with pain clinic next week.    Chronic right hip pain Severe right hip pain affecting mobility. Previous x-rays showed mild arthritis. Differential includes bursitis and possible back issues. Current pain management includes buprenorphine , oxycodone , Lyrica , and tizanidine . Pain clinic appointment scheduled for further evaluation and possible injections. - Ordered MRI of the hip. - Continue current pain management regimen. - Follow up with pain clinic next week. Orders:   MR HIP RIGHT WO CONTRAST; Future  Transient alteration of awareness Altered mental status with transient alteration of awareness and amnesia Episodes of zoning out and amnesia. Possible absence seizures. Previous neurology evaluation suggested brain damage. Recent falls with head trauma noted. - Ordered MRI of the brain. - Referred to neurology for further evaluation. Orders:   MR Brain Wo Contrast;  Future  Prediabetes Concerns about potential progression to diabetes. Previous hemoglobin A1c was normal. - Ordered blood work to assess glucose levels. Orders:   Hemoglobin A1c  Mixed hyperlipidemia Previous intolerance to statins. Dietary management in place. - Ordered blood work to assess lipid levels. Orders:   Lipid panel  Memory loss Altered mental status with transient alteration of awareness and amnesia Episodes of zoning out and amnesia. Possible absence seizures. Previous neurology evaluation suggested brain damage. Recent falls with head trauma noted. - Ordered MRI of the brain. - Referred to neurology for further evaluation. Orders:   B12 and Folate Panel   Methylmalonic acid, serum  Depressive disorder Exacerbated by chronic pain. Mood stable except for pain-related depression. Current medications include Wellbutrin  and Cymbalta .    Primary insomnia Chronic insomnia with excessive daytime sleepiness. Excessive sleepiness possibly related to medication regimen. - Cut trazodone  to 50 mg at bedtime for one week, then discontinue. - Increased losartan  to 100 mg daily. - Discontinued clonidine . - Discontinued tizanidine .    Pedal edema Chronic edema managed with Lasix . Recent exacerbation due to heat and fluid retention. - Continue Lasix  20 mg twice daily.    Restless legs syndrome Managed with Requip . - Continue Requip .    Tremor Tremor Managed with propranolol. - Continue propranolol.    Encounter for immunization  Orders:   Flu vaccine HIGH DOSE PF(Fluzone  Trivalent)  Encounter for immunization  Orders:   Pfizer Comirnaty Covid-19 Vaccine 66yrs & older   Body mass index is 35.15 kg/m.   Meds ordered this encounter  Medications   losartan  (COZAAR ) 100 MG tablet    Sig: Take 1 tablet (100 mg total) by mouth daily.    Dispense:  90 tablet    Refill:  0   Vibegron  (GEMTESA ) 75 MG TABS    Sig: Take 1 tablet (75 mg total) by mouth daily.     Dispense:  28 tablet    Refill:  0    Orders Placed This Encounter  Procedures   MR HIP RIGHT WO CONTRAST   MR Brain Wo Contrast   Flu vaccine HIGH DOSE PF(Fluzone  Trivalent)   Pfizer Comirnaty Covid-19 Vaccine 61yrs & older   CBC with Differential/Platelet   Comprehensive metabolic panel with GFR   Lipid panel   Hemoglobin A1c   T4, free   TSH   B12 and Folate Panel   Methylmalonic acid, serum     I,Marla I Leal-Borjas,acting as a scribe for Abigail Free, MD.,have documented all relevant documentation on the behalf of Abigail Free, MD,as directed by  Abigail Free, MD while in the  presence of Abigail Free, MD.   Follow-up: Return in about 4 weeks (around 04/23/2024) for chronic follow up.  An After Visit Summary was printed and given to the patient.  Abigail Free, MD [facility] [phone]

## 2024-03-26 NOTE — Assessment & Plan Note (Addendum)
  Orders:   Hemoglobin A1c

## 2024-03-26 NOTE — Assessment & Plan Note (Addendum)
 Altered mental status with transient alteration of awareness and amnesia Episodes of zoning out and amnesia. Possible absence seizures. Previous neurology evaluation suggested brain damage. Recent falls with head trauma noted. - Ordered MRI of the brain. - Referred to neurology for further evaluation. Orders:   B12 and Folate Panel   Methylmalonic acid, serum

## 2024-03-26 NOTE — Assessment & Plan Note (Deleted)
 Severe right hip pain affecting mobility. Previous x-rays showed mild arthritis. Differential includes bursitis and possible back issues. Current pain management includes buprenorphine , oxycodone , Lyrica , and tizanidine . Pain clinic appointment scheduled for further evaluation and possible injections. - Ordered MRI of the hip. - Continue current pain management regimen. - Follow up with pain clinic next week.

## 2024-03-27 ENCOUNTER — Ambulatory Visit: Payer: Self-pay | Admitting: Family Medicine

## 2024-03-27 LAB — CBC WITH DIFFERENTIAL/PLATELET
Basophils Absolute: 0 x10E3/uL (ref 0.0–0.2)
Basos: 1 %
EOS (ABSOLUTE): 0.2 x10E3/uL (ref 0.0–0.4)
Eos: 4 %
Hematocrit: 40.2 % (ref 34.0–46.6)
Hemoglobin: 13.3 g/dL (ref 11.1–15.9)
Immature Grans (Abs): 0 x10E3/uL (ref 0.0–0.1)
Immature Granulocytes: 0 %
Lymphocytes Absolute: 2.8 x10E3/uL (ref 0.7–3.1)
Lymphs: 45 %
MCH: 30.6 pg (ref 26.6–33.0)
MCHC: 33.1 g/dL (ref 31.5–35.7)
MCV: 93 fL (ref 79–97)
Monocytes Absolute: 0.5 x10E3/uL (ref 0.1–0.9)
Monocytes: 8 %
Neutrophils Absolute: 2.6 x10E3/uL (ref 1.4–7.0)
Neutrophils: 42 %
Platelets: 321 x10E3/uL (ref 150–450)
RBC: 4.34 x10E6/uL (ref 3.77–5.28)
RDW: 12.3 % (ref 11.7–15.4)
WBC: 6.2 x10E3/uL (ref 3.4–10.8)

## 2024-03-27 LAB — COMPREHENSIVE METABOLIC PANEL WITH GFR
ALT: 17 IU/L (ref 0–32)
AST: 20 IU/L (ref 0–40)
Albumin: 4.5 g/dL (ref 3.8–4.8)
Alkaline Phosphatase: 75 IU/L (ref 49–135)
BUN/Creatinine Ratio: 9 — ABNORMAL LOW (ref 12–28)
BUN: 8 mg/dL (ref 8–27)
Bilirubin Total: 0.3 mg/dL (ref 0.0–1.2)
CO2: 24 mmol/L (ref 20–29)
Calcium: 9.8 mg/dL (ref 8.7–10.3)
Chloride: 102 mmol/L (ref 96–106)
Creatinine, Ser: 0.94 mg/dL (ref 0.57–1.00)
Globulin, Total: 2.6 g/dL (ref 1.5–4.5)
Glucose: 117 mg/dL — ABNORMAL HIGH (ref 70–99)
Potassium: 4.2 mmol/L (ref 3.5–5.2)
Sodium: 138 mmol/L (ref 134–144)
Total Protein: 7.1 g/dL (ref 6.0–8.5)
eGFR: 62 mL/min/1.73 (ref >59)

## 2024-03-27 LAB — HEMOGLOBIN A1C
Est. average glucose Bld gHb Est-mCnc: 105 mg/dL
Hgb A1c MFr Bld: 5.3 % (ref 4.8–5.6)

## 2024-03-27 LAB — LIPID PANEL
Chol/HDL Ratio: 4.1 ratio (ref 0.0–4.4)
Cholesterol, Total: 183 mg/dL (ref 100–199)
HDL: 45 mg/dL (ref >39)
LDL Chol Calc (NIH): 108 mg/dL — ABNORMAL HIGH (ref 0–99)
Triglycerides: 171 mg/dL — ABNORMAL HIGH (ref 0–149)
VLDL Cholesterol Cal: 30 mg/dL (ref 5–40)

## 2024-03-27 LAB — TSH: TSH: 1.66 u[IU]/mL (ref 0.450–4.500)

## 2024-03-27 LAB — T4, FREE: Free T4: 0.88 ng/dL (ref 0.82–1.77)

## 2024-03-31 DIAGNOSIS — R404 Transient alteration of awareness: Secondary | ICD-10-CM | POA: Insufficient documentation

## 2024-03-31 DIAGNOSIS — F5101 Primary insomnia: Secondary | ICD-10-CM | POA: Insufficient documentation

## 2024-03-31 LAB — METHYLMALONIC ACID, SERUM: Methylmalonic Acid: 215 nmol/L (ref 0–378)

## 2024-03-31 LAB — B12 AND FOLATE PANEL
Folate: >20 ng/mL (ref >3.0)
Vitamin B-12: 883 pg/mL (ref 232–1245)

## 2024-03-31 NOTE — Assessment & Plan Note (Signed)
 Chronic insomnia with excessive daytime sleepiness. Excessive sleepiness possibly related to medication regimen. - Cut trazodone  to 50 mg at bedtime for one week, then discontinue. - Increased losartan  to 100 mg daily. - Discontinued clonidine . - Discontinued tizanidine .

## 2024-03-31 NOTE — Assessment & Plan Note (Signed)
 Chronic edema managed with Lasix . Recent exacerbation due to heat and fluid retention. - Continue Lasix  20 mg twice daily.

## 2024-03-31 NOTE — Assessment & Plan Note (Signed)
 Exacerbated by chronic pain. Mood stable except for pain-related depression. Current medications include Wellbutrin  and Cymbalta .

## 2024-03-31 NOTE — Assessment & Plan Note (Signed)
 Tremor Managed with propranolol. - Continue propranolol.

## 2024-03-31 NOTE — Assessment & Plan Note (Addendum)
 Severe right hip pain affecting mobility. Previous x-rays showed mild arthritis. Differential includes bursitis and possible back issues. Current pain management includes buprenorphine , oxycodone , Lyrica , and tizanidine . Pain clinic appointment scheduled for further evaluation and possible injections. - Ordered MRI of the hip. - Continue current pain management regimen. - Follow up with pain clinic next week. Orders:   MR HIP RIGHT WO CONTRAST; Future

## 2024-03-31 NOTE — Assessment & Plan Note (Addendum)
 Altered mental status with transient alteration of awareness and amnesia Episodes of zoning out and amnesia. Possible absence seizures. Previous neurology evaluation suggested brain damage. Recent falls with head trauma noted. - Ordered MRI of the brain. - Referred to neurology for further evaluation. Orders:   MR Brain Wo Contrast; Future

## 2024-03-31 NOTE — Assessment & Plan Note (Signed)
 Managed with Requip . - Continue Requip .

## 2024-04-17 ENCOUNTER — Ambulatory Visit (HOSPITAL_BASED_OUTPATIENT_CLINIC_OR_DEPARTMENT_OTHER)
Admission: RE | Admit: 2024-04-17 | Discharge: 2024-04-17 | Disposition: A | Discharge status: Home / Self Care | Source: Ambulatory Visit | Attending: Family Medicine | Admitting: Family Medicine

## 2024-04-17 ENCOUNTER — Other Ambulatory Visit: Payer: Self-pay | Admitting: Family Medicine

## 2024-04-17 DIAGNOSIS — R404 Transient alteration of awareness: Secondary | ICD-10-CM | POA: Diagnosis present

## 2024-04-17 DIAGNOSIS — G8929 Other chronic pain: Secondary | ICD-10-CM

## 2024-04-17 DIAGNOSIS — K219 Gastro-esophageal reflux disease without esophagitis: Secondary | ICD-10-CM

## 2024-04-17 DIAGNOSIS — M25551 Pain in right hip: Secondary | ICD-10-CM | POA: Diagnosis present

## 2024-04-21 ENCOUNTER — Other Ambulatory Visit: Payer: Self-pay | Admitting: Family Medicine

## 2024-04-21 DIAGNOSIS — E039 Hypothyroidism, unspecified: Secondary | ICD-10-CM

## 2024-04-25 ENCOUNTER — Ambulatory Visit: Admitting: Family Medicine

## 2024-04-30 ENCOUNTER — Ambulatory Visit: Payer: Self-pay

## 2024-04-30 ENCOUNTER — Other Ambulatory Visit: Payer: Self-pay | Admitting: Family Medicine

## 2024-04-30 DIAGNOSIS — G8929 Other chronic pain: Secondary | ICD-10-CM

## 2024-04-30 NOTE — Telephone Encounter (Signed)
 FYI Only or Action Required?: FYI only for provider: appointment scheduled on 05/02/24.  Patient was last seen in primary care on 03/26/2024 by Sherre Clapper, MD.  Called Nurse Triage reporting Eye Pain.  Symptoms began several weeks ago.  Interventions attempted: OTC medications: eye drops.  Symptoms are: gradually worsening.  Triage Disposition: See Physician Within 24 Hours  Patient/caregiver understands and will follow disposition?: Yes     Message from [facility] sent at 04/30/2024 12:34 PM EST  Reason for Triage: Infection in both eyes: itching, when she bends over there is pressure, blood shot, painful - worsening      Reason for Disposition  Eye pain present > 24 hours  Answer Assessment - Initial Assessment Questions Returned call to f/u on symptoms. Pt states that she has had pain and itching in bilateral eyes and pain in cheekbones. Pt denies fever, change in vision or rash/drainage. Pt states symptoms have been present x 2 weeks. Pt has tried otc eye drops without relief and starting to cause pain. Pt states eyes could be dry as she has been taken off several drying medications. Pt states does have double vision that is not new, wears prism  glasses for correction, no sensitivity to light. Discussed d/t irritation with eye drops and possible dryness, pt could try otc saline to aid moisture to eyes. Pt voiced understanding. Appointment scheduled for evaluation. Patient agrees with plan of care, and will call back if anything changes, or if symptoms worsen.       1. ONSET: When did the pain start? (e.g., minutes, hours, days)     2 weeks ago   2. TIMING: Does the pain come and go, or has it been constant since it started? (e.g., constant, intermittent, fleeting)     Constant   3. SEVERITY: How bad is the pain?  (Scale 1-10; mild, moderate or severe)     4  4. LOCATION: Where does it hurt?  (e.g., eyelid, eye, cheekbone)     Bilateral eyes and cheekbones    5. CAUSE: What do you think is causing the pain?     Unsure   6. VISION: Do you have blurred vision or changes in your vision?      Nothing new; pt reports wearing prism  glasses for double vision   7. EYE DISCHARGE: Is there any discharge (pus) from the eye(s)?  If Yes, ask: What color is it?      None   8. FEVER: Do you have a fever? If Yes, ask: What is it, how was it measured, and when did it start?      No   9. OTHER SYMPTOMS: Do you have any other symptoms? (e.g., headache, nasal discharge, facial rash)     Pressure in eyes and itching  Protocols used: Eye Pain and Other Symptoms-A-AH

## 2024-05-02 ENCOUNTER — Encounter: Payer: Self-pay | Admitting: Family Medicine

## 2024-05-02 ENCOUNTER — Ambulatory Visit (INDEPENDENT_AMBULATORY_CARE_PROVIDER_SITE_OTHER): Admitting: Family Medicine

## 2024-05-02 VITALS — BP 160/70 | HR 77 | Temp 97.3°F | Resp 16 | Ht 63.0 in | Wt 199.0 lb

## 2024-05-02 DIAGNOSIS — J3089 Other allergic rhinitis: Secondary | ICD-10-CM

## 2024-05-02 DIAGNOSIS — J0100 Acute maxillary sinusitis, unspecified: Secondary | ICD-10-CM | POA: Diagnosis not present

## 2024-05-02 DIAGNOSIS — M19041 Primary osteoarthritis, right hand: Secondary | ICD-10-CM

## 2024-05-02 DIAGNOSIS — B372 Candidiasis of skin and nail: Secondary | ICD-10-CM

## 2024-05-02 DIAGNOSIS — G8929 Other chronic pain: Secondary | ICD-10-CM | POA: Diagnosis not present

## 2024-05-02 DIAGNOSIS — M25551 Pain in right hip: Secondary | ICD-10-CM

## 2024-05-02 DIAGNOSIS — M19042 Primary osteoarthritis, left hand: Secondary | ICD-10-CM

## 2024-05-02 DIAGNOSIS — H1013 Acute atopic conjunctivitis, bilateral: Secondary | ICD-10-CM

## 2024-05-02 MED ORDER — CEFDINIR 300 MG PO CAPS: 300.0000 mg | ORAL_CAPSULE | Freq: Two times a day (BID) | ORAL | 0 refills | Status: AC

## 2024-05-02 MED ORDER — AZELASTINE HCL 0.05 % OP SOLN: 1.0000 [drp] | Freq: Two times a day (BID) | OPHTHALMIC | 12 refills | Status: AC

## 2024-05-02 MED ORDER — NYSTATIN 100000 UNIT/GM EX POWD: 1.0000 | Freq: Three times a day (TID) | CUTANEOUS | 5 refills | Status: AC

## 2024-05-02 NOTE — Progress Notes (Signed)
 "  Acute Office Visit  Subjective:    Patient ID: Lauren Roth, female    DOB: October 28, 1945, 79 y.o.   MRN: [id number]  Chief Complaint  Patient presents with   Eye Pain    Discussed the use of AI scribe software for clinical note transcription with the patient, who gave verbal consent to proceed.  History of Present Illness Lauren Roth is a 79 year old female who presents with itchy, red eyes and sinus congestion. She is accompanied by Mr. Arley, who appears to be a family member or caregiver.  Ocular symptoms - Itchy eyes for approximately two and a half weeks - Sensation of 'sandpaper' in the eyes - Redness and occasional 'yellowy' appearance of the eyes - Over-the-counter medicated drops from [employer] caused burning - Saline drops provided some relief without burning  Sinonasal symptoms - Facial soreness and stuffiness - Clear nasal drainage - Symptoms persistent since previous visit - Daily use of Flonase  nasal spray, two sprays in each nostril - Previously used allergy pills but discontinued due to dry mouth  Visual disturbance - History of double vision - Wears prism  glasses for the past five to ten months  Otologic symptoms - Ear discomfort, particularly in one ear - No signs of ear infection  Intertriginous rash - Recurrent rash under the breast and abdominal folds - Rash described as red and raw - No recent use of topical treatments  Musculoskeletal pain and mobility impairment - Severe hip pain affecting mobility - Requires assistance to lift leg into the car - Difficulty walking - History of three knee replacements - ABNORMAL MRI OF HIP. NEEDS TO SEE ORTHOPEDICS.   Unintentional weight loss - Significant weight loss of approximately fifty pounds    Past Medical History:  Diagnosis Date   Allergic rhinitis 01/16/2009   Qualifier: Diagnosis of  By: Kassie MD, Alyce DELENA Deal of this note might be different from the original.  Overview:  Qualifier: Diagnosis of  By: Kassie MD, Alyce A   Arthritis    Chronic migraine w/o aura, not intractable, w/o stat migr    Chronic pain of right knee 06/12/2021   Chronic pain syndrome 07/30/2013   DEGENERATIVE DISC DISEASE, LUMBOSACRAL SPINE 01/16/2009   Qualifier: Diagnosis of  By: Kassie MD, Sean A    Depression    DIAPHORESIS 01/16/2009   Qualifier: Diagnosis of  By: Kassie MD, Sean A    Edema 01/16/2009   Qualifier: Diagnosis of  By: Kassie MD, Alyce DELENA   Formatting of this note might be different from the original. Overview:  Qualifier: Diagnosis of  By: Kassie MD, Sean A   Essential hypertension 01/28/2017   Essential thrombocytosis (HCC) 11/25/2017   Fibromyalgia    GERD 01/16/2009   Qualifier: Diagnosis of  By: Kassie MD, Sean A    Heart failure (HCC) 05/08/2021   HYPERTENSION 01/16/2009   Qualifier: Diagnosis of  By: Kassie MD, Sean A    Hypokalemia 01/16/2009   Qualifier: Diagnosis of  By: Kassie MD, Alyce DELENA   Formatting of this note might be different from the original. Overview:  Qualifier: Diagnosis of  By: Kassie MD, Sean A   Left hip pain 05/24/2019   Lumbosacral spondylosis 10/09/2010   Lymphedema 02/10/2021   Memory loss 01/16/2009   Qualifier: Diagnosis of  By: Kassie MD, Alyce DELENA   Formatting of this note might be different from the original. Overview:  Qualifier: Diagnosis of  By: Kassie MD, Alyce DELENA  Menopausal and postmenopausal disorder 01/16/2009   Qualifier: Diagnosis of  By: Kassie MD, Alyce LABOR   Formatting of this note might be different from the original. Overview:  Qualifier: Diagnosis of  By: Kassie MD, Sean A   Moderate aortic regurgitation 05/21/2019   Neuropathic pain 09/25/2020   Neuropathy    Obesity (BMI 30.0-34.9) 10/13/2021   Pain in soft tissues of limb 06/20/2012   Formatting of this note might be different from the original. Overview:  IMPRESSION: Left knee pain Formatting of this note might be different from the original.  IMPRESSION: Left knee pain   Pain in thoracic spine 07/30/2013   Palpitations 01/28/2017   Primary osteoarthritis of right knee 10/06/2014   Restless legs syndrome 08/25/2010   Shortness of breath on exertion 01/28/2017   SI (sacroiliac) pain 05/10/2016   Thoracic spondylosis 11/20/2010   Overview:  Overview:  IMPRESSION: Thoracic radiculopathy Overview:  IMPRESSION: Thoracic radiculopathy  Formatting of this note might be different from the original. Overview:  IMPRESSION: Thoracic radiculopathy Formatting of this note might be different from the original. IMPRESSION: Thoracic radiculopathy   Thrombocythemia, essential (HCC) 09/12/2013   Thyroid  disease    TMJ (dislocation of temporomandibular joint) 07/30/2013   Tremor 12/29/2021   Unspecified hypothyroidism 01/16/2009   Qualifier: Diagnosis of  By: Kassie MD, Sean A    Uterine cancer (HCC) 07/30/2013    Past Surgical History:  Procedure Laterality Date   APPENDECTOMY     CERVICAL DISC SURGERY     CHOLECYSTECTOMY     FRACTURE SURGERY Left    Left arm has a metal plate   KNEE SURGERY Bilateral    TKR   ROBOTIC ASSISTED LAP VAGINAL HYSTERECTOMY     did not take ovaries. had uterine cancer.    Family History  Problem Relation Age of Onset   Deep vein thrombosis Father    Heart disease Father    Heart failure Father    Alzheimer's disease Father    Heart disease Brother    Pulmonary embolism Brother    Heart disease Brother    Heart attack Brother    Birth defects Son    Drug abuse Son    Mental illness Son    Bipolar disorder Son     Social History   Socioeconomic History   Marital status: Married    Spouse name: Not on file   Number of children: Not on file   Years of education: Not on file   Highest education level: GED or equivalent  Occupational History   Not on file  Tobacco Use   Smoking status: Former    Passive exposure: Past   Smokeless tobacco: Never   Tobacco comments:    at age 89  Vaping Use    Vaping status: Never Used  Substance and Sexual Activity   Alcohol use: No   Drug use: No   Sexual activity: Not on file  Other Topics Concern   Not on file  Social History Narrative   Right handed   Lives at home with husband   Social Drivers of Health   Tobacco Use: Medium Risk (05/02/2024)   Patient History    Smoking Tobacco Use: Former    Smokeless Tobacco Use: Never    Passive Exposure: Past  Physicist, Medical Strain: Low Risk (03/21/2023)   Overall Financial Resource Strain (CARDIA)    Difficulty of Paying Living Expenses: Not hard at all  Food Insecurity: No Food Insecurity (12/16/2023)   Epic  Worried About Programme Researcher, Broadcasting/film/video in the Last Year: Never true    Ran Out of Food in the Last Year: Never true  Transportation Needs: No Transportation Needs (12/16/2023)   Epic    Lack of Transportation (Medical): No    Lack of Transportation (Non-Medical): No  Physical Activity: Inactive (03/21/2023)   Exercise Vital Sign    Days of Exercise per Week: 0 days    Minutes of Exercise per Session: 0 min  Stress: No Stress Concern Present (03/21/2023)   [employer] of Occupational Health - Occupational Stress Questionnaire    Feeling of Stress : Not at all  Social Connections: Socially Integrated (10/11/2023)   Social Connection and Isolation Panel    Frequency of Communication with Friends and Family: More than three times a week    Frequency of Social Gatherings with Friends and Family: Twice a week    Attends Religious Services: More than 4 times per year    Active Member of Clubs or Organizations: Yes    Attends Banker Meetings: More than 4 times per year    Marital Status: Married  Catering Manager Violence: Not At Risk (12/16/2023)   Epic    Fear of Current or Ex-Partner: No    Emotionally Abused: No    Physically Abused: No    Sexually Abused: No  Depression (PHQ2-9): Low Risk (05/08/2024)   Depression (PHQ2-9)    PHQ-2 Score: 1  Recent Concern:  Depression (PHQ2-9) - High Risk (03/26/2024)   Depression (PHQ2-9)    PHQ-2 Score: 20  Alcohol Screen: Low Risk (11/03/2023)   Alcohol Screen    Last Alcohol Screening Score (AUDIT): 0  Housing: High Risk (12/16/2023)   Epic    Unable to Pay for Housing in the Last Year: No    Number of Times Moved in the Last Year: 0    Homeless in the Last Year: Yes  Utilities: Not At Risk (12/16/2023)   Epic    Threatened with loss of utilities: No  Health Literacy: Adequate Health Literacy (03/21/2023)   B1300 Health Literacy    Frequency of need for help with medical instructions: Never    Outpatient Medications Prior to Visit  Medication Sig Dispense Refill   acetaminophen  (TYLENOL ) 650 MG CR tablet Take 650 mg by mouth every 8 (eight) hours as needed for pain.     albuterol  (VENTOLIN  HFA) 108 (90 Base) MCG/ACT inhaler Inhale 2 puffs into the lungs every 6 (six) hours as needed for wheezing or shortness of breath. 8 g 2   aspirin  81 MG chewable tablet Chew 81 mg by mouth daily.     Biotin 10 MG TABS Take by mouth.     buprenorphine  (SUBUTEX ) 2 MG SUBL SL tablet Place 2 mg under the tongue daily.     buPROPion  (WELLBUTRIN  XL) 300 MG 24 hr tablet Take 1 tablet by mouth daily.     Calcium Carb-Cholecalciferol 500-10 MG-MCG TABS Take by mouth.     Cholecalciferol (VITAMIN D3) 250 MCG (10000 UT) TABS Take 1 tablet by mouth daily.     diclofenac Sodium (VOLTAREN) 1 % GEL Apply 0.5 g topically 4 (four) times daily.     diltiazem  (CARDIZEM  CD) 120 MG 24 hr capsule Take 120 mg by mouth daily.     donepezil  (ARICEPT ) 10 MG tablet Take 1 tablet by mouth once daily 90 tablet 0   DULoxetine  (CYMBALTA ) 60 MG capsule Take 60 mg by mouth 2 (two) times daily.  ergocalciferol (VITAMIN D2) 1.25 MG (50000 UT) capsule Take 50,000 Units by mouth once a week.     finasteride  (PROSCAR ) 5 MG tablet Take 5 mg by mouth daily.     fluticasone  (FLONASE ) 50 MCG/ACT nasal spray Place 1 spray into both nostrils daily as  needed for allergies or rhinitis. 15.8 mL 1   furosemide  (LASIX ) 20 MG tablet Take 1 tablet (20 mg total) by mouth 2 (two) times daily. 180 tablet 0   iron  polysaccharides (FERREX 150) 150 MG capsule Take 1 capsule (150 mg total) by mouth daily. 90 capsule 0   levothyroxine  (SYNTHROID ) 50 MCG tablet Take 1 tablet by mouth once daily 90 tablet 0   loratadine (CLARITIN) 10 MG tablet Take 10 mg by mouth daily.     losartan  (COZAAR ) 100 MG tablet Take 1 tablet (100 mg total) by mouth daily. 90 tablet 0   Misc Natural Products (FIBER 7 PO) Take by mouth.     mupirocin ointment (BACTROBAN) 2 % 1 Application 2 (two) times daily. (Patient not taking: Reported on 05/08/2024)     nitroGLYCERIN  (NITROSTAT ) 0.4 MG SL tablet Place 1 tablet (0.4 mg total) under the tongue as needed for chest pain. 25 tablet 3   omeprazole  (PRILOSEC) 20 MG capsule Take 1 capsule by mouth once daily 90 capsule 0   oxyCODONE -acetaminophen  (PERCOCET) 10-325 MG tablet Take 1 tablet by mouth 4 (four) times daily as needed.     pregabalin  (LYRICA ) 75 MG capsule TAKE 1 CAPSULE BY MOUTH THREE TIMES DAILY 90 capsule 2   propranolol ER (INDERAL LA) 60 MG 24 hr capsule Take 60 mg by mouth daily. (Patient not taking: Reported on 05/08/2024)     rOPINIRole  (REQUIP ) 1 MG tablet Take 1 tablet by mouth daily.     thiamine (VITAMIN B1) 100 MG tablet Take by mouth.     topiramate  (TOPAMAX ) 25 MG tablet Take 1 tablet by mouth twice daily 180 tablet 0   traZODone  (DESYREL ) 50 MG tablet Take 1 at night as needed for sleep     Vibegron  (GEMTESA ) 75 MG TABS Take 1 tablet (75 mg total) by mouth daily. 28 tablet 0   lidocaine (LIDODERM) 5 % Place onto the skin. (Patient not taking: Reported on 05/08/2024)     No facility-administered medications prior to visit.    Allergies[1]  Review of Systems  All other systems reviewed and are negative.      Objective:        05/08/2024    1:25 PM 05/02/2024    3:28 PM 03/26/2024    2:57 PM  Vitals with  BMI  Height  5' 3 5' 3.25  Weight 195 lbs 199 lbs 200 lbs  BMI 34.55 35.26 35.13  Systolic 144 160 871  Diastolic 72 70 62  Pulse 67 77 70    No data found.   Physical Exam Vitals reviewed.  Constitutional:      Appearance: Normal appearance.  HENT:     Right Ear: Tympanic membrane, ear canal and external ear normal.     Left Ear: Tympanic membrane, ear canal and external ear normal.     Nose: Nose normal.     Mouth/Throat:     Pharynx: Oropharynx is clear.  Eyes:     Conjunctiva/sclera:     Right eye: Right conjunctiva is not injected. Chemosis present. No exudate.    Left eye: Left conjunctiva is not injected. Chemosis present. No exudate.    Comments: CLEAR DISCHARGE.  Cardiovascular:     Rate and Rhythm: Normal rate and regular rhythm.     Heart sounds: Normal heart sounds. No murmur heard. Pulmonary:     Effort: Pulmonary effort is normal. No respiratory distress.     Breath sounds: Normal breath sounds.  Lymphadenopathy:     Cervical: No cervical adenopathy.  Skin:    Findings: Rash (INTERTRIGO) present.  Neurological:     Mental Status: She is alert and oriented to person, place, and time.  Psychiatric:        Mood and Affect: Mood normal.        Behavior: Behavior normal.     Health Maintenance Due  Topic Date Due   FOOT EXAM  Never done   Diabetic kidney evaluation - Urine ACR  Never done   Bone Density Scan  Never done   OPHTHALMOLOGY EXAM  09/09/2022   Medicare Annual Wellness (AWV)  03/20/2024    There are no preventive care reminders to display for this patient.   Lab Results  Component Value Date   TSH 1.660 03/26/2024   Lab Results  Component Value Date   WBC 6.2 03/26/2024   HGB 13.3 03/26/2024   HCT 40.2 03/26/2024   MCV 93 03/26/2024   PLT 321 03/26/2024   Lab Results  Component Value Date   NA 138 03/26/2024   K 4.2 03/26/2024   CO2 24 03/26/2024   GLUCOSE 117 (H) 03/26/2024   BUN 8 03/26/2024   CREATININE 0.94  03/26/2024   BILITOT 0.3 03/26/2024   ALKPHOS 75 03/26/2024   AST 20 03/26/2024   ALT 17 03/26/2024   PROT 7.1 03/26/2024   ALBUMIN 4.5 03/26/2024   CALCIUM 9.8 03/26/2024   ANIONGAP 6 10/11/2023   EGFR 62 03/26/2024   Lab Results  Component Value Date   CHOL 183 03/26/2024   Lab Results  Component Value Date   HDL 45 03/26/2024   Lab Results  Component Value Date   LDLCALC 108 (H) 03/26/2024   Lab Results  Component Value Date   TRIG 171 (H) 03/26/2024   Lab Results  Component Value Date   CHOLHDL 4.1 03/26/2024   Lab Results  Component Value Date   HGBA1C 5.3 03/26/2024        Results for orders placed or performed in visit on 03/26/24  B12 and Folate Panel   Collection Time: 03/26/24  3:53 PM  Result Value Ref Range   Vitamin B-12 883 232 - 1,245 pg/mL   Folate >20.0 >3.0 ng/mL  Methylmalonic acid, serum   Collection Time: 03/26/24  3:53 PM  Result Value Ref Range   Methylmalonic Acid 215 0 - 378 nmol/L  CBC with Differential/Platelet   Collection Time: 03/26/24  4:07 PM  Result Value Ref Range   WBC 6.2 3.4 - 10.8 x10E3/uL   RBC 4.34 3.77 - 5.28 x10E6/uL   Hemoglobin 13.3 11.1 - 15.9 g/dL   Hematocrit 59.7 65.9 - 46.6 %   MCV 93 79 - 97 fL   MCH 30.6 26.6 - 33.0 pg   MCHC 33.1 31.5 - 35.7 g/dL   RDW 87.6 88.2 - 84.5 %   Platelets 321 150 - 450 x10E3/uL   Neutrophils 42 Not Estab. %   Lymphs 45 Not Estab. %   Monocytes 8 Not Estab. %   Eos 4 Not Estab. %   Basos 1 Not Estab. %   Neutrophils Absolute 2.6 1.4 - 7.0 x10E3/uL   Lymphocytes Absolute 2.8 0.7 -  3.1 x10E3/uL   Monocytes Absolute 0.5 0.1 - 0.9 x10E3/uL   EOS (ABSOLUTE) 0.2 0.0 - 0.4 x10E3/uL   Basophils Absolute 0.0 0.0 - 0.2 x10E3/uL   Immature Granulocytes 0 Not Estab. %   Immature Grans (Abs) 0.0 0.0 - 0.1 x10E3/uL  Comprehensive metabolic panel with GFR   Collection Time: 03/26/24  4:07 PM  Result Value Ref Range   Glucose 117 (H) 70 - 99 mg/dL   BUN 8 8 - 27 mg/dL    Creatinine, Ser 9.05 0.57 - 1.00 mg/dL   eGFR 62 >40 fO/fpw/8.26   BUN/Creatinine Ratio 9 (L) 12 - 28   Sodium 138 134 - 144 mmol/L   Potassium 4.2 3.5 - 5.2 mmol/L   Chloride 102 96 - 106 mmol/L   CO2 24 20 - 29 mmol/L   Calcium 9.8 8.7 - 10.3 mg/dL   Total Protein 7.1 6.0 - 8.5 g/dL   Albumin 4.5 3.8 - 4.8 g/dL   Globulin, Total 2.6 1.5 - 4.5 g/dL   Bilirubin Total 0.3 0.0 - 1.2 mg/dL   Alkaline Phosphatase 75 49 - 135 IU/L   AST 20 0 - 40 IU/L   ALT 17 0 - 32 IU/L  Lipid panel   Collection Time: 03/26/24  4:07 PM  Result Value Ref Range   Cholesterol, Total 183 100 - 199 mg/dL   Triglycerides 828 (H) 0 - 149 mg/dL   HDL 45 >60 mg/dL   VLDL Cholesterol Cal 30 5 - 40 mg/dL   LDL Chol Calc (NIH) 891 (H) 0 - 99 mg/dL   Chol/HDL Ratio 4.1 0.0 - 4.4 ratio  Hemoglobin A1c   Collection Time: 03/26/24  4:07 PM  Result Value Ref Range   Hgb A1c MFr Bld 5.3 4.8 - 5.6 %   Est. average glucose Bld gHb Est-mCnc 105 mg/dL  T4, free   Collection Time: 03/26/24  4:07 PM  Result Value Ref Range   Free T4 0.88 0.82 - 1.77 ng/dL  TSH   Collection Time: 03/26/24  4:07 PM  Result Value Ref Range   TSH 1.660 0.450 - 4.500 uIU/mL     Assessment & Plan:   Assessment & Plan Non-seasonal allergic rhinitis, unspecified trigger Symptoms consistent with allergic conjunctivitis and rhinitis. Previous medicated drops caused burning; saline drops provided relief. Currently using Flonase . - Prescribed allergy drops for eyes. - Continue Flonase  nasal spray. - Start oral allergy medication (loratadine, fexofenadine, or cetirizine).    Allergic conjunctivitis of both eyes Symptoms consistent with allergic conjunctivitis and rhinitis. Previous medicated drops caused burning; saline drops provided relief. Currently using Flonase . - Prescribed allergy drops for eyes. - Continue Flonase  nasal spray. - Start oral allergy medication (loratadine, fexofenadine, or cetirizine).    Candidal  intertrigo Recurrent rash under breast and groin, suggestive of candidal intertrigo. - Prescribed antifungal powder for rash.    Chronic right hip pain MRI confirmed torn cartilage and bursitis. Significant pain and mobility issues. Discussed potential hip replacement surgery. - Referred to orthopedics for surgical evaluation.    Primary osteoarthritis of both hands Severe osteoarthritis causing functional impairment. Priority given to hip issue due to mobility concerns. - Continue with current management and plan for future surgical intervention.    Acute non-recurrent maxillary sinusitis - CEFDINIR  PRESCRIPTION SENT.        Body mass index is 35.25 kg/m..  Meds ordered this encounter  Medications   azelastine  (OPTIVAR ) 0.05 % ophthalmic solution    Sig: Place 1 drop into both  eyes 2 (two) times daily.    Dispense:  6 mL    Refill:  12   cefdinir  (OMNICEF ) 300 MG capsule    Sig: Take 1 capsule (300 mg total) by mouth 2 (two) times daily.    Dispense:  20 capsule    Refill:  0   nystatin  (MYCOSTATIN /NYSTOP ) powder    Sig: Apply 1 Application topically 3 (three) times daily.    Dispense:  60 g    Refill:  5    No orders of the defined types were placed in this encounter.    Follow-up: Return if symptoms worsen or fail to improve.  An After Visit Summary was printed and given to the patient.  Abigail Free, MD [facility] [phone]     [1]  Allergies Allergen Reactions   Cottonseed Oil Hives and Itching    Hives, Itching Raw Cotton    Codeine Sulfate Nausea And Vomiting   "

## 2024-05-02 NOTE — Patient Instructions (Signed)
 SENT ALLERGY EYE DROPS AND CEFDINIR  (ANTIBIOTIC) FOR SINUS INFECTION.  START ALLERGY MEDICINE AT HOME.   HIP PAIN: REFERRED TO ORTHOPEDICS.   MYCOSTATIN  POWDER FOR RASH.

## 2024-05-03 ENCOUNTER — Encounter: Admitting: Internal Medicine

## 2024-05-08 ENCOUNTER — Other Ambulatory Visit: Payer: Self-pay

## 2024-05-08 NOTE — Patient Instructions (Signed)
 Visit Information  Thank you for taking time to visit with me today. Please don't hesitate to contact me if I can be of assistance to you before our next scheduled appointment.  Your next care management appointment is by telephone on 06/06/24 at 1:00 pm   Please call the care guide team at [phone] if you need to cancel, schedule, or reschedule an appointment.   Please call the Suicide and Crisis Lifeline: 988 call the [place]  National Suicide Prevention Lifeline: 308 355 7338 or TTY: 985-857-4693 TTY 9406168588) to talk to a trained counselor if you are experiencing a Mental Health or Behavioral Health Crisis or need someone to talk to.  Heddy Shutter, RN, MSN, BSN, CCM [facility], Population Health Case Manager Phone: [phone]

## 2024-05-08 NOTE — Patient Outreach (Signed)
 Complex Care Management   Visit Note  05/08/2024  Name:  Lauren Roth MRN: [id number] DOB: 05/22/1945  Situation: Referral received for Complex Care Management related to Pain, Fall risk I obtained verbal consent from Patient.  Visit completed with Patient  on the phone  Background:   Past Medical History:  Diagnosis Date   Allergic rhinitis 01/16/2009   Qualifier: Diagnosis of  By: Kassie MD, Alyce DELENA Deal of this note might be different from the original. Overview:  Qualifier: Diagnosis of  By: Kassie MD, Alyce A   Arthritis    Chronic migraine w/o aura, not intractable, w/o stat migr    Chronic pain of right knee 06/12/2021   Chronic pain syndrome 07/30/2013   DEGENERATIVE DISC DISEASE, LUMBOSACRAL SPINE 01/16/2009   Qualifier: Diagnosis of  By: Kassie MD, Sean A    Depression    DIAPHORESIS 01/16/2009   Qualifier: Diagnosis of  By: Kassie MD, Alyce A    Edema 01/16/2009   Qualifier: Diagnosis of  By: Kassie MD, Alyce DELENA   Formatting of this note might be different from the original. Overview:  Qualifier: Diagnosis of  By: Kassie MD, Sean A   Essential hypertension 01/28/2017   Essential thrombocytosis (HCC) 11/25/2017   Fibromyalgia    GERD 01/16/2009   Qualifier: Diagnosis of  By: Kassie MD, Sean A    Heart failure (HCC) 05/08/2021   HYPERTENSION 01/16/2009   Qualifier: Diagnosis of  By: Kassie MD, Sean A    Hypokalemia 01/16/2009   Qualifier: Diagnosis of  By: Kassie MD, Alyce DELENA   Formatting of this note might be different from the original. Overview:  Qualifier: Diagnosis of  By: Kassie MD, Sean A   Left hip pain 05/24/2019   Lumbosacral spondylosis 10/09/2010   Lymphedema 02/10/2021   Memory loss 01/16/2009   Qualifier: Diagnosis of  By: Kassie MD, Alyce DELENA   Formatting of this note might be different from the original. Overview:  Qualifier: Diagnosis of  By: Kassie MD, Sean A   Menopausal and postmenopausal disorder 01/16/2009   Qualifier: Diagnosis  of  By: Kassie MD, Alyce DELENA   Formatting of this note might be different from the original. Overview:  Qualifier: Diagnosis of  By: Kassie MD, Sean A   Moderate aortic regurgitation 05/21/2019   Neuropathic pain 09/25/2020   Neuropathy    Obesity (BMI 30.0-34.9) 10/13/2021   Pain in soft tissues of limb 06/20/2012   Formatting of this note might be different from the original. Overview:  IMPRESSION: Left knee pain Formatting of this note might be different from the original. IMPRESSION: Left knee pain   Pain in thoracic spine 07/30/2013   Palpitations 01/28/2017   Primary osteoarthritis of right knee 10/06/2014   Restless legs syndrome 08/25/2010   Shortness of breath on exertion 01/28/2017   SI (sacroiliac) pain 05/10/2016   Thoracic spondylosis 11/20/2010   Overview:  Overview:  IMPRESSION: Thoracic radiculopathy Overview:  IMPRESSION: Thoracic radiculopathy  Formatting of this note might be different from the original. Overview:  IMPRESSION: Thoracic radiculopathy Formatting of this note might be different from the original. IMPRESSION: Thoracic radiculopathy   Thrombocythemia, essential (HCC) 09/12/2013   Thyroid  disease    TMJ (dislocation of temporomandibular joint) 07/30/2013   Tremor 12/29/2021   Unspecified hypothyroidism 01/16/2009   Qualifier: Diagnosis of  By: Kassie MD, Sean A    Uterine cancer (HCC) 07/30/2013    Assessment: Patient Reported Symptoms:  Cognitive Cognitive Status: No symptoms reported,  Alert and oriented to person, place, and time, Insightful and able to interpret abstract concepts      Neurological Neurological Review of Symptoms: Vision changes (patient reports she is seeing an opthalmologist re: double vision. patient reports has Prism  glasses, and is being followed by [facility] in [place].)    HEENT HEENT Symptoms Reported: No symptoms reported      Cardiovascular Cardiovascular Symptoms Reported: Other: Other Cardiovascular Symptoms: patient  reports history of lymphadema. Sees a provider at [facility]. Compression wraps. Does patient have uncontrolled Hypertension?: Yes Is patient checking Blood Pressure at home?: Yes (patient reports checkes everyday and sometimes twice a day.) Cardiovascular Management Strategies: Medication therapy, Routine screening, Coping strategies, Adequate rest Weight: 195 lb (88.5 kg) (per patient home reading today.)  Respiratory Respiratory Symptoms Reported: No symptoms reported    Endocrine Endocrine Symptoms Reported: No symptoms reported Is patient diabetic?: No    Gastrointestinal Additional Gastrointestinal Details: reports alternating constipation/diarrhea-per patient PCP ordering stool test to send off and referring to a GI. last bowel movement was today.      Genitourinary Genitourinary Symptoms Reported: No symptoms reported    Integumentary Integumentary Symptoms Reported: No symptoms reported    Musculoskeletal Musculoskelatal Symptoms Reviewed: Joint pain, Limited mobility Additional Musculoskeletal Details: reports right hip pain-MRI done. paitent saw PCP on 05/02/24 and is referring to [facility] for evaluation Musculoskeletal Management Strategies: Routine screening, Coping strategies, Adequate rest Musculoskeletal Self-Management Outcome: 3 (uncertain)      Psychosocial Additional Psychological Details: patient seen by behavior health and states she knows how to reach therapist if needed before her appointment.          05/08/2024    PHQ2-9 Depression Screening   Little interest or pleasure in doing things Not at all  Feeling down, depressed, or hopeless Several days  PHQ-2 - Total Score 1  Trouble falling or staying asleep, or sleeping too much    Feeling tired or having little energy    Poor appetite or overeating     Feeling bad about yourself - or that you are a failure or have let yourself or your family down    Trouble concentrating on things,  such as reading the newspaper or watching television    Moving or speaking so slowly that other people could have noticed.  Or the opposite - being so fidgety or restless that you have been moving around a lot more than usual    Thoughts that you would be better off dead, or hurting yourself in some way    PHQ2-9 Total Score    If you checked off any problems, how difficult have these problems made it for you to do your work, take care of things at home, or get along with other people    Depression Interventions/Treatment      Today's Vitals   05/08/24 1325  BP: (!) 144/72  Pulse: 67  Weight: 195 lb (88.5 kg)   Pain Scale: 0-10 Pain Score: 4  Pain Type: Chronic pain Pain Descriptors / Indicators: Constant Pain Onset: On-going Patients Stated Pain Goal: 4  Medications Reviewed Today     Reviewed by Gevin Perea M, RN (Registered Nurse) on 05/08/24 at 1353  Med List Status: <None>   Medication Order Taking? Sig Documenting Provider Last Dose Status Informant  acetaminophen  (TYLENOL ) 650 MG CR tablet [id number] Yes Take 650 mg by mouth every 8 (eight) hours as needed for pain. [provider]  Active  albuterol  (VENTOLIN  HFA) 108 (90 Base) MCG/ACT inhaler [id number] Yes Inhale 2 puffs into the lungs every 6 (six) hours as needed for wheezing or shortness of breath. Teressa Harrie HERO, FNP  Active   aspirin  81 MG chewable tablet [id number] Yes Chew 81 mg by mouth daily. [provider]  Active Self  azelastine  (OPTIVAR ) 0.05 % ophthalmic solution [id number] Yes Place 1 drop into both eyes 2 (two) times daily. Sherre Clapper, MD  Active   Biotin 10 MG TABS [id number] Yes Take by mouth. [provider]  Active Self  buprenorphine  (SUBUTEX ) 2 MG SUBL SL tablet [id number] Yes Place 2 mg under the tongue daily. [provider]  Active   buPROPion  (WELLBUTRIN  XL) 300 MG 24 hr tablet [id number] Yes Take 1 tablet by mouth daily. [provider]  Active Self   Calcium Carb-Cholecalciferol 500-10 MG-MCG TABS [id number] Yes Take by mouth. [provider]  Active Self  cefdinir  (OMNICEF ) 300 MG capsule [id number] Yes Take 1 capsule (300 mg total) by mouth 2 (two) times daily. Cox, Kirsten, MD  Active   Cholecalciferol (VITAMIN D3) 250 MCG (10000 UT) TABS [id number] Yes Take 1 tablet by mouth daily. [provider]  Active   diclofenac Sodium (VOLTAREN) 1 % GEL [id number] Yes Apply 0.5 g topically 4 (four) times daily. [provider]  Active Self  diltiazem  (CARDIZEM  CD) 120 MG 24 hr capsule [id number] Yes Take 120 mg by mouth daily. [provider]  Active Self  donepezil  (ARICEPT ) 10 MG tablet [id number] Yes Take 1 tablet by mouth once daily Cox, Kirsten, MD  Active   DULoxetine  (CYMBALTA ) 60 MG capsule [id number] Yes Take 60 mg by mouth 2 (two) times daily. [provider]  Active Self  ergocalciferol (VITAMIN D2) 1.25 MG (50000 UT) capsule [id number] Yes Take 50,000 Units by mouth once a week. [provider]  Active Self  finasteride  (PROSCAR ) 5 MG tablet [id number] Yes Take 5 mg by mouth daily. [provider]  Active Self  fluticasone  (FLONASE ) 50 MCG/ACT nasal spray [id number] Yes Place 1 spray into both nostrils daily as needed for allergies or rhinitis. Cox, Kirsten, MD  Active Self  furosemide  (LASIX ) 20 MG tablet [id number] Yes Take 1 tablet (20 mg total) by mouth 2 (two) times daily. Teressa Harrie HERO, FNP  Active   iron  polysaccharides (FERREX 150) 150 MG capsule [id number] Yes Take 1 capsule (150 mg total) by mouth daily. Cox, Kirsten, MD  Active   levothyroxine  (SYNTHROID ) 50 MCG tablet [id number] Yes Take 1 tablet by mouth once daily Cox, Kirsten, MD  Active   lidocaine (LIDODERM) 5 % [id number]  Place onto the skin.  Patient not taking: Reported on 05/08/2024   [provider]  Active Self  loratadine (CLARITIN) 10 MG tablet [id number] Yes Take 10 mg by mouth daily. [provider]  Active Self  losartan  (COZAAR ) 100 MG tablet [id number] Yes Take 1 tablet (100 mg total) by mouth daily. CoxClapper, MD  Active   Misc Natural Products (FIBER 7 PO) [id number] Yes Take by mouth. [provider]  Active Self  mupirocin ointment (BACTROBAN) 2 % [id number]  1 Application 2 (two) times daily.  Patient not taking: Reported on 05/08/2024   [provider]  Active   nitroGLYCERIN  (NITROSTAT ) 0.4 MG SL tablet [id number] Yes Place 1 tablet (0.4 mg total) under the tongue as needed for chest pain. Sherre Clapper, MD  Active Self  nystatin  (MYCOSTATIN /NYSTOP ) powder [id number] Yes Apply 1 Application  topically 3 (three) times daily. Cox, Kirsten, MD  Active   omeprazole  (PRILOSEC) 20 MG capsule [id number] Yes Take 1 capsule by mouth once daily Cox, Kirsten, MD  Active   oxyCODONE -acetaminophen  (PERCOCET) 10-325 MG tablet [id number] Yes Take 1 tablet by mouth 4 (four) times daily as needed. [provider]  Active   pregabalin  (LYRICA ) 75 MG capsule [id number] Yes TAKE 1 CAPSULE BY MOUTH THREE TIMES DAILY Cox, Kirsten, MD  Active   propranolol ER (INDERAL LA) 60 MG 24 hr capsule [id number]  Take 60 mg by mouth daily.  Patient not taking: Reported on 05/08/2024   [provider]  Active   rOPINIRole  (REQUIP ) 1 MG tablet [id number] Yes Take 1 tablet by mouth daily. [provider]  Active Self  thiamine (VITAMIN B1) 100 MG tablet [id number] Yes Take by mouth. [provider]  Active Self  topiramate  (TOPAMAX ) 25 MG tablet [id number] Yes Take 1 tablet by mouth twice daily Jacobs, Dina M, FNP  Active   traZODone  (DESYREL ) 50 MG tablet [id number] Yes Take 1 at night as needed for sleep [provider]  Active   Vibegron  (GEMTESA ) 75 MG TABS [id number] Yes Take 1 tablet (75 mg total) by mouth daily. Sherre Clapper, MD  Active           Recommendation:   Specialty provider follow-up orthopedic provider as recommended by PCP Continue Current  Plan of Care  Follow Up Plan:   Telephone follow up appointment date/time:  06/06/24 at 1:00 pm  Heddy Shutter, RN, MSN, BSN, CCM [place]  [place], Population Health Case Manager Phone: [phone]

## 2024-05-09 ENCOUNTER — Ambulatory Visit: Payer: Self-pay

## 2024-05-09 NOTE — Telephone Encounter (Signed)
 GLF with obvious deformity and excruciating pain to shoulder, did not complete a full NT assessment as it has been quickly determined the pt needs EMS. EMS en route.    FYI Only or Action Required?: Action required by provider: update on patient condition.  Patient was last seen in primary care on 05/02/2024 by Sherre Clapper, MD.  Called Nurse Triage reporting Fall and Shoulder Injury.  Symptoms began today.  Interventions attempted: Nothing.  Symptoms are: rapidly worsening.  Triage Disposition: Call EMS 911 Now  Patient/caregiver understands and will follow disposition?: Yes  Reason for Triage: Patient fell this morning and states she broke shoulder and it's in 2 levels, hurts extremely bad  Reason for Disposition  Serious injury with multiple fractures (broken bones)  Answer Assessment - Initial Assessment Questions 1. MECHANISM: How did the injury happen?     GLF, did not complete a full NT assessment as it has been quickly determined the pt needs EMS   2. ONSET: When did the injury happen? (e.g., minutes, hours ago)      This AM  3. APPEARANCE of INJURY: What does the injury look like?      Pt reports it's either broken or dislocated  6. PAIN: Is there pain? If Yes, ask: How bad is the pain?  (Scale 0-10; or none, mild, moderate, severe)     Excruciating  Pt speaking in a calm voice, in multiple full sentences at a time; no shortness of breath noted. Husband is with her. She states she is no longer on the floor. Door is unlocked for EMS. Pt denies other questions at this time.  Protocols used: Shoulder Injury-A-AH

## 2024-05-12 DIAGNOSIS — J0100 Acute maxillary sinusitis, unspecified: Secondary | ICD-10-CM | POA: Insufficient documentation

## 2024-05-12 DIAGNOSIS — H1013 Acute atopic conjunctivitis, bilateral: Secondary | ICD-10-CM | POA: Insufficient documentation

## 2024-05-12 DIAGNOSIS — M19041 Primary osteoarthritis, right hand: Secondary | ICD-10-CM | POA: Insufficient documentation

## 2024-05-12 NOTE — Assessment & Plan Note (Signed)
 Symptoms consistent with allergic conjunctivitis and rhinitis. Previous medicated drops caused burning; saline drops provided relief. Currently using Flonase . - Prescribed allergy drops for eyes. - Continue Flonase  nasal spray. - Start oral allergy medication (loratadine, fexofenadine, or cetirizine).

## 2024-05-12 NOTE — Assessment & Plan Note (Signed)
 MRI confirmed torn cartilage and bursitis. Significant pain and mobility issues. Discussed potential hip replacement surgery. - Referred to orthopedics for surgical evaluation.

## 2024-05-12 NOTE — Assessment & Plan Note (Signed)
 Severe osteoarthritis causing functional impairment. Priority given to hip issue due to mobility concerns. - Continue with current management and plan for future surgical intervention.

## 2024-05-12 NOTE — Assessment & Plan Note (Signed)
 Recurrent rash under breast and groin, suggestive of candidal intertrigo. - Prescribed antifungal powder for rash.

## 2024-05-12 NOTE — Assessment & Plan Note (Signed)
-   CEFDINIR  PRESCRIPTION SENT.

## 2024-05-14 ENCOUNTER — Other Ambulatory Visit: Payer: Self-pay | Admitting: Family Medicine

## 2024-05-25 ENCOUNTER — Ambulatory Visit (HOSPITAL_BASED_OUTPATIENT_CLINIC_OR_DEPARTMENT_OTHER): Admitting: Family Medicine

## 2024-05-25 ENCOUNTER — Ambulatory Visit (HOSPITAL_BASED_OUTPATIENT_CLINIC_OR_DEPARTMENT_OTHER): Admission: RE | Admit: 2024-05-25 | Source: Ambulatory Visit | Admitting: Radiology

## 2024-05-25 ENCOUNTER — Ambulatory Visit: Payer: Self-pay

## 2024-05-25 ENCOUNTER — Encounter (HOSPITAL_BASED_OUTPATIENT_CLINIC_OR_DEPARTMENT_OTHER): Payer: Self-pay | Admitting: Family Medicine

## 2024-05-25 VITALS — BP 128/69 | HR 92 | Temp 98.6°F | Resp 16 | Wt 204.7 lb

## 2024-05-25 DIAGNOSIS — L03031 Cellulitis of right toe: Secondary | ICD-10-CM

## 2024-05-25 DIAGNOSIS — M19042 Primary osteoarthritis, left hand: Secondary | ICD-10-CM

## 2024-05-25 DIAGNOSIS — M79671 Pain in right foot: Secondary | ICD-10-CM

## 2024-05-25 MED ORDER — PREDNISONE 20 MG PO TABS: 20.0000 mg | ORAL_TABLET | Freq: Two times a day (BID) | ORAL | 0 refills | Status: AC

## 2024-05-25 MED ORDER — CEPHALEXIN 500 MG PO CAPS: 500.0000 mg | ORAL_CAPSULE | Freq: Two times a day (BID) | ORAL | 0 refills | Status: AC

## 2024-05-25 NOTE — Telephone Encounter (Signed)
 FYI Only or Action Required?: FYI only for provider: appointment scheduled on 05/25/24.  Patient was last seen in primary care on 05/02/2024 by Sherre Clapper, MD.  Called Nurse Triage reporting Hand Pain.  Symptoms began 1+ years, more severe x 1 1/2 weeks.  Interventions attempted: OTC medications: tylenol  and Prescription medications: Buprenorphine , oxycodone  from pain clinic.  Symptoms are: rapidly worsening.  Triage Disposition: See HCP Within 4 Hours (Or PCP Triage)  Patient/caregiver understands and will follow disposition?: Yes Reason for Disposition  [1] SEVERE pain (e.g., excruciating, unable to use hand at all) AND [2] not improved after 2 hours of pain medicine  Answer Assessment - Initial Assessment Questions Pt reports arthritis is worsening, c/o knuckle/hand pain and edema. States is severe. Also reports upcoming podiatry appt Monday d/t possible right great toe fracture, painful and red.  1. ONSET: When did the pain start?     1+ year, worsening 2. LOCATION: Where is the pain located?     Bilateral hands 3. PAIN: How bad is the pain? (Scale 1-10; or mild, moderate, severe)     Severe 5. CAUSE: What do you think is causing the pain?     Arthritis 6. AGGRAVATING FACTORS: What makes the pain worse? (e.g., using computer)     Gripping, moving 7. OTHER SYMPTOMS: Do you have any other symptoms? (e.g., fever, neck pain, numbness or tingling, rash, swelling)     Swelling  Protocols used: Hand Pain-A-AH  Message from S[place]Daisy W sent at 05/25/2024 10:29 AM EST  Reason for Triage: worsening swelling + severe pain in hands and fingers

## 2024-05-25 NOTE — Progress Notes (Unsigned)
 "  Established Patient Office Visit  Subjective   Patient ID: Lauren Roth, female    DOB: 1945/12/21  Age: 79 y.o. MRN: [id number]  Chief Complaint  Patient presents with   bilateral hand pain     Bilateral hand pain     Discussed the use of AI scribe software for clinical note transcription with the patient, who gave verbal consent to proceed.  History of Present Illness Lauren Roth is a 79 year old female with osteoarthritis and lymphedema who presents with a broken toe and worsening arthritis symptoms.  She sustained a broken toe during icy conditions, experiencing severe pain suggestive of a fracture, though an x-ray has not yet been performed. Her leg is more swollen than usual, which she attributes to her existing lymphedema and other leg conditions.  Her arthritis symptoms have worsened, particularly in her fingers, which have started to turn inward towards her hand, becoming swollen and painful. This development has occurred over the past few weeks. She has a history of osteoarthritis in her hands, and surgery has been recommended but not yet performed. She also experiences numbness in her hand extending up her arm, which she attributes to overuse of her arm with a plate and screws due to arthritis in her shoulder.  She has a history of lymphedema and two other unspecified diseases affecting her legs, contributing to chronic swelling. She did not take her diuretic medication the previous day to avoid excessive fluid loss while out of the house.  Her current pain management includes oxycodone , taken four times a day, and buprenorphine . She has a long history of pain management, having previously been on stronger medications like morphine and fentanyl , but has been gradually reducing her medication use. She has been attending a pain clinic for years.  She has a history of fibromyalgia and is being evaluated for autoimmune conditions. Prednisone  has been effective in  managing her symptoms in the past.    Past Medical History:  Diagnosis Date   Allergic rhinitis 01/16/2009   Qualifier: Diagnosis of  By: Kassie MD, Alyce DELENA Deal of this note might be different from the original. Overview:  Qualifier: Diagnosis of  By: Kassie MD, Alyce A   Arthritis    Chronic migraine w/o aura, not intractable, w/o stat migr    Chronic pain of right knee 06/12/2021   Chronic pain syndrome 07/30/2013   DEGENERATIVE DISC DISEASE, LUMBOSACRAL SPINE 01/16/2009   Qualifier: Diagnosis of  By: Kassie MD, Sean A    Depression    DIAPHORESIS 01/16/2009   Qualifier: Diagnosis of  By: Kassie MD, Sean A    Edema 01/16/2009   Qualifier: Diagnosis of  By: Kassie MD, Alyce DELENA   Formatting of this note might be different from the original. Overview:  Qualifier: Diagnosis of  By: Kassie MD, Sean A   Essential hypertension 01/28/2017   Essential thrombocytosis (HCC) 11/25/2017   Fibromyalgia    GERD 01/16/2009   Qualifier: Diagnosis of  By: Kassie MD, Sean A    Heart failure (HCC) 05/08/2021   HYPERTENSION 01/16/2009   Qualifier: Diagnosis of  By: Kassie MD, Sean A    Hypokalemia 01/16/2009   Qualifier: Diagnosis of  By: Kassie MD, Alyce DELENA   Formatting of this note might be different from the original. Overview:  Qualifier: Diagnosis of  By: Kassie MD, Sean A   Left hip pain 05/24/2019   Lumbosacral spondylosis 10/09/2010   Lymphedema 02/10/2021   Memory loss  01/16/2009   Qualifier: Diagnosis of  By: Kassie MD, Alyce DELENA Deal of this note might be different from the original. Overview:  Qualifier: Diagnosis of  By: Kassie MD, Sean A   Menopausal and postmenopausal disorder 01/16/2009   Qualifier: Diagnosis of  By: Kassie MD, Alyce DELENA Deal of this note might be different from the original. Overview:  Qualifier: Diagnosis of  By: Kassie MD, Sean A   Moderate aortic regurgitation 05/21/2019   Neuropathic pain 09/25/2020   Neuropathy    Obesity (BMI  30.0-34.9) 10/13/2021   Pain in soft tissues of limb 06/20/2012   Formatting of this note might be different from the original. Overview:  IMPRESSION: Left knee pain Formatting of this note might be different from the original. IMPRESSION: Left knee pain   Pain in thoracic spine 07/30/2013   Palpitations 01/28/2017   Primary osteoarthritis of right knee 10/06/2014   Restless legs syndrome 08/25/2010   Shortness of breath on exertion 01/28/2017   SI (sacroiliac) pain 05/10/2016   Thoracic spondylosis 11/20/2010   Overview:  Overview:  IMPRESSION: Thoracic radiculopathy Overview:  IMPRESSION: Thoracic radiculopathy  Formatting of this note might be different from the original. Overview:  IMPRESSION: Thoracic radiculopathy Formatting of this note might be different from the original. IMPRESSION: Thoracic radiculopathy   Thrombocythemia, essential (HCC) 09/12/2013   Thyroid  disease    TMJ (dislocation of temporomandibular joint) 07/30/2013   Tremor 12/29/2021   Unspecified hypothyroidism 01/16/2009   Qualifier: Diagnosis of  By: Kassie MD, Sean A    Uterine cancer (HCC) 07/30/2013    Outpatient Encounter Medications as of 05/25/2024  Medication Sig   acetaminophen  (TYLENOL ) 650 MG CR tablet Take 650 mg by mouth every 8 (eight) hours as needed for pain.   albuterol  (VENTOLIN  HFA) 108 (90 Base) MCG/ACT inhaler Inhale 2 puffs into the lungs every 6 (six) hours as needed for wheezing or shortness of breath.   aspirin  81 MG chewable tablet Chew 81 mg by mouth daily.   azelastine  (OPTIVAR ) 0.05 % ophthalmic solution Place 1 drop into both eyes 2 (two) times daily.   Biotin 10 MG TABS Take by mouth.   buprenorphine  (SUBUTEX ) 2 MG SUBL SL tablet Place 2 mg under the tongue daily.   buPROPion  (WELLBUTRIN  XL) 300 MG 24 hr tablet Take 1 tablet by mouth daily.   Calcium Carb-Cholecalciferol 500-10 MG-MCG TABS Take by mouth.   cefdinir  (OMNICEF ) 300 MG capsule Take 1 capsule (300 mg total) by mouth 2  (two) times daily.   cephALEXin  (KEFLEX ) 500 MG capsule Take 1 capsule (500 mg total) by mouth 2 (two) times daily.   Cholecalciferol (VITAMIN D3) 250 MCG (10000 UT) TABS Take 1 tablet by mouth daily.   diclofenac Sodium (VOLTAREN) 1 % GEL Apply 0.5 g topically 4 (four) times daily.   diltiazem  (CARDIZEM  CD) 120 MG 24 hr capsule Take 120 mg by mouth daily.   donepezil  (ARICEPT ) 10 MG tablet Take 1 tablet by mouth once daily   DULoxetine  (CYMBALTA ) 60 MG capsule Take 60 mg by mouth 2 (two) times daily.   ergocalciferol (VITAMIN D2) 1.25 MG (50000 UT) capsule Take 50,000 Units by mouth once a week.   finasteride  (PROSCAR ) 5 MG tablet Take 5 mg by mouth daily.   fluticasone  (FLONASE ) 50 MCG/ACT nasal spray Place 1 spray into both nostrils daily as needed for allergies or rhinitis.   furosemide  (LASIX ) 20 MG tablet Take 1 tablet (20 mg total) by mouth 2 (  two) times daily.   iron  polysaccharides (FERREX 150) 150 MG capsule Take 1 capsule (150 mg total) by mouth daily.   levothyroxine  (SYNTHROID ) 50 MCG tablet Take 1 tablet by mouth once daily   loratadine (CLARITIN) 10 MG tablet Take 10 mg by mouth daily.   losartan  (COZAAR ) 100 MG tablet Take 1 tablet (100 mg total) by mouth daily.   nitroGLYCERIN  (NITROSTAT ) 0.4 MG SL tablet Place 1 tablet (0.4 mg total) under the tongue as needed for chest pain.   nystatin  (MYCOSTATIN /NYSTOP ) powder Apply 1 Application topically 3 (three) times daily.   omeprazole  (PRILOSEC) 20 MG capsule Take 1 capsule by mouth once daily   oxyCODONE -acetaminophen  (PERCOCET) 10-325 MG tablet Take 1 tablet by mouth 4 (four) times daily as needed.   predniSONE  (DELTASONE ) 20 MG tablet Take 1 tablet (20 mg total) by mouth 2 (two) times daily with a meal.   pregabalin  (LYRICA ) 75 MG capsule TAKE 1 CAPSULE BY MOUTH THREE TIMES DAILY   rOPINIRole  (REQUIP ) 1 MG tablet Take 1 tablet by mouth daily.   thiamine (VITAMIN B1) 100 MG tablet Take by mouth.   topiramate  (TOPAMAX ) 25 MG tablet  Take 1 tablet by mouth twice daily   traZODone  (DESYREL ) 100 MG tablet Take 50-100 mg by mouth at bedtime.   traZODone  (DESYREL ) 50 MG tablet Take 1 at night as needed for sleep   Vibegron  (GEMTESA ) 75 MG TABS Take 1 tablet (75 mg total) by mouth daily.   lidocaine (LIDODERM) 5 % Place onto the skin. (Patient not taking: Reported on 05/08/2024)   Misc Natural Products (FIBER 7 PO) Take by mouth.   mupirocin ointment (BACTROBAN) 2 % 1 Application 2 (two) times daily. (Patient not taking: Reported on 05/08/2024)   propranolol ER (INDERAL LA) 60 MG 24 hr capsule Take 60 mg by mouth daily. (Patient not taking: Reported on 05/08/2024)   No facility-administered encounter medications on file as of 05/25/2024.    Social History[1]  {History (Optional):23778}  ROS    Objective:     BP 128/69 (Cuff Size: Large)   Pulse 92   Temp 98.6 F (37 C) (Oral)   Resp 16   Wt 204 lb 11.2 oz (92.9 kg)   LMP  (LMP Unknown)   SpO2 94%   BMI 36.26 kg/m  {Vitals History (Optional):23777}  Physical Exam   No results found for any visits on 05/25/24.  {Labs (Optional):23779}  The ASCVD Risk score (Arnett DK, et al., 2019) failed to calculate for the following reasons:   Risk score cannot be calculated because patient has a medical history suggesting prior/existing ASCVD   * - Cholesterol units were assumed    Assessment & Plan:   Assessment & Plan Foot pain, right  Orders:   DG Foot Complete Right  Primary osteoarthritis of both hands  Orders:   predniSONE  (DELTASONE ) 20 MG tablet; Take 1 tablet (20 mg total) by mouth 2 (two) times daily with a meal.  Cellulitis of toe of right foot  Orders:   cephALEXin  (KEFLEX ) 500 MG capsule; Take 1 capsule (500 mg total) by mouth 2 (two) times daily.   Assessment and Plan Assessment & Plan Right toe pain with possible fracture and cellulitis Severe right toe pain with possible fracture and cellulitis following trauma from ice and snow. No  prior x-ray performed. No history of gout. Swelling and redness likely due to trauma and irritation. - Ordered foot x-ray to assess for fracture - Prescribed Keflex  for a few days  to address potential cellulitis - Advised wearing a fracture boot for support - Instructed to stay off the toe and baby it - Await x-ray results and follow up with foot doctor on Monday  Primary osteoarthritis of both hands Severe primary osteoarthritis in both hands with significant swelling and deformity of fingers. Recent exacerbation with increased pain and swelling. No trauma reported. Current pain management includes oxycodone  and buprenorphine , with gradual reduction. Prednisone  has been effective in the past for symptom relief. - Prescribed prednisone  40 mg daily for 4 days to manage inflammation and pain - Advised discussing pain management with Doctor Cox or another specialist  Lymphedema of lower extremities Chronic lymphedema of lower extremities with increased swelling. Managed with diuretics, though not taken recently due to fluid output concerns. - Advised resuming diuretic therapy as needed to manage fluid retention     No follow-ups on file.    REDDING PONCE NORLEEN FALCON., MD     [1]  Social History Tobacco Use   Smoking status: Former    Passive exposure: Past   Smokeless tobacco: Never   Tobacco comments:    at age 47  Vaping Use   Vaping status: Never Used  Substance Use Topics   Alcohol use: No   Drug use: No   "

## 2024-05-25 NOTE — Assessment & Plan Note (Addendum)
" °  Orders:   predniSONE  (DELTASONE ) 20 MG tablet; Take 1 tablet (20 mg total) by mouth 2 (two) times daily with a meal.  "

## 2024-06-06 ENCOUNTER — Telehealth

## 2024-06-12 ENCOUNTER — Ambulatory Visit

## 2024-10-29 ENCOUNTER — Other Ambulatory Visit (HOSPITAL_BASED_OUTPATIENT_CLINIC_OR_DEPARTMENT_OTHER): Admitting: Radiology
